# Patient Record
Sex: Female | Born: 1955
Health system: Southern US, Community
[De-identification: ages and names within clinical notes are randomized; demographics above are authoritative.]

## PROBLEM LIST (undated history)

## (undated) DIAGNOSIS — E7800 Pure hypercholesterolemia, unspecified: Secondary | ICD-10-CM

## (undated) DIAGNOSIS — F419 Anxiety disorder, unspecified: Secondary | ICD-10-CM

## (undated) DIAGNOSIS — M25561 Pain in right knee: Secondary | ICD-10-CM

## (undated) DIAGNOSIS — F329 Major depressive disorder, single episode, unspecified: Secondary | ICD-10-CM

## (undated) DIAGNOSIS — C50919 Malignant neoplasm of unspecified site of unspecified female breast: Secondary | ICD-10-CM

## (undated) DIAGNOSIS — F32A Depression, unspecified: Secondary | ICD-10-CM

## (undated) DIAGNOSIS — M1990 Unspecified osteoarthritis, unspecified site: Secondary | ICD-10-CM

## (undated) HISTORY — PX: MASTECTOMY: SHX3

## (undated) HISTORY — PX: BREAST LUMPECTOMY WITH AXILLARY LYMPH NODE BIOPSY: SHX5593

## (undated) HISTORY — DX: Malignant neoplasm of unspecified site of unspecified female breast: C50.919

## (undated) HISTORY — DX: Pain in right knee: M25.561

---

## 2011-12-07 DIAGNOSIS — C50919 Malignant neoplasm of unspecified site of unspecified female breast: Secondary | ICD-10-CM

## 2011-12-07 HISTORY — DX: Malignant neoplasm of unspecified site of unspecified female breast: C50.919

## 2011-12-29 DIAGNOSIS — C50911 Malignant neoplasm of unspecified site of right female breast: Secondary | ICD-10-CM | POA: Insufficient documentation

## 2012-04-10 ENCOUNTER — Telehealth: Payer: Self-pay | Admitting: *Deleted

## 2012-04-10 NOTE — Telephone Encounter (Signed)
Patient has moved her to the [place] and needs to obtain a Med Onc.  Dwaine Gale referred her to [place].  Patient has no insurance and I referred her to call Jerilynn Som for assistance.  Confirmed 05/12/12 appt w/ pt.  Mailed before appt letter & packet to pt.  Took paperwork to Med Rec for chart.

## 2012-04-18 ENCOUNTER — Other Ambulatory Visit: Payer: Self-pay | Admitting: *Deleted

## 2012-04-18 DIAGNOSIS — C50919 Malignant neoplasm of unspecified site of unspecified female breast: Secondary | ICD-10-CM

## 2012-04-29 ENCOUNTER — Encounter: Payer: Self-pay | Admitting: Oncology

## 2012-04-29 NOTE — Progress Notes (Signed)
Patient came in prior to 1st visit. I advised her of medicaid and gave her an application and directions to DSS to turn in the form. 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  We have spelled her name incorrectly in system(Gina Johns).  I advised her would correct to Gina Johns. I also gave her our application also. They have no insurance and is worried about the visit.

## 2012-05-12 ENCOUNTER — Ambulatory Visit (HOSPITAL_BASED_OUTPATIENT_CLINIC_OR_DEPARTMENT_OTHER): Payer: Self-pay | Admitting: Oncology

## 2012-05-12 ENCOUNTER — Other Ambulatory Visit (HOSPITAL_BASED_OUTPATIENT_CLINIC_OR_DEPARTMENT_OTHER): Payer: Self-pay | Admitting: Lab

## 2012-05-12 ENCOUNTER — Ambulatory Visit: Payer: Self-pay

## 2012-05-12 ENCOUNTER — Encounter: Payer: Self-pay | Admitting: Oncology

## 2012-05-12 VITALS — BP 131/85 | HR 76 | Temp 97.8°F | Resp 20 | Ht 64.0 in | Wt 159.8 lb

## 2012-05-12 DIAGNOSIS — Z170 Estrogen receptor positive status [ER+]: Secondary | ICD-10-CM

## 2012-05-12 DIAGNOSIS — IMO0001 Reserved for inherently not codable concepts without codable children: Secondary | ICD-10-CM

## 2012-05-12 DIAGNOSIS — I890 Lymphedema, not elsewhere classified: Secondary | ICD-10-CM

## 2012-05-12 DIAGNOSIS — C50911 Malignant neoplasm of unspecified site of right female breast: Secondary | ICD-10-CM

## 2012-05-12 DIAGNOSIS — C50919 Malignant neoplasm of unspecified site of unspecified female breast: Secondary | ICD-10-CM

## 2012-05-12 LAB — CBC WITH DIFFERENTIAL/PLATELET
Basophils Absolute: 0 10*3/uL (ref 0.0–0.1)
Eosinophils Absolute: 0.1 10*3/uL (ref 0.0–0.5)
HCT: 38.5 % (ref 34.8–46.6)
HGB: 12.9 g/dL (ref 11.6–15.9)
LYMPH%: 23 % (ref 14.0–49.7)
MCV: 77.9 fL — ABNORMAL LOW (ref 79.5–101.0)
MONO#: 0.4 10*3/uL (ref 0.1–0.9)
MONO%: 6 % (ref 0.0–14.0)
NEUT#: 4.8 10*3/uL (ref 1.5–6.5)
NEUT%: 69.6 % (ref 38.4–76.8)
Platelets: 253 10*3/uL (ref 145–400)
RBC: 4.95 10*6/uL (ref 3.70–5.45)
WBC: 6.9 10*3/uL (ref 3.9–10.3)

## 2012-05-12 LAB — COMPREHENSIVE METABOLIC PANEL (CC13)
Alkaline Phosphatase: 70 U/L (ref 40–150)
BUN: 16 mg/dL (ref 7.0–26.0)
CO2: 26 mEq/L (ref 22–29)
Glucose: 114 mg/dl — ABNORMAL HIGH (ref 70–99)
Total Bilirubin: 0.57 mg/dL (ref 0.20–1.20)
Total Protein: 7.2 g/dL (ref 6.4–8.3)

## 2012-05-12 NOTE — Progress Notes (Signed)
Gina Johns [id number] May 30, 1956 56 y.o. 05/12/2012 3:49 PM  CC No primary provider on file. No primary provider on file.  REASON FOR CONSULTATION:  56 year old female with invasive ductal carcinoma of the right breast diagnosed in June 2013 in [place]. Patient is now establishing her care at [facility].   STAGE:  Clinical stage II (T2 N0)  REFERRING PHYSICIAN: Patient is self-referred  HISTORY OF PRESENT ILLNESS:  Gina Johns is a 56 y.o. female.  Without significant past medical history. Patient had her first mammogram one year ago which was normal. Patient travel to [place] over the summer and noted a mass in the right upper quadrant of her right breast. 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 On mammogram the mass measured about 2 x 2 by 1.5 cm. Patient went on to have a lump the mean with axillary lymph node dissection on 11/30/2011. The final pathology revealed a 2.0 cm invasive ductal carcinoma that was grade 2:15 axillary lymph nodes were negative for metastatic disease. Tumor was ER positive PR positive HER-2/neu negative Ki-67 10%. Postoperatively patient did receive adjuvant radiation therapy. She was then begun on letrozole 2.5 mg daily she has been on this now for about 3 months. She did notice right upper extremity lymphedema. Because of this she self referred herself to the lymphedema clinic on [facility]. She currently has a lymphedema sleeve that she wears periodically. Since being on the letrozole she does complain of significant aches and pains especially in her knees in the right. She is having great deal of difficulty kneeling. She is also having mood swings hot flashes. Patient is now seen in oncology for ongoing care   Past Medical History: No past medical history on file.  Past  Surgical History: C-section x 2   Family History: No history of breas,t ovarian, colon  Social History History  Substance Use Topics  . Smoking status: Not on file  . Smokeless tobacco: Not on file  . Alcohol Use: Not on file    Allergies: No Known Allergies  Current Medications: Current Outpatient Prescriptions  Medication Sig Dispense Refill  . letrozole (FEMARA) 2.5 MG tablet Take 2.5 mg by mouth daily.        OB/GYN History:menarache at 15, menopause at 49, G4 P2 first live born at 56, HRT,  Fertility Discussion:N/A Prior History of Cancer: none  Health Maintenance:  Colonoscopy none Bone Density yes  Last PAP smear 3 years Mammogram: first mammogram at 37  ECOG PERFORMANCE STATUS: 0 - Asymptomatic  Genetic Counseling/testing: n/A  REVIEW OF SYSTEMS:  A comprehensive review of systems was negative except for: Integument/breast: positive for Patient's right breast reveals a well-healed incisional scar in the upper outer quadrant the right breast skin does show changes with darkening of the skin there is no nipple discharge no palpable masses the right breast is more swollen in comparison to the left breast there is palpable seroma Musculoskeletal: positive for arthralgias, bone pain, myalgias, stiff joints and Patient has joint stiffness in her right knee. She also is noted to have right upper upper extremity swelling as well. Behavioral/Psych: positive for anxiety and mood swings  PHYSICAL EXAMINATION: Blood pressure 131/85, pulse 76, temperature 97.8 F (36.6 C), temperature source Oral, resp. rate 20, height 5\' 4"  (1.626 m), weight 159 lb 12.8 oz (72.485 kg).  WGN:FAOZH, healthy, no distress, well nourished and well developed SKIN: skin color, texture, turgor are normal, no rashes or significant lesions HEAD: Normocephalic EYES: PERRLA, EOMI EARS: External ears normal OROPHARYNX:no exudate, no erythema and lips, buccal mucosa, and tongue normal  NECK:  supple, no adenopathy, no JVD LYMPH:  no palpable lymphadenopathy, no hepatosplenomegaly BREAST: Right breast reveals well-healed excisional scar seroma is palpable no nipple discharge left breast no masses or nipple discharge. LUNGS: clear to auscultation and percussion HEART: regular rate & rhythm, no murmurs and no gallops ABDOMEN:abdomen soft, non-tender, normal bowel sounds and no masses or organomegaly BACK: Back symmetric, no curvature., No CVA tenderness, Range of motion is normal EXTREMITIES:less then 2 second capillary refill, no edema, no clubbing, no cyanosis  NEURO: alert & oriented x 3 with fluent speech, no focal motor/sensory deficits, gait normal, reflexes normal and symmetric     STUDIES/RESULTS: No results found.   LABS:    Chemistry   No results found for this basename: NA, K, CL, CO2, BUN, CREATININE, GLU   No results found for this basename: CALCIUM, ALKPHOS, AST, ALT, BILITOT      Lab Results  Component Value Date   WBC 6.9 05/12/2012   HGB 12.9 05/12/2012   HCT 38.5 05/12/2012   MCV 77.9* 05/12/2012   PLT 253 05/12/2012         ASSESSMENT    56 year old female with  #1 T T2 N0 invasive ductal carcinoma (stage II) of the right breast status post right lumpectomy with right axillary lymph node dissection. Patient was found to have a 2.0 cm ER positive PR positive HER-2/neu negative breast cancer 15 lymph nodes were negative for metastatic disease. She is now status post adjuvant radiation therapy and then placed on adjuvant antiestrogen therapy with letrozole.  #2 myalgias and arthralgias and mood swings secondary to her antiestrogen therapy.  #3 patient and I had an extensive discussion regarding her diagnosis of breast cancer pathology and treatment that has been given to her. We discussed extensively antiestrogen therapy specifically letrozole and side effects associated with that. I think the myalgias and arthralgias are due to the letrozole as  well as the mood swings and hot flashes. We did discuss the possibility of coming off of it for about 2 weeks. In starting a different agent if her symptoms resolve. However patient is going to [place] and at this time she wants to continue the letrozole.  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 We discussed referring her to the lymphedema clinic but again she is going to [place] and will pursue this referral after she returns.    PLAN:    #1 patient will continue letrozole for now for her aches and pains she will take ibuprofen or Tylenol. If her pains continue she will consulting physician in [place] regarding what to do next.  #2 she is encouraged to continue using her lymphedema sleeve.  #3 I will see her back in January when she returns from her trip.        Thank you so much for allowing me to participate in the care of Dorianne Krupp. I will continue to follow up the patient with you and assist in her care.  All questions were answered. The patient knows to call the clinic with any problems, questions or concerns. We can certainly see the patient much sooner if necessary.  I spent 60 minutes counseling the patient face to face. The total time spent in the appointment  was 60 minutes.   Drue Second, MD Medical/Oncology [facility] [phone] (beeper) [phone] (Office)  05/12/2012, 3:49 PM

## 2012-05-12 NOTE — Patient Instructions (Addendum)
We discussed breast cancer staging and treatment today  You are on femara 2.5 mg daily. We discussed the risks and benefits.  Take vitamin D3 5000iu daily  Take calcium (caltrate) daily Take tyelnol or Ibuprofen for the knee pain  We disucssed lymphedema and referral to the lymphedema clinic upon your return from [place]  I will see you back inJanuary when you return from [place]  Letrozole tablets What is this medicine? LETROZOLE (LET roe zole) blocks the production of estrogen. Certain types of breast cancer grow under the influence of estrogen. Letrozole helps block tumor growth. This medicine is used to treat advanced breast cancer in postmenopausal women. This medicine may be used for other purposes; ask your health care provider or pharmacist if you have questions. What should I tell my health care provider before I take this medicine? They need to know if you have any of these conditions: -liver disease -osteoporosis (weak bones) -an unusual or allergic reaction to letrozole, other medicines, foods, dyes, or preservatives -pregnant or trying to get pregnant -breast-feeding How should I use this medicine? Take this medicine by mouth with a glass of water. You may take it with or without food. Follow the directions on the prescription label. Take your medicine at regular intervals. Do not take your medicine more often than directed. Do not stop taking except on your doctor's advice. Talk to your pediatrician regarding the use of this medicine in children. Special care may be needed. Overdosage: If you think you have taken too much of this medicine contact a poison control center or emergency room at once. NOTE: This medicine is only for you. Do not share this medicine with others. What if I miss a dose? If you miss a dose, take it as soon as you can. If it is almost time for your next dose, take only that dose. Do not take double or extra doses. What may interact with this  medicine? Do not take this medicine with any of the following medications: -estrogens, like hormone replacement therapy or birth control pills This medicine may also interact with the following medications: -dietary supplements such as androstenedione or DHEA -prasterone -tamoxifen This list may not describe all possible interactions. Give your health care provider a list of all the medicines, herbs, non-prescription drugs, or dietary supplements you use. Also tell them if you smoke, drink alcohol, or use illegal drugs. Some items may interact with your medicine. What should I watch for while using this medicine? Visit your doctor or health care professional for regular check-ups to monitor your condition. Do not use this drug if you are pregnant. Serious side effects to an unborn child are possible. Talk to your doctor or pharmacist for more information. You may get drowsy or dizzy. Do not drive, use machinery, or do anything that needs mental alertness until you know how this medicine affects you. Do not stand or sit up quickly, especially if you are an older patient. This reduces the risk of dizzy or fainting spells. What side effects may I notice from receiving this medicine? Side effects that you should report to your doctor or health care professional as soon as possible: -allergic reactions like skin rash, itching, or hives -bone fracture -chest pain -difficulty breathing or shortness of breath -severe pain, swelling, warmth in the leg -unusually weak or tired -vaginal bleeding Side effects that usually do not require medical attention (report to your doctor or health care professional if they continue or are bothersome): -bone, back, joint,  or muscle pain -dizziness -fatigue -fluid retention -headache -hot flashes, night sweats -nausea -weight gain This list may not describe all possible side effects. Call your doctor for medical advice about side effects. You may report side  effects to FDA at 1-800-FDA-1088. Where should I keep my medicine? Keep out of the reach of children. Store between 15 and 30 degrees C (59 and 86 degrees F). Throw away any unused medicine after the expiration date. NOTE: This sheet is a summary. It may not cover all possible information. If you have questions about this medicine, talk to your doctor, pharmacist, or health care provider.  2013, Elsevier/[employer] Standard. (08/22/2007 4:43:44 PM)

## 2012-05-21 ENCOUNTER — Encounter: Payer: Self-pay | Admitting: *Deleted

## 2012-05-21 NOTE — Progress Notes (Signed)
Mailed after appt letter to pt. 

## 2012-05-26 ENCOUNTER — Encounter: Payer: Self-pay | Admitting: Oncology

## 2012-05-26 NOTE — Progress Notes (Signed)
All information as well as application for assistance is no longer valid. She will have to fill out a new application as well as provide all new information. Date of application is 05/12/2012. All banking information is from Oct. All shredded.

## 2012-07-01 ENCOUNTER — Encounter: Payer: Self-pay | Admitting: Oncology

## 2012-07-01 NOTE — Progress Notes (Signed)
Hubby came in and inquiring about getting financial assistance for wife. I gave him application and I made copies of bank statement and her pay stubs. He does not work. He has to get copy of denial from medicaid also. He had form that they withdrew from process. It doesn't say she was denied. I advised him need to call case worker and get that copy to bring back with application.

## 2012-07-02 ENCOUNTER — Telehealth: Payer: Self-pay | Admitting: Oncology

## 2012-07-02 NOTE — Telephone Encounter (Signed)
Per Tami changed 1/22 appt to 9:15am. S/w pt relative re new time.

## 2012-07-14 ENCOUNTER — Telehealth: Payer: Self-pay | Admitting: Oncology

## 2012-07-14 NOTE — Telephone Encounter (Signed)
Pt husband called to cx appt. Husband does not wish to r/s now and states he will call back to r/s.

## 2012-07-16 ENCOUNTER — Ambulatory Visit: Payer: Self-pay | Admitting: Oncology

## 2012-07-21 ENCOUNTER — Encounter: Payer: Self-pay | Admitting: Oncology

## 2012-07-21 NOTE — Progress Notes (Signed)
Processed the patient's financial application. 25family income [id number]. She is 80% indigent 07/21/12-01/18/13. I will send the partial letter and green card to the patient. All has been noted and documents scanned into the system.

## 2012-07-28 ENCOUNTER — Encounter: Payer: Self-pay | Admitting: Oncology

## 2012-07-28 NOTE — Progress Notes (Signed)
Hubby bought back statement from bank, that shows they cashed in the CD and sent to son for college tuition. They want the application for assistance to be reviewed again since bank information has changed. I advised would get reviewed to see if can get more of a discount than 80%.

## 2012-08-14 ENCOUNTER — Encounter: Payer: Self-pay | Admitting: Oncology

## 2012-08-14 NOTE — Progress Notes (Signed)
I re-done her application for assistance. Her and hubby bought back in proof that the CD was cashed for son's tuition. They also sent proof that they are getting assistance in paying their mortgage(1635.00). Previous discount was 80%. With new information I updated to 100% indigent 08/14/12-02/11/13. I will send letter and card to the patient. I emailed the hubby to advise him to make sure he calls and get the bills discounted and to send me the bill for [facility] if they have one, so I can fax to them and get paid.

## 2012-08-20 ENCOUNTER — Telehealth: Payer: Self-pay | Admitting: Oncology

## 2012-08-20 NOTE — Telephone Encounter (Signed)
Pt's husband stopped by to schedule an due to some leg pain pt is having. Pt missed 1/22 appt. No availability - message to KK. Husband aware he will be contacted w/appt. Home number changed per husband and is listed in EPIC.

## 2012-08-26 ENCOUNTER — Other Ambulatory Visit: Payer: Self-pay | Admitting: Medical Oncology

## 2012-08-26 ENCOUNTER — Encounter: Payer: Self-pay | Admitting: Gynecologic Oncology

## 2012-08-26 ENCOUNTER — Emergency Department (INDEPENDENT_AMBULATORY_CARE_PROVIDER_SITE_OTHER)
Admission: EM | Admit: 2012-08-26 | Discharge: 2012-08-26 | Disposition: A | Discharge status: Home / Self Care | Payer: No Typology Code available for payment source | Source: Home / Self Care | Attending: Family Medicine | Admitting: Family Medicine

## 2012-08-26 ENCOUNTER — Ambulatory Visit (HOSPITAL_BASED_OUTPATIENT_CLINIC_OR_DEPARTMENT_OTHER): Payer: No Typology Code available for payment source | Admitting: Gynecologic Oncology

## 2012-08-26 ENCOUNTER — Encounter (HOSPITAL_COMMUNITY): Payer: Self-pay | Admitting: Emergency Medicine

## 2012-08-26 ENCOUNTER — Telehealth: Payer: Self-pay | Admitting: Oncology

## 2012-08-26 VITALS — BP 155/90 | HR 69 | Temp 97.0°F | Wt 159.4 lb

## 2012-08-26 DIAGNOSIS — M25561 Pain in right knee: Secondary | ICD-10-CM

## 2012-08-26 DIAGNOSIS — M25569 Pain in unspecified knee: Secondary | ICD-10-CM

## 2012-08-26 DIAGNOSIS — I890 Lymphedema, not elsewhere classified: Secondary | ICD-10-CM

## 2012-08-26 DIAGNOSIS — C50919 Malignant neoplasm of unspecified site of unspecified female breast: Secondary | ICD-10-CM

## 2012-08-26 DIAGNOSIS — IMO0001 Reserved for inherently not codable concepts without codable children: Secondary | ICD-10-CM

## 2012-08-26 HISTORY — DX: Pain in right knee: M25.561

## 2012-08-26 MED ORDER — DICLOFENAC SODIUM 75 MG PO TBEC: 75.0000 mg | DELAYED_RELEASE_TABLET | Freq: Two times a day (BID) | ORAL | Status: DC

## 2012-08-26 NOTE — ED Notes (Signed)
Pt c/o right knee pain for  Several months that is a constant dull aching pain but over the past several days pain has become more severe.  Pt states that when she was in her home country [place] and xray were done but could not find anything wrong.  Pt has concerns that it is in connection with treatment of breast cancer in 11/2011.

## 2012-08-26 NOTE — Telephone Encounter (Signed)
gv pt appt schedule for June. Per 3/4 pof pt needs appt @ lymphedema clinic. No referral entered. S/w Harriett Sine @ lymphedema clinic and faxed 3/4 pof. Clinic will call pt w/appt. Pt aware and has info for lymphedema clinic.

## 2012-08-26 NOTE — Progress Notes (Signed)
Gina Johns [id number] 1955-07-28 56 y.o. 08/26/2012 7:15 PM  CC No primary Sammie Denner on file.  STAGE:  Clinical stage II (T2 N0)  REFERRING PHYSICIAN: Patient is self-referred  HISTORY OF PRESENT ILLNESS: Gina Johns is a 57 y.o. female without significant past medical history. Patient had her first mammogram two years ago, which was normal.  The patient traveled to [place] over the summer and noted a mass in the right upper quadrant of her right breast. 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  The tumor was ER positive, PR positive, HER-2/neu negative. On mammogram, the mass measured about 2 x 2 by 1.5 cm.  She then went on to have a lumpectomy with axillary lymph node dissection on 11/30/2011. The final pathology revealed a 2.0 cm invasive ductal carcinoma that was grade 2 with axillary lymph nodes negative for metastatic disease. The tumor was ER positive, PR positive, HER-2/neu negative, Ki-67 10%. Postoperatively, the patient did receive adjuvant radiation therapy. She then started letrozole 2.5 mg daily. She did notice right upper extremity lymphedema and she self referred herself to the lymphedema clinic on [facility].   INTERVAL HISTORY: She presents today as a walk in with right knee pain and right upper extremity swelling.  She reports experiencing moderate pain in the right knee that has worsen over the past several months.  She denies injury to the knee or a recent fall.  She reports having an MRI of the right knee in [place] in December 2013 with no significant findings.  She denies swelling or erythema in the right lower extremity.  No issues voiced with the left knee.  She denies trying pharmacologic and non-pharmacologic measures for relief of the right knee pain.  She voices concerns about right upper extremity edema but denies following up with the  recommendation to attend the lymphedema clinic for evaluation.  She currently has a lymphedema sleeve that she wears periodically.  She reports that after her last visit with Dr. Welton Flakes, she stopped taking Letrozole for 6 days to see if the right knee pain would improve and she could not tell a difference so she restarted the medication.     Past Medical History: Past Medical History  Diagnosis Date  . Breast cancer 12/07/2011    right breast 2x3 cm    Past Surgical History: C-section x 2   Family History: No history of breast, ovarian, or colon cancer  Social History History  Substance Use Topics  . Smoking status: Never Smoker   . Smokeless tobacco: Never Used  . Alcohol Use: No    Allergies: No Known Allergies  Current Medications: Current Outpatient Prescriptions  Medication Sig Dispense Refill  . letrozole (FEMARA) 2.5 MG tablet Take 2.5 mg by mouth daily.      . diclofenac (VOLTAREN) 75 MG EC tablet Take 1 tablet (75 mg total) by mouth 2 (two) times daily.  20 tablet  1  . Nutritional Supplements (VITAMIN D MAINTENANCE PO) Take by mouth.       No current facility-administered medications for this visit.    OB/GYN History:menarache at 15, menopause at 89, G4 P2 first live born at 20, HRT,  Fertility Discussion:N/A Prior History of Cancer: none  Health Maintenance: Colonoscopy none Bone Density yes  Last PAP smear 3 years Mammogram: first mammogram at 66  ECOG PERFORMANCE STATUS: Symptomatic but completely ambulatory  Genetic Counseling/testing:  n/A  REVIEW OF SYSTEMS: Constitutional: Feels anxious about moderate right knee pain and right upper extremity edema Musculoskeletal: Positive for right knee pain and right upper extremity edema. Neurologic: No weakness, numbness, or change in gait.  Psychology: No depression or insomnia.  Positive for anxiety.  PHYSICAL EXAMINATION: Blood pressure 155/90, pulse 69, temperature 97 F (36.1 C), temperature source  Oral, weight 159 lb 6 oz (72.292 kg).  GENERAL:  Well developed, well nourished female in no acute distress.  Alert and oriented x3. EXTREMITIES:  RIght knee mildly larger than the left.  Tender on palpation to the left of the patella. No erythema noted.  Mild edema noted posteriorly with the right knee.  Full range of motion with the right knee.  No edema in the lower extremities bilaterally.  Right extremity noted with mild generalized edema.  Slightly larger than the left arm.  Full range of motion noted.  No erythema.  No clubbing or cyanosis.  NEURO: No focal motor/sensory deficits, gait normal, reflexes normal and symmetric  STUDIES/RESULTS: No results found.   LABS:    Chemistry      Component Value Date/Time   NA 139 05/12/2012 1441      Component Value Date/Time   CALCIUM 9.7 05/12/2012 1441      Lab Results  Component Value Date   WBC 6.9 05/12/2012   HGB 12.9 05/12/2012   HCT 38.5 05/12/2012   MCV 77.9* 05/12/2012   PLT 253 05/12/2012    ASSESSMENT    57 year old female with  #1 T T2 N0 invasive ductal carcinoma (stage II) of the right breast status post right lumpectomy with right axillary lymph node dissection. Patient was found to have a 2.0 cm ER positive PR positive HER-2/neu negative breast cancer 15 lymph nodes were negative for metastatic disease. She is now status post adjuvant radiation therapy and then placed on adjuvant antiestrogen therapy with letrozole.  #2 myalgias and arthralgias and mood swings secondary to her antiestrogen therapy.  #3 right knee pain worsening over the past several months.  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     PLAN:   #1  She is encouraged to call [facility] to arrange for a new patient appointment to become established with a primary care Braxtyn Bojarski in order to manage her right knee pain.  The patient will continue letrozole for now and for her aches and pains she will take  ibuprofen or Tylenol.   #2  She is once again advised to follow up at the lymphedema clinic and she is encouraged to continue using her lymphedema sleeve.  #3  She is to follow up with Dr. Welton Flakes in three to four months or sooner if problems arise.   All questions were answered. The patient knows to call the clinic with any problems, questions or concerns. We can certainly see the patient much sooner if necessary.  The patient was reviewed with Dr Welton Flakes, who agrees with the above plan.  Warner Mccreedy, NP Medical/Oncology [facility] [phone] (Office)  08/26/2012, 7:15 PM

## 2012-08-26 NOTE — ED Provider Notes (Signed)
History     CSN: [id number]  Arrival date & time 08/26/12  1532   None     Chief Complaint  Patient presents with  . Knee Pain    right knee pain for several months.     (Consider location/radiation/quality/duration/timing/severity/associated sxs/prior treatment) Patient is a 57 y.o. female presenting with knee pain. The history is provided by the patient. No language interpreter was used.  Knee Pain Location:  Knee Knee location:  R knee Pain details:    Quality:  Aching   Radiates to:  Does not radiate   Severity:  Moderate   Onset quality:  Gradual   Duration:  8 months   Timing:  Constant   Progression:  Worsening Chronicity:  Chronic Worsened by:  Activity Ineffective treatments:  None tried Pt sent here by Dr. Milta Deiters office for referrral to orthopaedist.  Pt has a green card and told she needed to come here to establish for referral  (possibly acute care clinic)  Past Medical History  Diagnosis Date  . Breast cancer 12/07/2011    right breast 2x3 cm    Past Surgical History  Procedure Laterality Date  . Breast lumpectomy with axillary lymph node biopsy      Family History  Problem Relation Age of Onset  . Cancer Sister 24    acute leukemia    History  Substance Use Topics  . Smoking status: Never Smoker   . Smokeless tobacco: Never Used  . Alcohol Use: No    OB History   Grav Para Term Preterm Abortions TAB SAB Ect Mult Living                  Review of Systems  Musculoskeletal: Positive for myalgias and joint swelling.  All other systems reviewed and are negative.    Allergies  Review of patient's allergies indicates no known allergies.  Home Medications   Current Outpatient Rx  Name  Route  Sig  Dispense  Refill  . letrozole (FEMARA) 2.5 MG tablet   Oral   Take 2.5 mg by mouth daily.         . Nutritional Supplements (VITAMIN D MAINTENANCE PO)   Oral   Take by mouth.           BP 131/92  Pulse 69  Temp(Src) 97.4 F  (36.3 C) (Oral)  Resp 16  SpO2 100%  Physical Exam  Vitals reviewed. Constitutional: She appears well-developed and well-nourished.  HENT:  Head: Normocephalic and atraumatic.  Musculoskeletal: She exhibits tenderness.  Small effusion no instability  Neurological: She is alert.  Skin: Skin is warm.    ED Course  Procedures (including critical care time)  Labs Reviewed - No data to display No results found.   1. Right knee pain       MDM  Pt's info given to acute care clinic,  They will make orthopaedist referral        Elson Areas, PA-C 08/26/12 1733  Lonia Skinner [place], [place] 08/26/12 1734

## 2012-08-26 NOTE — Patient Instructions (Signed)
Follow up with your primary care physician about right knee pain and to arrange a possible orthopedic consult.  Plan to see Dr. Welton Flakes in 3 to 4 months and follow up at the lymphedema clinic.  Please call for any questions or concerns.

## 2012-08-27 NOTE — ED Provider Notes (Signed)
Medical screening examination/treatment/procedure(s) were performed by resident physician or non-physician practitioner and as supervising physician I was immediately available for consultation/collaboration.   KINDL,JAMES DOUGLAS MD.   James D Kindl, MD 08/27/12 1827 

## 2012-08-28 ENCOUNTER — Encounter: Payer: Self-pay | Admitting: Sports Medicine

## 2012-08-28 ENCOUNTER — Ambulatory Visit (INDEPENDENT_AMBULATORY_CARE_PROVIDER_SITE_OTHER): Payer: No Typology Code available for payment source | Admitting: Sports Medicine

## 2012-08-28 VITALS — BP 122/78 | HR 72 | Ht 64.0 in | Wt 159.0 lb

## 2012-08-28 DIAGNOSIS — M25569 Pain in unspecified knee: Secondary | ICD-10-CM

## 2012-08-28 DIAGNOSIS — M25561 Pain in right knee: Secondary | ICD-10-CM

## 2012-08-28 DIAGNOSIS — M2240 Chondromalacia patellae, unspecified knee: Secondary | ICD-10-CM

## 2012-08-28 NOTE — Patient Instructions (Addendum)
Please start taking 2000 mg of calcium daily  Ok to use over the counter aspercream instead of diclofenac  Please follow up in 3 weeks  Thank you for seeing [place] today!

## 2012-08-31 NOTE — Progress Notes (Signed)
  Subjective:    Patient ID: Gina Johns, female    DOB: 29-Jun-1955, 57 y.o.   MRN: [id number]  HPI chief complaint: Right knee pain  Very pleasant 57 year old female comes in today complaining of 6 months of right knee pain. No specific injury but rather a gradual onset of pain is along the medial knee. Her symptoms are worse with standing and walking. Intermittent swelling. No mechanical symptoms. She is from [place] and [place] she had an MRI of this right knee. The report is available for review. She's been treated with relative rest and Naprosyn without much improvement. She denies significant problems with this knee in the past. No prior knee surgeries. No groin pain. While in [place] she also had her D3 level checked which was low at 8.02. She has since started supplemental vitamin D. She is here today with her husband.  Medical and surgical history are significant for breast cancer treated in June 2013. She is currently followed by Dr.Khan. Medications include Femara, cholecalciferol powder, vitamin D, ranitidine. She is also using topical Voltaren for her knee pain. No known drug allergies    Review of Systems     Objective:   Physical Exam Well-developed, well-nourished. No acute distress. Awake alert and oriented x3  Right knee: Full range of motion with no effusion. She is tender to palpation along the medial joint line with pain but no popping with McMurray's. No tenderness along the lateral joint line. 1+ patellofemoral crepitus with active extension. Minimal pain with patellar compression testing. Knee is stable to valgus and varus stressing. Negative anterior drawer, negative posterior drawer. I do not appreciate any fullness in the popliteal fossa. She is neurovascularly intact distally. Walks with a minimal limp.  MRI report from [place] dated 06/20/2012 is available for review. Per the report, patient has chondromalacia patella and  degenerative changes in the body and posterior horn of the medial meniscus but no discrete tear. There may also be a small ganglion cyst and a tiny popliteal cyst. Articular cartilage is well preserved.       Assessment & Plan:  1. Right knee pain secondary to degenerated medial meniscus 2. Chondromalacia patella right knee  Although the MRI does not mention any discrete medial meniscal tear, there is always the possibility of an occult degenerative tear even with a "normal "MRI. Nonetheless, it does not change my treatment recommendation at this point in time. I recommended a single intra-articular cortisone injection coupled with [facility]. I also think she would benefit from a body helix compression sleeve. She will followup with me in 3-4 weeks. If symptoms persist and warrant despite conservative treatment we could consider an orthopedic surgical consult down the road for a diagnostic arthroscopy. Patient and her husband are encouraged to call with questions or concerns in the interim. She will continue with Voltaren cream when necessary or alternatively she can use over-the-counter Aspercreme.  Of note, I've also recommended that the patient to 2000 mg of calcium daily in addition to 800 international units of vitamin D.  Consent obtained and verified. Time-out conducted. Noted no overlying erythema, induration, or other signs of local infection. Skin prepped in a sterile fashion. Topical analgesic spray: Ethyl chloride. Joint: right knee Needle: 25g 11/2 inch Completed without difficulty. Meds: 3cc 1% lidocaine, 1cc (40mg ) depomedrol  Advised to call if fevers/chills, erythema, induration, drainage, or persistent bleeding.

## 2012-09-10 ENCOUNTER — Ambulatory Visit: Payer: No Typology Code available for payment source | Attending: Sports Medicine | Admitting: Physical Therapy

## 2012-09-10 DIAGNOSIS — IMO0001 Reserved for inherently not codable concepts without codable children: Secondary | ICD-10-CM | POA: Insufficient documentation

## 2012-09-10 DIAGNOSIS — M25569 Pain in unspecified knee: Secondary | ICD-10-CM | POA: Insufficient documentation

## 2012-09-15 ENCOUNTER — Ambulatory Visit (INDEPENDENT_AMBULATORY_CARE_PROVIDER_SITE_OTHER): Payer: No Typology Code available for payment source | Admitting: Sports Medicine

## 2012-09-15 ENCOUNTER — Encounter: Payer: Self-pay | Admitting: Sports Medicine

## 2012-09-15 VITALS — BP 128/83 | HR 77 | Ht 64.0 in | Wt 159.0 lb

## 2012-09-15 DIAGNOSIS — M25561 Pain in right knee: Secondary | ICD-10-CM

## 2012-09-15 DIAGNOSIS — M25569 Pain in unspecified knee: Secondary | ICD-10-CM

## 2012-09-16 NOTE — Progress Notes (Signed)
  Subjective:    Patient ID: Gina Johns, female    DOB: 1956/05/12, 57 y.o.   MRN: [id number]  HPI Patient comes in today for followup on right knee pain. It should be noted that I incorrectly stated in her last office visit that she received a cortisone injection into this knee. We instead referred her to physical therapy where she has had only a single visit. Still experiencing pain especially with weightbearing. Intermittent swelling.    Review of Systems     Objective:   Physical Exam Full range of motion. Trace effusion. Mild tenderness along medial and lateral joint lines with pain but no popping with McMurray's. Knee is stable to ligamentous exam. Neurovascularly intact distally. Walking with a slight limp.       Assessment & Plan:  1. Right knee pain likely secondary to occult meniscal tear  I once again discussed the merits of an intra-articular cortisone injection but the patient would like to hold on that for now. He would like to try a little more physical therapy. She will return to the office in 3 weeks. She understands that if symptoms persist that we can reconsider a cortisone injection. Call with questions or concerns in the interim.

## 2012-09-17 ENCOUNTER — Ambulatory Visit: Payer: No Typology Code available for payment source | Admitting: Physical Therapy

## 2012-09-18 ENCOUNTER — Encounter: Payer: No Typology Code available for payment source | Admitting: Physical Therapy

## 2012-09-18 ENCOUNTER — Ambulatory Visit: Payer: No Typology Code available for payment source | Admitting: Physical Therapy

## 2012-09-24 ENCOUNTER — Ambulatory Visit: Payer: No Typology Code available for payment source

## 2012-09-24 ENCOUNTER — Ambulatory Visit: Payer: No Typology Code available for payment source | Attending: Sports Medicine | Admitting: Physical Therapy

## 2012-09-24 DIAGNOSIS — M25569 Pain in unspecified knee: Secondary | ICD-10-CM | POA: Insufficient documentation

## 2012-09-24 DIAGNOSIS — IMO0001 Reserved for inherently not codable concepts without codable children: Secondary | ICD-10-CM | POA: Insufficient documentation

## 2012-09-29 ENCOUNTER — Ambulatory Visit: Payer: No Typology Code available for payment source | Admitting: *Deleted

## 2012-09-29 ENCOUNTER — Ambulatory Visit: Payer: No Typology Code available for payment source | Admitting: Physical Therapy

## 2012-10-01 ENCOUNTER — Ambulatory Visit: Payer: No Typology Code available for payment source | Admitting: Physical Therapy

## 2012-10-02 ENCOUNTER — Ambulatory Visit: Payer: No Typology Code available for payment source | Admitting: Sports Medicine

## 2012-10-02 ENCOUNTER — Ambulatory Visit: Payer: No Typology Code available for payment source

## 2012-10-06 ENCOUNTER — Ambulatory Visit: Payer: No Typology Code available for payment source | Admitting: Physical Therapy

## 2012-10-08 ENCOUNTER — Ambulatory Visit: Payer: No Typology Code available for payment source | Admitting: Physical Therapy

## 2012-10-13 ENCOUNTER — Encounter: Payer: Self-pay | Admitting: Physical Therapy

## 2012-10-14 ENCOUNTER — Ambulatory Visit: Payer: No Typology Code available for payment source | Admitting: Physical Therapy

## 2012-10-16 ENCOUNTER — Ambulatory Visit: Payer: No Typology Code available for payment source | Admitting: Physical Therapy

## 2012-10-20 ENCOUNTER — Ambulatory Visit: Payer: No Typology Code available for payment source | Admitting: Physical Therapy

## 2012-10-22 ENCOUNTER — Ambulatory Visit: Payer: No Typology Code available for payment source | Admitting: Physical Therapy

## 2012-10-27 ENCOUNTER — Ambulatory Visit: Payer: No Typology Code available for payment source | Admitting: Sports Medicine

## 2012-10-27 ENCOUNTER — Ambulatory Visit: Payer: No Typology Code available for payment source | Attending: Sports Medicine | Admitting: Physical Therapy

## 2012-10-27 DIAGNOSIS — M25569 Pain in unspecified knee: Secondary | ICD-10-CM | POA: Insufficient documentation

## 2012-10-27 DIAGNOSIS — IMO0001 Reserved for inherently not codable concepts without codable children: Secondary | ICD-10-CM | POA: Insufficient documentation

## 2012-10-29 ENCOUNTER — Ambulatory Visit: Payer: No Typology Code available for payment source | Admitting: Physical Therapy

## 2012-10-30 ENCOUNTER — Ambulatory Visit: Payer: No Typology Code available for payment source | Admitting: Physical Therapy

## 2012-11-04 ENCOUNTER — Ambulatory Visit: Payer: No Typology Code available for payment source | Admitting: Physical Therapy

## 2012-11-06 ENCOUNTER — Ambulatory Visit: Payer: No Typology Code available for payment source | Admitting: Physical Therapy

## 2012-11-11 ENCOUNTER — Ambulatory Visit: Payer: No Typology Code available for payment source | Admitting: Physical Therapy

## 2012-11-12 ENCOUNTER — Ambulatory Visit: Payer: No Typology Code available for payment source | Admitting: Physical Therapy

## 2012-11-13 ENCOUNTER — Encounter: Payer: No Typology Code available for payment source | Admitting: Physical Therapy

## 2012-11-13 ENCOUNTER — Ambulatory Visit: Payer: No Typology Code available for payment source | Admitting: Physical Therapy

## 2012-11-18 ENCOUNTER — Encounter: Payer: No Typology Code available for payment source | Admitting: Physical Therapy

## 2012-11-18 ENCOUNTER — Ambulatory Visit: Payer: No Typology Code available for payment source | Admitting: Physical Therapy

## 2012-11-20 ENCOUNTER — Encounter: Payer: No Typology Code available for payment source | Admitting: Physical Therapy

## 2012-11-21 ENCOUNTER — Ambulatory Visit: Payer: No Typology Code available for payment source

## 2012-11-25 ENCOUNTER — Encounter: Payer: No Typology Code available for payment source | Admitting: Physical Therapy

## 2012-11-27 ENCOUNTER — Encounter: Payer: No Typology Code available for payment source | Admitting: Physical Therapy

## 2012-12-05 ENCOUNTER — Ambulatory Visit: Payer: No Typology Code available for payment source | Attending: Sports Medicine

## 2012-12-05 DIAGNOSIS — IMO0001 Reserved for inherently not codable concepts without codable children: Secondary | ICD-10-CM | POA: Insufficient documentation

## 2012-12-05 DIAGNOSIS — M25569 Pain in unspecified knee: Secondary | ICD-10-CM | POA: Insufficient documentation

## 2012-12-18 ENCOUNTER — Encounter: Payer: Self-pay | Admitting: Oncology

## 2012-12-18 ENCOUNTER — Ambulatory Visit: Payer: No Typology Code available for payment source | Admitting: Lab

## 2012-12-18 ENCOUNTER — Telehealth: Payer: Self-pay | Admitting: Medical Oncology

## 2012-12-18 ENCOUNTER — Other Ambulatory Visit (HOSPITAL_BASED_OUTPATIENT_CLINIC_OR_DEPARTMENT_OTHER): Payer: No Typology Code available for payment source | Admitting: Lab

## 2012-12-18 ENCOUNTER — Ambulatory Visit (HOSPITAL_BASED_OUTPATIENT_CLINIC_OR_DEPARTMENT_OTHER): Payer: No Typology Code available for payment source | Admitting: Oncology

## 2012-12-18 VITALS — BP 119/75 | HR 64 | Temp 97.7°F | Resp 20 | Ht 64.0 in | Wt 156.7 lb

## 2012-12-18 DIAGNOSIS — C50911 Malignant neoplasm of unspecified site of right female breast: Secondary | ICD-10-CM

## 2012-12-18 DIAGNOSIS — C50919 Malignant neoplasm of unspecified site of unspecified female breast: Secondary | ICD-10-CM

## 2012-12-18 LAB — CBC WITH DIFFERENTIAL/PLATELET
Eosinophils Absolute: 0.3 10*3/uL (ref 0.0–0.5)
HCT: 40.5 % (ref 34.8–46.6)
LYMPH%: 36.7 % (ref 14.0–49.7)
MONO#: 0.3 10*3/uL (ref 0.1–0.9)
NEUT#: 3 10*3/uL (ref 1.5–6.5)
NEUT%: 52.1 % (ref 38.4–76.8)
Platelets: 231 10*3/uL (ref 145–400)
RBC: 5.17 10*6/uL (ref 3.70–5.45)
WBC: 5.7 10*3/uL (ref 3.9–10.3)
lymph#: 2.1 10*3/uL (ref 0.9–3.3)

## 2012-12-18 LAB — LIPID PANEL
Cholesterol: 232 mg/dL — ABNORMAL HIGH (ref 0–200)
LDL Cholesterol: 153 mg/dL — ABNORMAL HIGH (ref 0–99)
Total CHOL/HDL Ratio: 5.9 Ratio
VLDL: 40 mg/dL (ref 0–40)

## 2012-12-18 LAB — COMPREHENSIVE METABOLIC PANEL (CC13)
ALT: 15 U/L (ref 0–55)
Albumin: 3.7 g/dL (ref 3.5–5.0)
CO2: 23 mEq/L (ref 22–29)
Calcium: 9.3 mg/dL (ref 8.4–10.4)
Chloride: 110 mEq/L — ABNORMAL HIGH (ref 98–109)
Creatinine: 0.9 mg/dL (ref 0.6–1.1)
Potassium: 4.2 mEq/L (ref 3.5–5.1)

## 2012-12-18 MED ORDER — LETROZOLE 2.5 MG PO TABS: 2.5000 mg | ORAL_TABLET | Freq: Every day | ORAL | Status: DC

## 2012-12-18 NOTE — Patient Instructions (Addendum)
Continue femara daily  Go back to Dr. Margaretha Sheffield for your knee for possible injection  Mammogram ordered   I will see you back in 6 months

## 2012-12-18 NOTE — Progress Notes (Signed)
Patient came in about meds. She signed 600.00 and 400.00 one time grant. I called and left Myra message to call in Femara at [facility] outpatient. I also gave phone for insurance inquires.

## 2012-12-18 NOTE — Telephone Encounter (Signed)
Per Salvatore Marvel, managed care, patient qualifies for a one time grant for Femara and prescription to be called into [facility] outpatient pharmacy. Prescription called in as ordered by Dr Welton Flakes.

## 2012-12-19 ENCOUNTER — Telehealth: Payer: Self-pay | Admitting: Medical Oncology

## 2012-12-19 LAB — VITAMIN D 25 HYDROXY (VIT D DEFICIENCY, FRACTURES): Vit D, 25-Hydroxy: 44 ng/mL (ref 30–89)

## 2012-12-19 NOTE — Progress Notes (Signed)
Quick Note:  Call patient:call patient with results of lipid panel. She needs to see her PCP to prescribe medications ______

## 2012-12-19 NOTE — Telephone Encounter (Signed)
Message copied by Rexene Edison on Fri Dec 19, 2012  4:13 PM ------      Message from: Victorino December      Created: Fri Dec 19, 2012  8:57 AM       Call patient:call patient with results of lipid panel. She needs to see her PCP to prescribe medications ------

## 2012-12-19 NOTE — Telephone Encounter (Signed)
LVMOM. Per MD, informed patient of lipid panel results and for patient to f/u with her PCP to prescribe medications. Patient to call office with any questions or concerns.

## 2012-12-31 ENCOUNTER — Emergency Department (INDEPENDENT_AMBULATORY_CARE_PROVIDER_SITE_OTHER)
Admission: EM | Admit: 2012-12-31 | Discharge: 2012-12-31 | Disposition: A | Discharge status: Home / Self Care | Payer: No Typology Code available for payment source | Source: Home / Self Care | Attending: Family Medicine | Admitting: Family Medicine

## 2012-12-31 ENCOUNTER — Encounter (HOSPITAL_COMMUNITY): Payer: Self-pay | Admitting: Emergency Medicine

## 2012-12-31 ENCOUNTER — Emergency Department (INDEPENDENT_AMBULATORY_CARE_PROVIDER_SITE_OTHER): Payer: No Typology Code available for payment source

## 2012-12-31 DIAGNOSIS — S93409A Sprain of unspecified ligament of unspecified ankle, initial encounter: Secondary | ICD-10-CM

## 2012-12-31 DIAGNOSIS — S93401A Sprain of unspecified ligament of right ankle, initial encounter: Secondary | ICD-10-CM

## 2012-12-31 DIAGNOSIS — J069 Acute upper respiratory infection, unspecified: Secondary | ICD-10-CM

## 2012-12-31 MED ORDER — AZITHROMYCIN 250 MG PO TABS: ORAL_TABLET | ORAL | Status: DC

## 2012-12-31 MED ORDER — GUAIFENESIN-CODEINE 100-10 MG/5ML PO SYRP: 10.0000 mL | ORAL_SOLUTION | Freq: Four times a day (QID) | ORAL | Status: DC | PRN

## 2012-12-31 NOTE — ED Provider Notes (Signed)
History    CSN: [id number] Arrival date & time 12/31/12  1536  First MD Initiated Contact with Patient 12/31/12 1654     Chief Complaint  Patient presents with  . Leg Pain   (Consider location/radiation/quality/duration/timing/severity/associated sxs/prior Treatment) Patient is a 57 y.o. female presenting with leg pain. The history is provided by the patient.  Leg Pain Location:  Ankle Time since incident:  2 days Injury: yes   Mechanism of injury: fall   Fall:    Fall occurred:  Tripped and walking   Impact surface:  Grass Ankle location:  R ankle  Past Medical History  Diagnosis Date  . Breast cancer 12/07/2011    right breast 2x3 cm  . Right knee pain 08/26/2012   Past Surgical History  Procedure Laterality Date  . Breast lumpectomy with axillary lymph node biopsy     Family History  Problem Relation Age of Onset  . Cancer Sister 45    acute leukemia   History  Substance Use Topics  . Smoking status: Never Smoker   . Smokeless tobacco: Never Used  . Alcohol Use: No   OB History   Grav Para Term Preterm Abortions TAB SAB Ect Mult Living                 Review of Systems  Constitutional: Negative.   Musculoskeletal: Negative for myalgias, joint swelling and gait problem.  Skin: Negative.     Allergies  Review of patient's allergies indicates no known allergies.  Home Medications   Current Outpatient Rx  Name  Route  Sig  Dispense  Refill  . Cholecalciferol POWD   Does not apply   60,000 Int'l Units by Does not apply route every 30 (thirty) days.         [name] letrozole (FEMARA) 2.5 MG tablet   Oral   Take 1 tablet (2.5 mg total) by mouth daily.   90 tablet   12   . Nutritional Supplements (VITAMIN D MAINTENANCE PO)   Oral   Take by mouth.         [name] azithromycin (ZITHROMAX Z-PAK) 250 MG tablet      Take as directed on pack   6 each   0   . diclofenac (VOLTAREN) 75 MG EC tablet   Oral   Take 1 tablet (75 mg total) by mouth 2 (two) times  daily.   20 tablet   1   . guaiFENesin-codeine (ROBITUSSIN AC) 100-10 MG/5ML syrup   Oral   Take 10 mLs by mouth 4 (four) times daily as needed for cough.   180 mL   0   . RANITIDINE HCL PO   Oral   Take 50 mg by mouth as needed.          BP 125/79  Pulse 66  Temp(Src) 97.5 F (36.4 C) (Oral)  Resp 16  SpO2 99% Physical Exam  Nursing note and vitals reviewed. Constitutional: She is oriented to person, place, and time. She appears well-developed and well-nourished.  Musculoskeletal: She exhibits tenderness.       Right ankle: She exhibits swelling. She exhibits normal range of motion, no ecchymosis and no deformity. Tenderness. Lateral malleolus tenderness found. No medial malleolus and no proximal fibula tenderness found. Achilles tendon normal.  Neurological: She is alert and oriented to person, place, and time.    ED Course  Procedures (including critical care time) Labs Reviewed - No data to display Dg Ankle Complete Right  12/31/2012   *  RADIOLOGY REPORT*  Clinical Data: Larey Seat 2 days ago.  Lateral ankle pain.  RIGHT ANKLE - COMPLETE 3+ VIEW  Comparison: None.  Findings: Soft tissue swelling lateral malleolar region.  No underlying fracture or dislocation.  Ossific structure near the lateral malleolus is well corticated and felt not to represent result of acute fracture.  Plantar spur.  Bony overgrowth anterior inferior aspect of the tibia probably related to degenerative changes.  Small joint effusion not excluded.  IMPRESSION: Soft tissue swelling lateral malleolar region without underlying fracture or dislocation.  Please see above.   Original Report Authenticated By: Lacy Duverney, M.D.   1. Ankle sprain, right, initial encounter   2. URI (upper respiratory infection)     MDM  X-rays reviewed and report per radiologist.   Linna Hoff, MD 12/31/12 [phone]

## 2012-12-31 NOTE — ED Notes (Signed)
Day before yesterday patient fell on her right leg and now she has some swelling on her ankle.  Patient states a productive cough with yellowish mucus.  Nyquil was taken but only little relief.

## 2013-01-02 ENCOUNTER — Other Ambulatory Visit: Payer: No Typology Code available for payment source

## 2013-01-04 NOTE — Progress Notes (Signed)
OFFICE PROGRESS NOTE  CC  No PCP Per Patient [address] [place] [address]  DIAGNOSIS: 57 year old female with invasive ductal carcinoma of the right breast diagnosed in June 2013 in [place].  STAGE:  Clinical stage II (T2 N0)   PRIOR THERAPY: #1Patient had her first mammogram one year ago which was normal. Patient travel to [place] over the summer and noted a mass in the right upper quadrant of her right breast. 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 On mammogram the mass measured about 2 x 2 by 1.5 cm.   #2 Patient went on to have a lump the mean with axillary lymph node dissection on 11/30/2011. The final pathology revealed a 2.0 cm invasive ductal carcinoma that was grade 2:15 axillary lymph nodes were negative for metastatic disease. Tumor was ER positive PR positive HER-2/neu negative Ki-67 10%. Postoperatively patient did receive adjuvant radiation therapy.   #3She was then begun on letrozole 2.5 mg daily she has been on this now for about 3 months   CURRENT THERAPY:letrozole 2.5 mg daily  INTERVAL HISTORY: [name] 57 y.o. female returns for followup visit today. She was in [place] for the last 6 months or so. She has now returned. Her main complaint is her right upper extremity. she also notes ongoing aches and pains in her knees. Patient is still very concerned about her cholesterol and she would like to have a cholesterol check.she otherwise does have some hot flashes and mood swings. Remainder of the 10 point review of systems is negative.  MEDICAL HISTORY: Past Medical History  Diagnosis Date  . Breast cancer 12/07/2011    right breast 2x3 cm  . Right knee pain 08/26/2012    ALLERGIES:  has No Known Allergies.  MEDICATIONS:  Current Outpatient Prescriptions  Medication Sig Dispense Refill   . Nutritional Supplements (VITAMIN D MAINTENANCE PO) Take by mouth.      [name] azithromycin (ZITHROMAX Z-PAK) 250 MG tablet Take as directed on pack  6 each  0  . Cholecalciferol POWD 60,000 Int'l Units by Does not apply route every 30 (thirty) days.      . diclofenac (VOLTAREN) 75 MG EC tablet Take 1 tablet (75 mg total) by mouth 2 (two) times daily.  20 tablet  1  . guaiFENesin-codeine (ROBITUSSIN AC) 100-10 MG/5ML syrup Take 10 mLs by mouth 4 (four) times daily as needed for cough.  180 mL  0  . letrozole (FEMARA) 2.5 MG tablet Take 1 tablet (2.5 mg total) by mouth daily.  90 tablet  12  . RANITIDINE HCL PO Take 50 mg by mouth as needed.       No current facility-administered medications for this visit.    SURGICAL HISTORY:  Past Surgical History  Procedure Laterality Date  . Breast lumpectomy with axillary lymph node biopsy      REVIEW OF SYSTEMS:  Pertinent items are noted in HPI.   HEALTH MAINTENANCE:   PHYSICAL EXAMINATION: Blood pressure 119/75, pulse 64, temperature 97.7 F (36.5 C), temperature source Oral, resp. rate 20, height 5\' 4"  (1.626 m), weight 156 lb 11.2 oz (71.079 kg). Body mass index is 26.88 kg/(m^2). ECOG PERFORMANCE STATUS: 0 - Asymptomatic   General appearance: alert, cooperative and appears stated age Resp: clear to auscultation bilaterally Cardio: regular rate and rhythm GI: soft, non-tender; bowel  sounds normal; no masses,  no organomegaly Extremities: extremities normal, atraumatic, no cyanosis or edema Neurologic: Grossly normal Left breast no masses or nipple discharge. Right breast reveals healing surgical scar no nodularity no nipple discharge no masses.  LABORATORY DATA: Lab Results  Component Value Date   WBC 5.7 12/18/2012   HGB 13.2 12/18/2012   HCT 40.5 12/18/2012   MCV 78.3* 12/18/2012   PLT 231 12/18/2012      Chemistry      Component Value Date/Time   NA 140 12/18/2012 0958   K 4.2 12/18/2012 0958   CL 107 05/12/2012 1441   CO2 23  12/18/2012 0958   BUN 10.9 12/18/2012 0958   CREATININE 0.9 12/18/2012 0958      Component Value Date/Time   CALCIUM 9.3 12/18/2012 0958   ALKPHOS 68 12/18/2012 0958   AST 14 12/18/2012 0958   ALT 15 12/18/2012 0958   BILITOT 0.52 12/18/2012 0958       RADIOGRAPHIC STUDIES:  Dg Ankle Complete Right  12/31/2012   *RADIOLOGY REPORT*  Clinical Data: Larey Seat 2 days ago.  Lateral ankle pain.  RIGHT ANKLE - COMPLETE 3+ VIEW  Comparison: None.  Findings: Soft tissue swelling lateral malleolar region.  No underlying fracture or dislocation.  Ossific structure near the lateral malleolus is well corticated and felt not to represent result of acute fracture.  Plantar spur.  Bony overgrowth anterior inferior aspect of the tibia probably related to degenerative changes.  Small joint effusion not excluded.  IMPRESSION: Soft tissue swelling lateral malleolar region without underlying fracture or dislocation.  Please see above.   Original Report Authenticated By: Lacy Duverney, M.D.    ASSESSMENT: 57 year old female with  #1 T T2 N0 invasive ductal carcinoma (stage II) of the right breast status post right lumpectomy with right axillary lymph node dissection. Patient was found to have a 2.0 cm ER positive PR positive HER-2/neu negative breast cancer 15 lymph nodes were negative for metastatic disease. She is now status post adjuvant radiation therapy and then placed on adjuvant antiestrogen therapy with letrozole.   #2 myalgias and arthralgias and mood swings secondary to her antiestrogen therapy.   #3 patient and I had an extensive discussion regarding her diagnosis of breast cancer pathology and treatment that has been given to her. We discussed extensively antiestrogen therapy specifically letrozole and side effects associated with that. I think the myalgias and arthralgias are due to the letrozole as well as the mood swings and hot flashes. We did discuss the possibility of coming off of it for about 2 weeks. In  starting a different agent if her symptoms resolve. However patient is going to [place] and at this time she wants to continue the letrozole.   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 We discussed referring her to the lymphedema clinic     PLAN:  #1Continue femara daily  #2 Go back to Dr. Margaretha Sheffield for your knee for possible injection  #3 Mammogram ordered   #4 I will see you back in 6 months  #5 I ordered a cholesterol check for her as well as and she is on Femara and is at risk for hypercholesterolemia   All questions were answered. The patient knows to call the clinic with any problems, questions or concerns. We can certainly see the patient much sooner if necessary.  I spent 25 minutes counseling the patient face to face. The total time spent in the appointment was 30 minutes.  Marcy Panning, MD Medical/Oncology [facility] [phone] (beeper) [phone] (Office)

## 2013-01-05 ENCOUNTER — Other Ambulatory Visit: Payer: Self-pay | Admitting: *Deleted

## 2013-01-05 DIAGNOSIS — Z853 Personal history of malignant neoplasm of breast: Secondary | ICD-10-CM

## 2013-01-06 ENCOUNTER — Ambulatory Visit (HOSPITAL_COMMUNITY): Payer: No Typology Code available for payment source

## 2013-01-09 ENCOUNTER — Ambulatory Visit: Payer: No Typology Code available for payment source | Admitting: Sports Medicine

## 2013-01-19 ENCOUNTER — Ambulatory Visit (INDEPENDENT_AMBULATORY_CARE_PROVIDER_SITE_OTHER): Payer: No Typology Code available for payment source | Admitting: Sports Medicine

## 2013-01-19 VITALS — BP 112/73 | Ht 64.0 in | Wt 159.0 lb

## 2013-01-19 DIAGNOSIS — M2241 Chondromalacia patellae, right knee: Secondary | ICD-10-CM

## 2013-01-19 DIAGNOSIS — M25569 Pain in unspecified knee: Secondary | ICD-10-CM

## 2013-01-19 DIAGNOSIS — M2240 Chondromalacia patellae, unspecified knee: Secondary | ICD-10-CM

## 2013-01-19 DIAGNOSIS — M25561 Pain in right knee: Secondary | ICD-10-CM

## 2013-01-19 MED ORDER — METHYLPREDNISOLONE ACETATE 40 MG/ML IJ SUSP
40.0000 mg | Freq: Once | INTRAMUSCULAR | Status: AC
  Administered 2013-01-19: 40 mg via INTRA_ARTICULAR

## 2013-01-20 NOTE — Progress Notes (Signed)
  Subjective:    Patient ID: Gina Johns, female    DOB: [birth year], 57 y.o.   MRN: [id number]  HPI Patient comes in today for followup on right knee pain. She would like to proceed with an intra-articular cortisone injection. Her knee is feeling better but is not completely pain-free. She admits that she has not been doing her home exercises as she should. No swelling. No giving way. No mechanical symptoms. Main discomfort is with going up and down stairs. She is here today with her husband.    Review of Systems     Objective:   Physical Exam Well-developed, well-nourished. No acute distress.  Right knee: Full range of motion. No effusion. 1+ patellofemoral crepitus. Mild tenderness to palpation along the medial joint line but a negative McMurray's. No tenderness along the lateral joint line. Knee is stable to ligamentous exam. Neurovascular intact distally. Walking without significant limp.       Assessment & Plan:  1. Right knee pain secondary to chondromalacia patella  Patient's right knee was injected with cortisone injection today. An anterior medial approach was utilized. Patient tolerated the procedure without difficulty. I've also recommended that she try 1500 mg of glucosamine for the next 6 weeks. She understands the importance of continuing with her home exercises. This point we will leave things open ended for her to followup when necessary.  Consent obtained and verified. Time-out conducted. Noted no overlying erythema, induration, or other signs of local infection. Skin prepped in a sterile fashion. Topical analgesic spray: Ethyl chloride. Joint: right knee Needle: 25g 1 1/2 inch Completed without difficulty. Meds: 3cc 1% xylocaine, 1cc (40mg ) depomedrol  Advised to call if fevers/chills, erythema, induration, drainage, or persistent bleeding.

## 2013-01-22 ENCOUNTER — Ambulatory Visit: Payer: No Typology Code available for payment source | Attending: Family Medicine | Admitting: Family Medicine

## 2013-01-22 ENCOUNTER — Encounter: Payer: Self-pay | Admitting: Family Medicine

## 2013-01-22 VITALS — BP 122/83 | HR 70 | Temp 99.0°F | Ht 63.5 in | Wt 158.2 lb

## 2013-01-22 DIAGNOSIS — C50919 Malignant neoplasm of unspecified site of unspecified female breast: Secondary | ICD-10-CM | POA: Insufficient documentation

## 2013-01-22 DIAGNOSIS — M25569 Pain in unspecified knee: Secondary | ICD-10-CM

## 2013-01-22 DIAGNOSIS — C50911 Malignant neoplasm of unspecified site of right female breast: Secondary | ICD-10-CM

## 2013-01-22 DIAGNOSIS — M25561 Pain in right knee: Secondary | ICD-10-CM

## 2013-01-22 DIAGNOSIS — E785 Hyperlipidemia, unspecified: Secondary | ICD-10-CM

## 2013-01-22 DIAGNOSIS — H269 Unspecified cataract: Secondary | ICD-10-CM

## 2013-01-22 MED ORDER — PRAVASTATIN SODIUM 20 MG PO TABS: 20.0000 mg | ORAL_TABLET | Freq: Every day | ORAL | Status: DC

## 2013-01-22 MED ORDER — PRAVASTATIN SODIUM 40 MG PO TABS: 40.0000 mg | ORAL_TABLET | Freq: Every day | ORAL | Status: DC

## 2013-01-22 NOTE — Progress Notes (Deleted)
Patient ID: Gina Johns, female   DOB: 1955-07-03, 57 y.o.   MRN: [id number]  CC:  Establish Care  HPI: Patient has Right breast cancer along with lymph node involvement treated surgically  June 2013.  Currently on Femra, and patient seens Dr.  Welton Flakes and will see him again December, currently follows up every 6 months. Lab work with Dr.  Welton Flakes shows hyperlipidemia, and was referred for management of that.   No Known Allergies Past Medical History  Diagnosis Date  . Breast cancer 12/07/2011    right breast 2x3 cm  . Right knee pain 08/26/2012   Current Outpatient Prescriptions on File Prior to Visit  Medication Sig Dispense Refill  . azithromycin (ZITHROMAX Z-PAK) 250 MG tablet Take as directed on pack  6 each  0  . Cholecalciferol POWD 60,000 Int'l Units by Does not apply route every 30 (thirty) days.      [name] guaiFENesin-codeine (ROBITUSSIN AC) 100-10 MG/5ML syrup Take 10 mLs by mouth 4 (four) times daily as needed for cough.  180 mL  0  . letrozole (FEMARA) 2.5 MG tablet Take 1 tablet (2.5 mg total) by mouth daily.  90 tablet  12  . Nutritional Supplements (VITAMIN D MAINTENANCE PO) Take by mouth.      [name] RANITIDINE HCL PO Take 50 mg by mouth as needed.      . diclofenac (VOLTAREN) 75 MG EC tablet Take 1 tablet (75 mg total) by mouth 2 (two) times daily.  20 tablet  1   No current facility-administered medications on file prior to visit.   Family History  Problem Relation Age of Onset  . Cancer Sister 109    acute leukemia   History   Social History  . Marital Status: Married    Spouse Name: N/A    Number of Children: N/A  . Years of Education: N/A   Occupational History  . Not on file.   Social History Main Topics  . Smoking status: Never Smoker   . Smokeless tobacco: Never Used  . Alcohol Use: No  . Drug Use: No  . Sexually Active: Yes   Other Topics Concern  . Not on file   Social History Narrative  . No narrative on file    Review of Systems  ______ Constitutional: Negative for fever, chills, diaphoresis, activity change, appetite change and fatigue. ____ HENT: Negative for ear pain, nosebleeds, congestion, facial swelling, rhinorrhea, neck pain, neck stiffness and ear discharge.  ____ Eyes: Negative for pain, discharge, redness, itching and visual disturbance. ____ Respiratory: Negative for cough, choking, chest tightness, shortness of breath, wheezing and stridor.  ____ Cardiovascular: Negative for chest pain, palpitations and leg swelling. ____ Gastrointestinal: Negative for abdominal distention. ____ Genitourinary: Negative for dysuria, urgency, frequency, hematuria, flank pain, decreased urine volume, difficulty urinating and dyspareunia. ____ Musculoskeletal: Negative for back pain, joint swelling, arthralgias and gait problem. ________ Neurological: Negative for dizziness, tremors, seizures, syncope, facial asymmetry, speech difficulty, weakness, light-headedness, numbness and headaches. ____ Hematological: Negative for adenopathy. Does not bruise/bleed easily. ____ Psychiatric/Behavioral: Negative for hallucinations, behavioral problems, confusion, dysphoric mood, decreased concentration and agitation. ______   Objective:   Filed Vitals:   01/22/13 1128  BP: 122/83  Pulse: 70  Temp: 99 F (37.2 C)    Physical Exam ______ Constitutional: Appears well-developed and well-nourished. No distress. ____ HENT: Normocephalic. External right and left ear normal. Oropharynx is clear and moist. ____ Eyes: Conjunctivae and EOM are normal. PERRLA, no scleral icterus. ____ Neck: Normal  ROM. Neck supple. No JVD. No tracheal deviation. No thyromegaly. ____ CVS: RRR, S1/S2 +, no murmurs, no gallops, no carotid bruit.  Pulmonary: Effort and breath sounds normal, no stridor, rhonchi, wheezes, rales.  Abdominal: Soft. BS +,  no distension, tenderness, rebound or guarding. ________ Musculoskeletal: Normal range of motion. No edema and  no tenderness. ____ Lymphadenopathy: No lymphadenopathy noted, cervical, inguinal. Neuro: Alert. Normal reflexes, muscle tone coordination. No cranial nerve deficit. Skin: Skin is warm and dry. No rash noted. Not diaphoretic. No erythema. No pallor. ____ Psychiatric: Normal mood and affect. Behavior, judgment, thought content normal. __  Lab Results  Component Value Date   WBC 5.7 12/18/2012   HGB 13.2 12/18/2012   HCT 40.5 12/18/2012   MCV 78.3* 12/18/2012   PLT 231 12/18/2012   Lab Results  Component Value Date   CREATININE 0.9 12/18/2012   BUN 10.9 12/18/2012   NA 140 12/18/2012   K 4.2 12/18/2012   CL 107 05/12/2012   CO2 23 12/18/2012    No results found for this basename: HGBA1C   Lipid Panel     Component Value Date/Time   CHOL 232* 12/18/2012 1216   TRIG 201* 12/18/2012 1216   HDL 39* 12/18/2012 1216   CHOLHDL 5.9 12/18/2012 1216   VLDL 40 12/18/2012 1216   LDLCALC 153* 12/18/2012 1216       Assessment and plan:   Patient Active Problem List   Diagnosis Date Noted  . Right knee pain 08/26/2012  . Breast cancer, right breast 12/29/2011

## 2013-01-22 NOTE — Progress Notes (Signed)
Patient ID: Gina Johns, female   DOB: [birth year], 57 y.o.   MRN: [id number]  CC:  Establish Care  HPI: Patient has right breast cancer along with lymph node involvement treated surgically June 2013. Currently on Femara, and patient seens Dr. Welton Flakes and will see him again December. Currently follows up every 6 months with Welton Flakes. Lab work with Dr. Welton Flakes shows hyperlipidemia, and was referred for management of that. 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   No Known Allergies Past Medical History  Diagnosis Date  . Breast cancer 12/07/2011    right breast 2x3 cm  . Right knee pain 08/26/2012   Current Outpatient Prescriptions on File Prior to Visit  Medication Sig Dispense Refill  . azithromycin (ZITHROMAX Z-PAK) 250 MG tablet Take as directed on pack  6 each  0  . Cholecalciferol POWD 60,000 Int'l Units by Does not apply route every 30 (thirty) days.      [name] guaiFENesin-codeine (ROBITUSSIN AC) 100-10 MG/5ML syrup Take 10 mLs by mouth 4 (four) times daily as needed for cough.  180 mL  0  . letrozole (FEMARA) 2.5 MG tablet Take 1 tablet (2.5 mg total) by mouth daily.  90 tablet  12  . Nutritional Supplements (VITAMIN D MAINTENANCE PO) Take by mouth.      [name] RANITIDINE HCL PO Take 50 mg by mouth as needed.      . diclofenac (VOLTAREN) 75 MG EC tablet Take 1 tablet (75 mg total) by mouth 2 (two) times daily.  20 tablet  1   No current facility-administered medications on file prior to visit.   Family History  Problem Relation Age of Onset  . Cancer Sister 43    acute leukemia   History   Social History  . Marital Status: Married    Spouse Name: N/A    Number of Children: N/A  . Years of Education: N/A   Occupational History  . Not on file.   Social History Main Topics  . Smoking status: Never Smoker   . Smokeless tobacco: Never Used  . Alcohol Use: No  . Drug Use: No  . Sexually Active: Yes   Other Topics Concern  . Not on file   Social  History Narrative  . No narrative on file    Review of Systems ______ Constitutional: Negative for fever, chills, diaphoresis, activity change, appetite change and fatigue. ____ HENT: Negative for ear pain, nosebleeds, congestion, facial swelling, rhinorrhea, neck pain, neck stiffness and ear discharge.  ____ Eyes: Negative for pain, discharge, redness, itching and visual disturbance. ____ Respiratory: Negative for cough, choking, chest tightness, shortness of breath, wheezing and stridor.  ____ Cardiovascular: Negative for chest pain, palpitations and leg swelling. ____ Gastrointestinal: Negative for abdominal distention. ____ Genitourinary: Negative for dysuria, urgency, frequency, hematuria, flank pain, decreased urine volume, difficulty urinating and dyspareunia. ____ Musculoskeletal: Negative for back pain, joint swelling, arthralgias and gait problem. ________ Neurological: Negative for dizziness, tremors, seizures, syncope, facial asymmetry, speech difficulty, weakness, light-headedness, numbness and headaches. ____ Hematological: Negative for adenopathy. Does not bruise/bleed easily. ____ Psychiatric/Behavioral: Negative for hallucinations, behavioral problems, confusion, dysphoric mood, decreased concentration and agitation. ______   Objective:   Filed Vitals:   01/22/13 1128  BP: 122/83  Pulse: 70  Temp: 99 F (37.2 C)    Physical Exam ______ Constitutional: Appears well-developed and well-nourished. No distress. ____ HENT: Normocephalic. External right and left ear normal. Oropharynx is clear and moist. ____ Eyes: Conjunctivae and EOM  are normal. PERRLA, no scleral icterus. ____ Neck: Normal ROM. Neck supple. No JVD. No tracheal deviation. No thyromegaly. ____ CVS: RRR, S1/S2 +, no murmurs, no gallops, no carotid bruit.  Pulmonary: Effort and breath sounds normal, no stridor, rhonchi, wheezes, rales.  Abdominal: Soft. BS +,  no distension, tenderness, rebound or  guarding. ________ Musculoskeletal: Normal range of motion. No edema and no tenderness. ____ Lymphadenopathy: No lymphadenopathy noted, cervical, inguinal. Neuro: Alert. Normal reflexes, muscle tone coordination. No cranial nerve deficit. Skin: Skin is warm and dry. No rash noted. Not diaphoretic. No erythema. No pallor. ____ Psychiatric: Normal mood and affect. Behavior, judgment, thought content normal. __  Lab Results  Component Value Date   WBC 5.7 12/18/2012   HGB 13.2 12/18/2012   HCT 40.5 12/18/2012   MCV 78.3* 12/18/2012   PLT 231 12/18/2012   Lab Results  Component Value Date   CREATININE 0.9 12/18/2012   BUN 10.9 12/18/2012   NA 140 12/18/2012   K 4.2 12/18/2012   CL 107 05/12/2012   CO2 23 12/18/2012    No results found for this basename: HGBA1C   Lipid Panel     Component Value Date/Time   CHOL 232* 12/18/2012 1216   TRIG 201* 12/18/2012 1216   HDL 39* 12/18/2012 1216   CHOLHDL 5.9 12/18/2012 1216   VLDL 40 12/18/2012 1216   LDLCALC 153* 12/18/2012 1216       Assessment and plan:   Patient Active Problem List   Diagnosis Date Noted  . Right knee pain 08/26/2012  . Breast cancer, right breast 12/29/2011   Pt was referred for eye exam   Pt was started on low dose pravastatin 20 mg po daily  RTC in 2 months to check lipids  Reviewed labs with patient  The patient was given clear instructions to go to ER or return to medical center if symptoms don't improve, worsen or new problems develop.  The patient verbalized understanding.  The patient was told to call to get any lab results if not heard anything in the next week.    Rodney Langton, MD, CDE, FAAFP [facility] [place], [place]

## 2013-01-22 NOTE — Patient Instructions (Addendum)

## 2013-01-22 NOTE — Progress Notes (Deleted)
Patient ID: Gina Johns, female   DOB: 03/29/1956, 57 y.o.   MRN: [id number]  CC:   HPI:  No Known Allergies Past Medical History  Diagnosis Date  . Breast cancer 12/07/2011    right breast 2x3 cm  . Right knee pain 08/26/2012   Current Outpatient Prescriptions on File Prior to Visit  Medication Sig Dispense Refill  . azithromycin (ZITHROMAX Z-PAK) 250 MG tablet Take as directed on pack  6 each  0  . Cholecalciferol POWD 60,000 Int'l Units by Does not apply route every 30 (thirty) days.      [name] guaiFENesin-codeine (ROBITUSSIN AC) 100-10 MG/5ML syrup Take 10 mLs by mouth 4 (four) times daily as needed for cough.  180 mL  0  . letrozole (FEMARA) 2.5 MG tablet Take 1 tablet (2.5 mg total) by mouth daily.  90 tablet  12  . Nutritional Supplements (VITAMIN D MAINTENANCE PO) Take by mouth.      [name] RANITIDINE HCL PO Take 50 mg by mouth as needed.      . diclofenac (VOLTAREN) 75 MG EC tablet Take 1 tablet (75 mg total) by mouth 2 (two) times daily.  20 tablet  1   No current facility-administered medications on file prior to visit.   Family History  Problem Relation Age of Onset  . Cancer Sister 68    acute leukemia   History   Social History  . Marital Status: Married    Spouse Name: N/A    Number of Children: N/A  . Years of Education: N/A   Occupational History  . Not on file.   Social History Main Topics  . Smoking status: Never Smoker   . Smokeless tobacco: Never Used  . Alcohol Use: No  . Drug Use: No  . Sexually Active: Yes   Other Topics Concern  . Not on file   Social History Narrative  . No narrative on file    Review of Systems  Constitutional: Negative for fever, chills, diaphoresis, activity change, appetite change and fatigue.  HENT: Negative for ear pain, nosebleeds, congestion, facial swelling, rhinorrhea, neck pain, neck stiffness and ear discharge.   Eyes: Negative for pain, discharge, redness, itching and visual disturbance.  Respiratory: Negative  for cough, choking, chest tightness, shortness of breath, wheezing and stridor.   Cardiovascular: Negative for chest pain, palpitations and leg swelling.  Gastrointestinal: Negative for abdominal distention.  Genitourinary: Negative for dysuria, urgency, frequency, hematuria, flank pain, decreased urine volume, difficulty urinating and dyspareunia.  Musculoskeletal: Negative for back pain, joint swelling, arthralgias and gait problem.  Neurological: Negative for dizziness, tremors, seizures, syncope, facial asymmetry, speech difficulty, weakness, light-headedness, numbness and headaches.  Hematological: Negative for adenopathy. Does not bruise/bleed easily.  Psychiatric/Behavioral: Negative for hallucinations, behavioral problems, confusion, dysphoric mood, decreased concentration and agitation.    Objective:   Filed Vitals:   01/22/13 1128  BP: 122/83  Pulse: 70  Temp: 99 F (37.2 C)    Physical Exam  Constitutional: Appears well-developed and well-nourished. No distress.  HENT: Normocephalic. External right and left ear normal. Oropharynx is clear and moist.  Eyes: Conjunctivae and EOM are normal. PERRLA, no scleral icterus.  Neck: Normal ROM. Neck supple. No JVD. No tracheal deviation. No thyromegaly.  CVS: RRR, S1/S2 +, no murmurs, no gallops, no carotid bruit.  Pulmonary: Effort and breath sounds normal, no stridor, rhonchi, wheezes, rales.  Abdominal: Soft. BS +,  no distension, tenderness, rebound or guarding.  Musculoskeletal: Normal range of motion. No edema and  no tenderness.  Lymphadenopathy: No lymphadenopathy noted, cervical, inguinal. Neuro: Alert. Normal reflexes, muscle tone coordination. No cranial nerve deficit. Skin: Skin is warm and dry. No rash noted. Not diaphoretic. No erythema. No pallor.  Psychiatric: Normal mood and affect. Behavior, judgment, thought content normal.   Lab Results  Component Value Date   WBC 5.7 12/18/2012   HGB 13.2 12/18/2012   HCT 40.5  12/18/2012   MCV 78.3* 12/18/2012   PLT 231 12/18/2012   Lab Results  Component Value Date   CREATININE 0.9 12/18/2012   BUN 10.9 12/18/2012   NA 140 12/18/2012   K 4.2 12/18/2012   CL 107 05/12/2012   CO2 23 12/18/2012    No results found for this basename: HGBA1C   Lipid Panel     Component Value Date/Time   CHOL 232* 12/18/2012 1216   TRIG 201* 12/18/2012 1216   HDL 39* 12/18/2012 1216   CHOLHDL 5.9 12/18/2012 1216   VLDL 40 12/18/2012 1216   LDLCALC 153* 12/18/2012 1216       Assessment and plan:   Patient Active Problem List   Diagnosis Date Noted  . Dyslipidemia 01/22/2013  . Right knee pain 08/26/2012  . Breast cancer, right breast 12/29/2011

## 2013-01-23 ENCOUNTER — Telehealth: Payer: Self-pay | Admitting: Family Medicine

## 2013-01-23 NOTE — Telephone Encounter (Signed)
[facility] outpatient Pharm received e-scribe for both pravastatin (PRAVACHOL) 20 MG tablet and pravastatin (PRAVACHOL) 40 MG tablet, would like to confirm which is correct.  Please call back on Dr Glennon Hamilton, [phone].

## 2013-01-29 ENCOUNTER — Telehealth: Payer: Self-pay | Admitting: *Deleted

## 2013-01-29 NOTE — Telephone Encounter (Signed)
01/29/13 Spoke with pharmacy for clarification of Pravachol. P.Adaline Trejos,RN BSN [facility]

## 2013-02-03 ENCOUNTER — Encounter (HOSPITAL_COMMUNITY): Payer: Self-pay

## 2013-02-03 ENCOUNTER — Ambulatory Visit (HOSPITAL_COMMUNITY)
Admission: RE | Admit: 2013-02-03 | Discharge: 2013-02-03 | Disposition: A | Discharge status: Home / Self Care | Payer: No Typology Code available for payment source | Source: Ambulatory Visit | Attending: Obstetrics and Gynecology | Admitting: Obstetrics and Gynecology

## 2013-02-03 VITALS — BP 118/74 | Temp 97.4°F | Ht 63.0 in | Wt 156.2 lb

## 2013-02-03 DIAGNOSIS — Z01419 Encounter for gynecological examination (general) (routine) without abnormal findings: Secondary | ICD-10-CM

## 2013-02-03 DIAGNOSIS — Z853 Personal history of malignant neoplasm of breast: Secondary | ICD-10-CM | POA: Insufficient documentation

## 2013-02-03 NOTE — Progress Notes (Signed)
No complaints today.  Pap Smear:  Pap smear completed today. Per patient last Pap smear was 3 years ago and normal. Per patient has no history of an abnormal Pap smear. No Pap smear results in EPIC.  Physical exam: Breasts Breasts symmetrical. No skin abnormalities left breast. Scar right upper breast from lumpectomy June 2013. Mild skin discoloration right breast. Patient has a history of radiation to right breast due to history of breast cancer. No nipple retraction bilateral breasts. No nipple discharge bilateral breasts. No lymphadenopathy. No lumps palpated bilateral breasts. Referred patient to the [facility] for diagnostic mammogram per recommendation. Appointment scheduled for Tuesday, February 17, 2013 at 1400.         Pelvic/Bimanual   Ext Genitalia No lesions, no swelling and no discharge observed on external genitalia.         Vagina Vagina pink and normal texture. No lesions or discharge observed in vagina.          Cervix Cervix is present. Cervix pink and of normal texture. No discharge observed.     Uterus Uterus is present and palpable. Uterus in normal position and normal size.        Adnexae Bilateral ovaries present and palpable. No tenderness on palpation.          Rectovaginal No rectal exam completed today since patient had no rectal complaints. No skin abnormalities observed on exam.

## 2013-02-03 NOTE — Patient Instructions (Signed)
Taught Gina Johns how to perform BSE and gave educational materials to take home. Let her know [facility] will cover Pap smears every 3 years unless has a history of abnormal Pap smears. Referred patient to the [facility] for diagnostic mammogram per recommendation. Appointment scheduled for Tuesday, February 17, 2013 at 1400. Patient aware of appointment and will be there. Let patient know will follow up with her within the next couple weeks with results for Pap smear by phone. Gina Johns verbalized understanding.  Mariem Skolnick, Kathaleen Maser, RN 11:46 AM

## 2013-02-17 ENCOUNTER — Ambulatory Visit
Admission: RE | Admit: 2013-02-17 | Discharge: 2013-02-17 | Disposition: A | Discharge status: Home / Self Care | Payer: No Typology Code available for payment source | Source: Ambulatory Visit | Attending: Obstetrics and Gynecology | Admitting: Obstetrics and Gynecology

## 2013-02-17 ENCOUNTER — Other Ambulatory Visit: Payer: Self-pay | Admitting: Obstetrics and Gynecology

## 2013-02-17 DIAGNOSIS — Z853 Personal history of malignant neoplasm of breast: Secondary | ICD-10-CM

## 2013-03-03 ENCOUNTER — Other Ambulatory Visit: Payer: Self-pay | Admitting: Obstetrics and Gynecology

## 2013-03-03 ENCOUNTER — Ambulatory Visit
Admission: RE | Admit: 2013-03-03 | Discharge: 2013-03-03 | Disposition: A | Discharge status: Home / Self Care | Payer: No Typology Code available for payment source | Source: Ambulatory Visit | Attending: Obstetrics and Gynecology | Admitting: Obstetrics and Gynecology

## 2013-03-03 DIAGNOSIS — Z853 Personal history of malignant neoplasm of breast: Secondary | ICD-10-CM

## 2013-03-04 ENCOUNTER — Emergency Department (INDEPENDENT_AMBULATORY_CARE_PROVIDER_SITE_OTHER)
Admission: EM | Admit: 2013-03-04 | Discharge: 2013-03-04 | Disposition: A | Discharge status: Home / Self Care | Payer: Self-pay | Source: Home / Self Care | Attending: Family Medicine | Admitting: Family Medicine

## 2013-03-04 ENCOUNTER — Encounter (HOSPITAL_COMMUNITY): Payer: Self-pay | Admitting: *Deleted

## 2013-03-04 DIAGNOSIS — M542 Cervicalgia: Secondary | ICD-10-CM

## 2013-03-04 DIAGNOSIS — M79609 Pain in unspecified limb: Secondary | ICD-10-CM

## 2013-03-04 DIAGNOSIS — IMO0002 Reserved for concepts with insufficient information to code with codable children: Secondary | ICD-10-CM

## 2013-03-04 DIAGNOSIS — Z23 Encounter for immunization: Secondary | ICD-10-CM

## 2013-03-04 MED ORDER — TETANUS-DIPHTH-ACELL PERTUSSIS 5-2.5-18.5 LF-MCG/0.5 IM SUSP
0.5000 mL | Freq: Once | INTRAMUSCULAR | Status: AC
  Administered 2013-03-04: 0.5 mL via INTRAMUSCULAR

## 2013-03-04 MED ORDER — TETANUS-DIPHTHERIA TOXOIDS TD 5-2 LFU IM INJ
INJECTION | INTRAMUSCULAR | Status: AC
  Filled 2013-03-04: qty 0.5

## 2013-03-04 MED ORDER — SILVER SULFADIAZINE 1 % EX CREA: TOPICAL_CREAM | Freq: Two times a day (BID) | CUTANEOUS | Status: DC
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
## 2013-03-05 ENCOUNTER — Other Ambulatory Visit: Payer: Self-pay | Admitting: Oncology

## 2013-03-05 DIAGNOSIS — C50911 Malignant neoplasm of unspecified site of right female breast: Secondary | ICD-10-CM

## 2013-03-06 ENCOUNTER — Telehealth: Payer: Self-pay | Admitting: *Deleted

## 2013-03-06 ENCOUNTER — Ambulatory Visit (HOSPITAL_COMMUNITY)
Admission: RE | Admit: 2013-03-06 | Discharge: 2013-03-06 | Disposition: A | Discharge status: Home / Self Care | Payer: Medicaid Other | Source: Ambulatory Visit | Attending: Oncology | Admitting: Oncology

## 2013-03-06 DIAGNOSIS — Y849 Medical procedure, unspecified as the cause of abnormal reaction of the patient, or of later complication, without mention of misadventure at the time of the procedure: Secondary | ICD-10-CM | POA: Insufficient documentation

## 2013-03-06 DIAGNOSIS — IMO0002 Reserved for concepts with insufficient information to code with codable children: Secondary | ICD-10-CM | POA: Insufficient documentation

## 2013-03-06 DIAGNOSIS — D0590 Unspecified type of carcinoma in situ of unspecified breast: Secondary | ICD-10-CM | POA: Insufficient documentation

## 2013-03-06 DIAGNOSIS — C50411 Malignant neoplasm of upper-outer quadrant of right female breast: Secondary | ICD-10-CM | POA: Insufficient documentation

## 2013-03-06 DIAGNOSIS — C50911 Malignant neoplasm of unspecified site of right female breast: Secondary | ICD-10-CM

## 2013-03-06 MED ORDER — GADOBENATE DIMEGLUMINE 529 MG/ML IV SOLN
14.0000 mL | Freq: Once | INTRAVENOUS | Status: AC | PRN
  Administered 2013-03-06: 14 mL via INTRAVENOUS

## 2013-03-06 NOTE — Telephone Encounter (Signed)
Received return phone call from patient's husband and confirmed patient's appt. For [facility] 03/11/13 at 12N.  Instructions and contact information given.  No questions or concerns at this time.

## 2013-03-11 ENCOUNTER — Ambulatory Visit
Admission: RE | Admit: 2013-03-11 | Discharge: 2013-03-11 | Disposition: A | Discharge status: Home / Self Care | Payer: Self-pay | Source: Ambulatory Visit | Attending: Radiation Oncology | Admitting: Radiation Oncology

## 2013-03-11 ENCOUNTER — Encounter: Payer: Self-pay | Admitting: Oncology

## 2013-03-11 ENCOUNTER — Other Ambulatory Visit (HOSPITAL_BASED_OUTPATIENT_CLINIC_OR_DEPARTMENT_OTHER): Payer: Medicaid Other | Admitting: Lab

## 2013-03-11 ENCOUNTER — Encounter (INDEPENDENT_AMBULATORY_CARE_PROVIDER_SITE_OTHER): Payer: Self-pay | Admitting: General Surgery

## 2013-03-11 ENCOUNTER — Ambulatory Visit (HOSPITAL_BASED_OUTPATIENT_CLINIC_OR_DEPARTMENT_OTHER): Payer: Medicaid Other | Admitting: General Surgery

## 2013-03-11 ENCOUNTER — Ambulatory Visit: Payer: Self-pay | Admitting: Physical Therapy

## 2013-03-11 ENCOUNTER — Ambulatory Visit: Payer: Self-pay

## 2013-03-11 ENCOUNTER — Ambulatory Visit (HOSPITAL_BASED_OUTPATIENT_CLINIC_OR_DEPARTMENT_OTHER): Payer: Medicaid Other | Admitting: Oncology

## 2013-03-11 VITALS — BP 159/89 | HR 83 | Temp 98.3°F | Resp 20 | Ht 63.0 in | Wt 156.3 lb

## 2013-03-11 DIAGNOSIS — C50419 Malignant neoplasm of upper-outer quadrant of unspecified female breast: Secondary | ICD-10-CM

## 2013-03-11 DIAGNOSIS — C50411 Malignant neoplasm of upper-outer quadrant of right female breast: Secondary | ICD-10-CM

## 2013-03-11 DIAGNOSIS — C50911 Malignant neoplasm of unspecified site of right female breast: Secondary | ICD-10-CM

## 2013-03-11 LAB — COMPREHENSIVE METABOLIC PANEL (CC13)
ALT: 28 U/L (ref 0–55)
AST: 26 U/L (ref 5–34)
Alkaline Phosphatase: 90 U/L (ref 40–150)
CO2: 24 mEq/L (ref 22–29)
Sodium: 142 mEq/L (ref 136–145)
Total Bilirubin: 0.45 mg/dL (ref 0.20–1.20)
Total Protein: 7.8 g/dL (ref 6.4–8.3)

## 2013-03-11 LAB — CBC WITH DIFFERENTIAL/PLATELET
BASO%: 0.7 % (ref 0.0–2.0)
LYMPH%: 30.1 % (ref 14.0–49.7)
MCHC: 33.1 g/dL (ref 31.5–36.0)
MONO#: 0.5 10*3/uL (ref 0.1–0.9)
MONO%: 7 % (ref 0.0–14.0)
Platelets: 268 10*3/uL (ref 145–400)
RBC: 5.18 10*6/uL (ref 3.70–5.45)
WBC: 6.6 10*3/uL (ref 3.9–10.3)

## 2013-03-11 NOTE — Progress Notes (Signed)
Patient ID: Gina Johns, female   DOB: 08/26/1955, 56 y.o.   MRN: [id number]  No chief complaint on file.   HPI Kaelin Ki is a 57 y.o. female.  We are asked to see the pt in consultation by Dr. Eagle to evaluate her for right breast dcis. The pt is a 57 yo [place] female who has a history of right lumpectomy and axillary lymph node dissection in June of 2013 in [place]. She recently went for a follow up mammogram and was found to have a large area of calcifications throughout the upper right breast. This was biopsied and came back as dcis. She denies any breast pain or discharge from the nipple.   HPI  Past Medical History  Diagnosis Date  . Breast cancer 12/07/2011    right breast 2x3 cm  . Right knee pain 08/26/2012    Past Surgical History  Procedure Laterality Date  . Breast lumpectomy with axillary lymph node biopsy      Family History  Problem Relation Age of Onset  . Cancer Sister 76    acute leukemia  . Hypertension Sister   . Hypertension Mother     Social History History  Substance Use Topics  . Smoking status: Never Smoker   . Smokeless tobacco: Never Used  . Alcohol Use: No    No Known Allergies  Current Outpatient Prescriptions  Medication Sig Dispense Refill  . azithromycin (ZITHROMAX Z-PAK) 250 MG tablet Take as directed on pack  6 each  0  . Cholecalciferol POWD 60,000 Int'l Units by Does not apply route every 30 (thirty) days.      . diclofenac (VOLTAREN) 75 MG EC tablet Take 1 tablet (75 mg total) by mouth 2 (two) times daily.  20 tablet  1  . guaiFENesin-codeine (ROBITUSSIN AC) 100-10 MG/5ML syrup Take 10 mLs by mouth 4 (four) times daily as needed for cough.  180 mL  0  . letrozole (FEMARA) 2.5 MG tablet Take 1 tablet (2.5 mg total) by mouth daily.  90 tablet  12  . Nutritional Supplements (VITAMIN D MAINTENANCE PO) Take by mouth.      . pravastatin (PRAVACHOL) 20 MG tablet Take 1 tablet (20 mg total) by mouth at bedtime.  30 tablet  2  .  RANITIDINE HCL PO Take 50 mg by mouth as needed.      . silver sulfADIAZINE (SILVADENE) 1 % cream Apply topically 2 (two) times daily. After washing thoroughly  50 g  0   No current facility-administered medications for this visit.    Review of Systems Review of Systems  Constitutional: Negative.   HENT: Negative.   Eyes: Negative.   Respiratory: Negative.   Cardiovascular: Negative.   Gastrointestinal: Negative.   Endocrine: Negative.   Genitourinary: Negative.   Musculoskeletal: Negative.   Skin: Negative.   Allergic/Immunologic: Negative.   Neurological: Negative.   Hematological: Negative.   Psychiatric/Behavioral: Negative.     Last menstrual period 03/04/2013.  Physical Exam Physical Exam  Constitutional: She is oriented to person, place, and time. She appears well-developed and well-nourished.  HENT:  Head: Normocephalic and atraumatic.  Eyes: Conjunctivae and EOM are normal. Pupils are equal, round, and reactive to light.  Neck: Normal range of motion. Neck supple.  Cardiovascular: Normal rate, regular rhythm and normal heart sounds.   Pulmonary/Chest: Effort normal and breath sounds normal.  She has a well healed scar in the upper right breast. There is no palpable mass in either breast. There is no   palpable axillary, supraclavicular, or cervical lymphadenopathy.  Abdominal: Soft. Bowel sounds are normal. She exhibits no mass. There is no tenderness.  Musculoskeletal: Normal range of motion.  Lymphadenopathy:    She has no cervical adenopathy.  Neurological: She is alert and oriented to person, place, and time.  Skin: Skin is warm and dry.  Psychiatric: She has a normal mood and affect. Her behavior is normal.    Data Reviewed As above  Assessment    The pt appears to have a large area of dcis in the right breast. Since she has had radiation in the past she is no longer a candidate for breast conservation. I have discussed with her in detail the different  options for surgery and she will need a right mastectomy. I have discussed with her the risks and benefits of surgery as well as some of the technical aspects and she understands and wishes to proceed     Plan    Plan for right mastectomy        TOTH III,Daoud Lobue S 03/11/2013, 4:14 PM    

## 2013-03-11 NOTE — Progress Notes (Signed)
OFFICE PROGRESS NOTE  CC  No PCP Per Patient [address] [place] [address]  DIAGNOSIS: 57 year old female with invasive ductal carcinoma of the right breast diagnosed in June 2013 in [place].  STAGE:  Clinical stage II (T2 N0)   PRIOR THERAPY: #1Patient had her first mammogram one year ago which was normal. Patient travel to [place] over the summer and noted a mass in the right upper quadrant of her right breast. 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 On mammogram the mass measured about 2 x 2 by 1.5 cm.   #2 Patient went on to have a lump the mean with axillary lymph node dissection on 11/30/2011. The final pathology revealed a 2.0 cm invasive ductal carcinoma that was grade 2:15 axillary lymph nodes were negative for metastatic disease. Tumor was ER positive PR positive HER-2/neu negative Ki-67 10%. Postoperatively patient did receive adjuvant radiation therapy.   #3She was then begun on letrozole 2.5 mg daily she has been on this now for about 3 months    CURRENT THERAPY:letrozole 2.5 mg daily  INTERVAL HISTORY: Gina Johns 57 y.o. female returns for followup visit today.  patient had a mammogram performed August 2014 that showed calcifications in the right breast which is the 2 lateral breast and her prior cancer site. 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 She is seen in the multidisciplinary breast clinic for further treatment options. She met with Dr. Chevis Pretty who has recommended a mastectomy. She has had a full axillary lymph node dissection we will not do the lymph nodes. Patient and I discussed the rationale for this. She is agreeable to proceeding with a mastectomy as soon as possible.  MEDICAL HISTORY: Past Medical History  Diagnosis Date  . Breast cancer 12/07/2011     right breast 2x3 cm  . Right knee pain 08/26/2012    ALLERGIES:  has No Known Allergies.  MEDICATIONS:  Current Outpatient Prescriptions  Medication Sig Dispense Refill  . azithromycin (ZITHROMAX Z-PAK) 250 MG tablet Take as directed on pack  6 each  0  . Cholecalciferol POWD 60,000 Int'l Units by Does not apply route every 30 (thirty) days.      . diclofenac (VOLTAREN) 75 MG EC tablet Take 1 tablet (75 mg total) by mouth 2 (two) times daily.  20 tablet  1  . guaiFENesin-codeine (ROBITUSSIN AC) 100-10 MG/5ML syrup Take 10 mLs by mouth 4 (four) times daily as needed for cough.  180 mL  0  . letrozole (FEMARA) 2.5 MG tablet Take 1 tablet (2.5 mg total) by mouth daily.  90 tablet  12  . Nutritional Supplements (VITAMIN D MAINTENANCE PO) Take by mouth.      . pravastatin (PRAVACHOL) 20 MG tablet Take 1 tablet (20 mg total) by mouth at bedtime.  30 tablet  2  . RANITIDINE HCL PO Take 50 mg by mouth as needed.      . silver sulfADIAZINE (SILVADENE) 1 % cream Apply topically 2 (two) times daily. After washing thoroughly  50 g  0   No current facility-administered medications for this visit.    SURGICAL HISTORY:  Past Surgical History  Procedure Laterality Date  . Breast lumpectomy with axillary lymph node biopsy      REVIEW OF SYSTEMS:  Pertinent items are noted in HPI.  HEALTH MAINTENANCE:   PHYSICAL EXAMINATION: Blood pressure 159/89, pulse 83, temperature 98.3 F (36.8 C), temperature source Oral, resp. rate 20, height 5\' 3"  (1.6 m), weight 156 lb 4.8 oz (70.897 kg), last menstrual period 03/04/2013. Body mass index is 27.69 kg/(m^2). ECOG PERFORMANCE STATUS: 0 - Asymptomatic   General appearance: alert, cooperative and appears stated age Resp: clear to auscultation bilaterally Cardio: regular rate and rhythm GI: soft, non-tender; bowel sounds normal; no masses,  no organomegaly Extremities: extremities normal, atraumatic, no cyanosis or edema Neurologic: Grossly  normal Left breast no masses or nipple discharge. Right breast reveals healing surgical scar no nodularity no nipple discharge no masses.  LABORATORY DATA: Lab Results  Component Value Date   WBC 6.6 03/11/2013   HGB 13.5 03/11/2013   HCT 40.8 03/11/2013   MCV 78.7* 03/11/2013   PLT 268 03/11/2013      Chemistry      Component Value Date/Time   NA 142 03/11/2013 1245   K 4.1 03/11/2013 1245   CL 107 05/12/2012 1441   CO2 24 03/11/2013 1245   BUN 15.7 03/11/2013 1245   CREATININE 0.9 03/11/2013 1245      Component Value Date/Time   CALCIUM 9.8 03/11/2013 1245   ALKPHOS 90 03/11/2013 1245   AST 26 03/11/2013 1245   ALT 28 03/11/2013 1245   BILITOT 0.45 03/11/2013 1245       RADIOGRAPHIC STUDIES:  Dg Ankle Complete Right  12/31/2012   *RADIOLOGY REPORT*  Clinical Data: Larey Seat 2 days ago.  Lateral ankle pain.  RIGHT ANKLE - COMPLETE 3+ VIEW  Comparison: None.  Findings: Soft tissue swelling lateral malleolar region.  No underlying fracture or dislocation.  Ossific structure near the lateral malleolus is well corticated and felt not to represent result of acute fracture.  Plantar spur.  Bony overgrowth anterior inferior aspect of the tibia probably related to degenerative changes.  Small joint effusion not excluded.  IMPRESSION: Soft tissue swelling lateral malleolar region without underlying fracture or dislocation.  Please see above.   Original Report Authenticated By: Lacy Duverney, M.D  RADIOLOGY REPORT*  Clinical Data: Malignant lumpectomy of the right breast in July,  2013 in [place]. Pathology report from [place] describes  invasive ductal carcinoma (grade 2) with grade 1/2 DCIS. 15  negative right axillary lymph nodes at the time of surgery.  Subsequent radiation therapy to the right breast. Annual  evaluation.  DIGITAL DIAGNOSTIC BILATERAL MAMMOGRAM WITH CAD  Comparison: The prior mammograms from [place] are not available for  direct comparison. I have paper images of a  preoperative  ultrasound dated 12/04/2011 at the time of diagnosis.  Findings:  ACR Breast Density Category b: There are scattered areas of  fibroglandular density.  CC and MLO views of both breast, a spot tangential view of the  upper right breast at the lumpectomy site, and spot magnification  views of calcifications in the upper right breast were obtained.  The spot magnification views confirm amorphous calcifications which  are segmental and in the upper outer quadrant of the breast. As  one approaches the nipple, the calcifications are in a linear  orientation and are pleomorphic. Spot tangential view shows the  expected post lumpectomy changes in the upper right breast.  There are no findings suspicious for malignancy in the left  breast.  Mammographic images were processed with CAD.  IMPRESSION:  1. Suspicious calcifications in a segmental distribution in the  upper outer right breast.  2. Expected post lumpectomy changes  in the upper right breast.  3. No mammographic evidence of malignancy, left breast.  RECOMMENDATION:  Stereotactic core needle biopsy of the suspicious calcifications is  recommended.  I have discussed the findings and recommendations with the patient  and I showed her the mammographic images during this discussion.  All of her questions were answered regarding the stereotactic  biopsy. This has been scheduled for Tuesday, September 9, at 8  o'clock a.m.  Results were also provided in writing at the conclusion of the  visit. If applicable, a reminder letter will be sent to the  patient regarding her next appointment.  BI-RADS CATEGORY 4: Suspicious abnormality - biopsy should be  considered.  Original Report Authenticated By: Hulan Saas, M.D.     *RADIOLOGY REPORT*  Clinical Data:History of prior right malignant lumpectomy July 2013  for grade II invasive ductal carcinoma and grade 1 to II DCIS.  Subsequent radiation. Recent malignant stereotactic  core biopsy of  microcalcifications at the 12 o'clock position of the right breast  demonstrating DCIS 03/03/13.  No labs.  BILATERAL BREAST MRI WITH AND WITHOUT CONTRAST  Technique: Multiplanar, multisequence MR images of both breasts  were obtained prior to and following the intravenous administration  of 14ml of multihance.  THREE-DIMENSIONAL MR IMAGE RENDERING ON INDEPENDENT WORKSTATION:  Three-dimensional MR images were rendered by post-processing of  the original MR data on an independent DynaCad workstation. The  three-dimensional MR images were interpreted, and findings are  reported in the following complete MRI report for this study.  Comparison: Recent mammograms.  FINDINGS:  Breast composition: b: There are scattered areas of  fibroglandular density.  Background parenchymal enhancement: Mild  Right breast: Post surgical clip artifact is seen over the  posterior third of the upper central right breast. Minimal  adjacent scarring along the superficial aspect of the right  pectoralis muscle. There is a 1 x 1.2 cm hematoma over the  patient's recent biopsy site at the 12 o'clock position of the  right breast in the anterior third approximately 2 cm from the  nipple. Clip artifact is seen along the posterior-superior aspect  of the hematoma. There is no evidence of focal mass or abnormal  enhancement.  Left breast: No mass or abnormal enhancement.  Lymph nodes: No abnormal appearing lymph nodes.  Ancillary findings: None.  IMPRESSION:  Post biopsy clip artifact with 1.2 cm hematoma over the recent  biopsy proven DCIS at the 12 o'clock position of the right breast 2  cm from the nipple. Post surgical changes over the deep third of  the upper central right breast. No suspicious masses or  enhancement in either breast. No adenopathy.  RECOMMENDATION:  Recommend continued followup per clinical treatment plan.     ASSESSMENT: 57 year old female with  #1 T T2 N0 invasive  ductal carcinoma (stage II) of the right breast status post right lumpectomy with right axillary lymph node dissection. Patient was found to have a 2.0 cm ER positive PR positive HER-2/neu negative breast cancer 15 lymph nodes were negative for metastatic disease. She is now status post adjuvant radiation therapy and then placed on adjuvant antiestrogen therapy with letrozole.   #2 myalgias and arthralgias and mood swings secondary to her antiestrogen therapy.   #3 patient and I had an extensive discussion regarding her diagnosis of breast cancer pathology and treatment that has been given to her. We discussed extensively antiestrogen therapy specifically letrozole and side effects associated with that. I think the myalgias and arthralgias are due  to the letrozole as well as the mood swings and hot flashes. We did discuss the possibility of coming off of it for about 2 weeks. In starting a different agent if her symptoms resolve. However patient is going to [place] and at this time she wants to continue the letrozole.   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 We discussed referring her to the lymphedema clinic     PLAN:  #1Continue femara daily  #2 proceed with mastectomy of the right breast due to findings of DCIS with calcifications.  #3 she'll return after her surgery for followup.    All questions were answered. The patient knows to call the clinic with any problems, questions or concerns. We can certainly see the patient much sooner if necessary.  I spent 40 minutes counseling the patient face to face. The total time spent in the appointment was 60 minutes.    Drue Second, MD Medical/Oncology [facility] [phone] (beeper) [phone] (Office)

## 2013-03-11 NOTE — Progress Notes (Signed)
Patient had prior radiation to the right breast in [place] in 2013.  She describes a blackened breast from her clavicle to underneath the breast. The description from the clinic is confusing as it descrbies radiation to the tumor bed to 36 Gy in 18 fractions.  Because of the extent of her disease and her prior radiation, she is not a candidate for further attempts at breast conservation. She has discussed mastectomy with Dr. Carolynne Edouard.

## 2013-03-12 ENCOUNTER — Telehealth: Payer: Self-pay | Admitting: Oncology

## 2013-03-12 NOTE — Telephone Encounter (Signed)
, °

## 2013-03-13 ENCOUNTER — Telehealth: Payer: Self-pay | Admitting: Oncology

## 2013-03-16 ENCOUNTER — Telehealth: Payer: Self-pay

## 2013-03-17 ENCOUNTER — Ambulatory Visit: Payer: Self-pay | Admitting: Oncology

## 2013-03-17 ENCOUNTER — Telehealth: Payer: Self-pay | Admitting: Medical Oncology

## 2013-03-17 ENCOUNTER — Telehealth: Payer: Self-pay | Admitting: *Deleted

## 2013-03-17 ENCOUNTER — Other Ambulatory Visit: Payer: Self-pay | Admitting: Medical Oncology

## 2013-03-17 ENCOUNTER — Ambulatory Visit (HOSPITAL_BASED_OUTPATIENT_CLINIC_OR_DEPARTMENT_OTHER): Payer: Medicaid Other | Admitting: Oncology

## 2013-03-17 VITALS — BP 160/93 | HR 80 | Temp 97.9°F | Resp 20 | Ht 63.0 in | Wt 154.2 lb

## 2013-03-17 DIAGNOSIS — C50411 Malignant neoplasm of upper-outer quadrant of right female breast: Secondary | ICD-10-CM

## 2013-03-17 DIAGNOSIS — C50419 Malignant neoplasm of upper-outer quadrant of unspecified female breast: Secondary | ICD-10-CM

## 2013-03-17 NOTE — Telephone Encounter (Signed)
Per MD, called pt to inquire whether she can come to earlier appt. Spoke with patient and patient to come for appt today @ 1130. Denies questions.

## 2013-03-17 NOTE — Telephone Encounter (Signed)
Per Hale Drone pt is already aware of her appt for 03/17/13@ 11:30am...td

## 2013-03-19 ENCOUNTER — Encounter (HOSPITAL_COMMUNITY): Payer: Self-pay | Admitting: *Deleted

## 2013-03-19 ENCOUNTER — Telehealth (HOSPITAL_COMMUNITY): Payer: Self-pay | Admitting: *Deleted

## 2013-03-19 MED ORDER — CEFAZOLIN SODIUM-DEXTROSE 2-3 GM-% IV SOLR
2.0000 g | INTRAVENOUS | Status: AC
  Administered 2013-03-20: 2 g via INTRAVENOUS
  Filled 2013-03-19: qty 50

## 2013-03-19 MED ORDER — CHLORHEXIDINE GLUCONATE 4 % EX LIQD: 1.0000 "application " | Freq: Once | CUTANEOUS | Status: DC

## 2013-03-19 NOTE — Progress Notes (Signed)
Pt denies SOB, chest pain, and being under the care of a cardiologist. Pt denies having a chest x ray and EKG within the last year. Pt denies having a stress test, echo, and cardiac cath. Pt states that she takes no morning medications currently. Pt was advised to stop taking Aspirin and herbal medications. Do not take any NSAIDs ie: Ibuprofen, Advil, Naproxen or any medication containing Aspirin

## 2013-03-19 NOTE — Telephone Encounter (Signed)
Telephoned patient at home # and left message to return call to [facility] 

## 2013-03-20 ENCOUNTER — Encounter (HOSPITAL_COMMUNITY)
Admission: RE | Disposition: A | Discharge status: Home / Self Care | Payer: Self-pay | Source: Ambulatory Visit | Attending: General Surgery

## 2013-03-20 ENCOUNTER — Ambulatory Visit (HOSPITAL_COMMUNITY): Payer: Medicaid Other | Admitting: Critical Care Medicine

## 2013-03-20 ENCOUNTER — Ambulatory Visit (HOSPITAL_COMMUNITY)
Admission: RE | Admit: 2013-03-20 | Discharge: 2013-03-21 | Disposition: A | Discharge status: Home / Self Care | Payer: Medicaid Other | Source: Ambulatory Visit | Attending: General Surgery | Admitting: General Surgery

## 2013-03-20 ENCOUNTER — Encounter (HOSPITAL_COMMUNITY): Payer: Self-pay | Admitting: Critical Care Medicine

## 2013-03-20 DIAGNOSIS — Z79899 Other long term (current) drug therapy: Secondary | ICD-10-CM | POA: Insufficient documentation

## 2013-03-20 DIAGNOSIS — D0590 Unspecified type of carcinoma in situ of unspecified breast: Secondary | ICD-10-CM

## 2013-03-20 DIAGNOSIS — C50411 Malignant neoplasm of upper-outer quadrant of right female breast: Secondary | ICD-10-CM

## 2013-03-20 HISTORY — DX: Unspecified osteoarthritis, unspecified site: M19.90

## 2013-03-20 HISTORY — DX: Anxiety disorder, unspecified: F41.9

## 2013-03-20 HISTORY — PX: TOTAL MASTECTOMY: SHX6129

## 2013-03-20 SURGERY — MASTECTOMY, SIMPLE
Anesthesia: General | Site: Breast | Laterality: Right | Wound class: Clean

## 2013-03-20 MED ORDER — OXYCODONE HCL 5 MG PO TABS
5.0000 mg | ORAL_TABLET | Freq: Once | ORAL | Status: AC | PRN
  Administered 2013-03-20: 5 mg via ORAL

## 2013-03-20 MED ORDER — LIDOCAINE HCL (CARDIAC) 20 MG/ML IV SOLN
INTRAVENOUS | Status: DC | PRN
  Administered 2013-03-20: 40 mg via INTRAVENOUS

## 2013-03-20 MED ORDER — ONDANSETRON HCL 4 MG PO TABS: 4.0000 mg | ORAL_TABLET | Freq: Four times a day (QID) | ORAL | Status: DC | PRN

## 2013-03-20 MED ORDER — HYDROMORPHONE HCL PF 1 MG/ML IJ SOLN
0.2500 mg | INTRAMUSCULAR | Status: DC | PRN
  Administered 2013-03-20 (×2): 0.5 mg via INTRAVENOUS

## 2013-03-20 MED ORDER — PHENYLEPHRINE HCL 10 MG/ML IJ SOLN
INTRAMUSCULAR | Status: DC | PRN
  Administered 2013-03-20 (×4): 80 ug via INTRAVENOUS

## 2013-03-20 MED ORDER — OXYCODONE HCL 5 MG PO TABS
ORAL_TABLET | ORAL | Status: AC
  Filled 2013-03-20: qty 1

## 2013-03-20 MED ORDER — 0.9 % SODIUM CHLORIDE (POUR BTL) OPTIME
TOPICAL | Status: DC | PRN
  Administered 2013-03-20: 1000 mL

## 2013-03-20 MED ORDER — LACTATED RINGERS IV SOLN
INTRAVENOUS | Status: DC
  Administered 2013-03-20: 50 mL/h via INTRAVENOUS
  Administered 2013-03-20: 13:00:00 via INTRAVENOUS

## 2013-03-20 MED ORDER — HYDROMORPHONE HCL PF 1 MG/ML IJ SOLN
INTRAMUSCULAR | Status: AC
  Filled 2013-03-20: qty 1

## 2013-03-20 MED ORDER — ONDANSETRON HCL 4 MG/2ML IJ SOLN: 4.0000 mg | Freq: Four times a day (QID) | INTRAMUSCULAR | Status: DC | PRN

## 2013-03-20 MED ORDER — MORPHINE SULFATE 4 MG/ML IJ SOLN: 4.0000 mg | INTRAMUSCULAR | Status: DC | PRN

## 2013-03-20 MED ORDER — EPHEDRINE SULFATE 50 MG/ML IJ SOLN
INTRAMUSCULAR | Status: DC | PRN
  Administered 2013-03-20: 5 mg via INTRAVENOUS
  Administered 2013-03-20: 10 mg via INTRAVENOUS
  Administered 2013-03-20: 5 mg via INTRAVENOUS

## 2013-03-20 MED ORDER — PROMETHAZINE HCL 25 MG/ML IJ SOLN: 6.2500 mg | INTRAMUSCULAR | Status: DC | PRN

## 2013-03-20 MED ORDER — PROPOFOL 10 MG/ML IV BOLUS
INTRAVENOUS | Status: DC | PRN
  Administered 2013-03-20: 160 mg via INTRAVENOUS

## 2013-03-20 MED ORDER — FENTANYL CITRATE 0.05 MG/ML IJ SOLN
INTRAMUSCULAR | Status: DC | PRN
  Administered 2013-03-20 (×3): 25 ug via INTRAVENOUS
  Administered 2013-03-20 (×2): 50 ug via INTRAVENOUS
  Administered 2013-03-20: 25 ug via INTRAVENOUS

## 2013-03-20 MED ORDER — ONDANSETRON HCL 4 MG/2ML IJ SOLN
INTRAMUSCULAR | Status: DC | PRN
  Administered 2013-03-20: 4 mg via INTRAVENOUS

## 2013-03-20 MED ORDER — LETROZOLE 2.5 MG PO TABS
2.5000 mg | ORAL_TABLET | Freq: Every day | ORAL | Status: DC
  Administered 2013-03-20 – 2013-03-21 (×2): 2.5 mg via ORAL
  Filled 2013-03-20 (×2): qty 1

## 2013-03-20 MED ORDER — MIDAZOLAM HCL 5 MG/5ML IJ SOLN
INTRAMUSCULAR | Status: DC | PRN
  Administered 2013-03-20: 2 mg via INTRAVENOUS

## 2013-03-20 MED ORDER — OXYCODONE HCL 5 MG/5ML PO SOLN: 5.0000 mg | Freq: Once | ORAL | Status: AC | PRN

## 2013-03-20 MED ORDER — DEXAMETHASONE SODIUM PHOSPHATE 4 MG/ML IJ SOLN
INTRAMUSCULAR | Status: DC | PRN
  Administered 2013-03-20: 4 mg via INTRAVENOUS

## 2013-03-20 MED ORDER — KCL IN DEXTROSE-NACL 20-5-0.9 MEQ/L-%-% IV SOLN
INTRAVENOUS | Status: DC
  Administered 2013-03-20: 17:00:00 via INTRAVENOUS
  Filled 2013-03-20 (×2): qty 1000

## 2013-03-20 MED ORDER — HYDROCODONE-ACETAMINOPHEN 5-325 MG PO TABS
1.0000 | ORAL_TABLET | ORAL | Status: DC | PRN
  Administered 2013-03-21: 1 via ORAL
  Filled 2013-03-20: qty 1

## 2013-03-20 MED ORDER — SIMVASTATIN 10 MG PO TABS
10.0000 mg | ORAL_TABLET | Freq: Every day | ORAL | Status: DC
  Administered 2013-03-20: 10 mg via ORAL
  Filled 2013-03-20 (×2): qty 1

## 2013-03-20 SURGICAL SUPPLY — 42 items
APPLIER CLIP 9.375 MED OPEN (MISCELLANEOUS) ×2
BINDER BREAST LRG (GAUZE/BANDAGES/DRESSINGS) IMPLANT
BINDER BREAST XLRG (GAUZE/BANDAGES/DRESSINGS) ×2 IMPLANT
CANISTER SUCTION 2500CC (MISCELLANEOUS) ×2 IMPLANT
CHLORAPREP W/TINT 26ML (MISCELLANEOUS) ×2 IMPLANT
CLIP APPLIE 9.375 MED OPEN (MISCELLANEOUS) ×1 IMPLANT
CLOTH BEACON ORANGE TIMEOUT ST (SAFETY) ×2 IMPLANT
CONT SPEC 4OZ CLIKSEAL STRL BL (MISCELLANEOUS) ×2 IMPLANT
COVER SURGICAL LIGHT HANDLE (MISCELLANEOUS) ×2 IMPLANT
DERMABOND ADVANCED (GAUZE/BANDAGES/DRESSINGS) ×1
DERMABOND ADVANCED .7 DNX12 (GAUZE/BANDAGES/DRESSINGS) ×1 IMPLANT
DRAIN CHANNEL 19F RND (DRAIN) ×2 IMPLANT
DRAPE LAPAROSCOPIC ABDOMINAL (DRAPES) ×2 IMPLANT
DRAPE UTILITY 15X26 W/TAPE STR (DRAPE) ×4 IMPLANT
DRSG PAD ABDOMINAL 8X10 ST (GAUZE/BANDAGES/DRESSINGS) ×2 IMPLANT
ELECT COATED BLADE 2.86 ST (ELECTRODE) ×2 IMPLANT
ELECT REM PT RETURN 9FT ADLT (ELECTROSURGICAL) ×2
ELECTRODE REM PT RTRN 9FT ADLT (ELECTROSURGICAL) ×1 IMPLANT
EVACUATOR SILICONE 100CC (DRAIN) ×2 IMPLANT
GLOVE BIO SURGEON STRL SZ7 (GLOVE) ×2 IMPLANT
GLOVE BIO SURGEON STRL SZ7.5 (GLOVE) ×2 IMPLANT
GLOVE BIOGEL PI IND STRL 6.5 (GLOVE) ×1 IMPLANT
GLOVE BIOGEL PI IND STRL 7.0 (GLOVE) ×1 IMPLANT
GLOVE BIOGEL PI INDICATOR 6.5 (GLOVE) ×1
GLOVE BIOGEL PI INDICATOR 7.0 (GLOVE) ×1
GLOVE SS BIOGEL STRL SZ 6.5 (GLOVE) ×1 IMPLANT
GLOVE SUPERSENSE BIOGEL SZ 6.5 (GLOVE) ×1
GOWN STRL NON-REIN LRG LVL3 (GOWN DISPOSABLE) ×4 IMPLANT
KIT BASIN OR (CUSTOM PROCEDURE TRAY) ×2 IMPLANT
KIT ROOM TURNOVER OR (KITS) ×2 IMPLANT
NS IRRIG 1000ML POUR BTL (IV SOLUTION) ×2 IMPLANT
PACK GENERAL/GYN (CUSTOM PROCEDURE TRAY) ×2 IMPLANT
PAD ARMBOARD 7.5X6 YLW CONV (MISCELLANEOUS) ×2 IMPLANT
SPECIMEN JAR X LARGE (MISCELLANEOUS) ×2 IMPLANT
SPONGE GAUZE 4X4 12PLY (GAUZE/BANDAGES/DRESSINGS) ×2 IMPLANT
SUT ETHILON 3 0 FSL (SUTURE) ×2 IMPLANT
SUT MON AB 4-0 PC3 18 (SUTURE) ×2 IMPLANT
SUT VIC AB 3-0 54X BRD REEL (SUTURE) IMPLANT
SUT VIC AB 3-0 BRD 54 (SUTURE)
SUT VIC AB 3-0 SH 18 (SUTURE) ×2 IMPLANT
TOWEL OR 17X24 6PK STRL BLUE (TOWEL DISPOSABLE) ×2 IMPLANT
TOWEL OR 17X26 10 PK STRL BLUE (TOWEL DISPOSABLE) ×2 IMPLANT

## 2013-03-20 NOTE — Anesthesia Preprocedure Evaluation (Addendum)
Anesthesia Evaluation  Patient identified by MRN, date of birth, ID band Patient awake    Reviewed: Allergy & Precautions, H&P , NPO status , Patient's Chart, lab work & pertinent test results  Airway Mallampati: I  Neck ROM: Full    Dental  (+) Dental Advisory Given and Teeth Intact   Pulmonary neg pulmonary ROS,  breath sounds clear to auscultation        Cardiovascular negative cardio ROS  Rhythm:Regular Rate:Normal     Neuro/Psych PSYCHIATRIC DISORDERS Anxiety    GI/Hepatic negative GI ROS, Neg liver ROS,   Endo/Other  negative endocrine ROS  Renal/GU negative Renal ROS     Musculoskeletal negative musculoskeletal ROS (+) Arthritis -,   Abdominal   Peds  Hematology negative hematology ROS (+)   Anesthesia Other Findings   Reproductive/Obstetrics                          Anesthesia Physical Anesthesia Plan  ASA: I  Anesthesia Plan: General   Post-op Pain Management:    Induction: Intravenous  Airway Management Planned: LMA  Additional Equipment:   Intra-op Plan:   Post-operative Plan: Extubation in OR  Informed Consent: I have reviewed the patients History and Physical, chart, labs and discussed the procedure including the risks, benefits and alternatives for the proposed anesthesia with the patient or authorized representative who has indicated his/her understanding and acceptance.   Dental advisory given  Plan Discussed with: CRNA and Surgeon  Anesthesia Plan Comments:         Anesthesia Quick Evaluation

## 2013-03-20 NOTE — Preoperative (Signed)
Beta Blockers   Reason not to administer Beta Blockers:Not Applicable 

## 2013-03-20 NOTE — Op Note (Signed)
03/20/2013  1:16 PM  PATIENT:  Gina Johns  57 y.o. female  PRE-OPERATIVE DIAGNOSIS:  right breast dcis  POST-OPERATIVE DIAGNOSIS:  Right Breast Ductal Carcinoma Insitu  PROCEDURE:  Procedure(s): TOTAL MASTECTOMY (Right)  SURGEON:  Surgeon(s) and Role:    * Robyne Askew, MD - Primary  PHYSICIAN ASSISTANT:   ASSISTANTS: none   ANESTHESIA:   general  EBL:  Total I/O In: 1000 [I.V.:1000] Out: 25 [Blood:25]  BLOOD ADMINISTERED:none  DRAINS: (1) Jackson-Pratt drain(s) with closed bulb suction in the prepectoral space   LOCAL MEDICATIONS USED:  NONE  SPECIMEN:  Source of Specimen:  right breast  DISPOSITION OF SPECIMEN:  PATHOLOGY  COUNTS:  YES  TOURNIQUET:  * No tourniquets in log *  DICTATION: .Dragon Dictation After informed consent was obtained the patient was brought to the operating room and placed in the supine position on the operating room table. After adequate induction of general anesthesia the patient's right chest and breast were prepped with ChloraPrep, allowed to dry, and draped in usual sterile manner. An elliptical incision was mapped out around the nipple in areola in a manner to minimize the excess skin. An incision was then made along these lines with a 10 blade knife. The incision was carried through the skin and subcutaneous tissue sharply with the electrocautery. Breast hooks were then used to elevate the skin flaps anteriorly towards the ceiling. The dissection was then carried between the subcutaneous fat and the breast fat circumferentially until the dissection reached the chest wall. As this was accomplished all the way around the incision then the breast was removed from the chest wall with the pectoralis fascia. This dissection was also done with the electrocautery. Once the breast was removed it was oriented with a stitch on the lateral aspect and sent to pathology for further evaluation. There was some dense scarring near the axilla from her  previous dissection and a small portion of this was sent to pathology for frozen section which was negative. Once the breast was removed the operative bed was examined and found to be hemostatic. The operative bed was irrigated with copious amounts of saline. A small stab incision was made in the anterior axillary line beneath the operative area with a 15 blade knife. A hemostat was placed through this opening into the operative bed and used to bring a 19 [place] round Blake drain into the operative bed. The drain was curled along the chest wall. The drain was anchored to the skin with a 3-0 nylon stitch. The superior and inferior skin flaps were then grossly reapproximated with interrupted 3-0 Vicryl stitches. The skin was then closed with a running 4-0 Monocryl subcuticular stitch. Dermabond dressings were applied. The drain was placed to bulb suction and there was a good seal. The skin flaps all appeared to be healthy. The patient tolerated the procedure well. At the end of the case all needle sponge and instrument counts were correct. The patient was then awakened and taken to recovery in stable condition.  PLAN OF CARE: Admit for overnight observation  PATIENT DISPOSITION:  PACU - hemodynamically stable.   Delay start of Pharmacological VTE agent (>24hrs) due to surgical blood loss or risk of bleeding: not applicable

## 2013-03-20 NOTE — Transfer of Care (Signed)
Immediate Anesthesia Transfer of Care Note  Patient: Gina Johns  Procedure(s) Performed: Procedure(s): TOTAL MASTECTOMY (Right)  Patient Location: PACU  Anesthesia Type:General  Level of Consciousness: awake, alert  and oriented  Airway & Oxygen Therapy: Patient Spontanous Breathing and Patient connected to nasal cannula oxygen  Post-op Assessment: Report given to PACU RN, Post -op Vital signs reviewed and stable and Patient moving all extremities X 4  Post vital signs: Reviewed and stable  Complications: No apparent anesthesia complications

## 2013-03-20 NOTE — Interval H&P Note (Signed)
History and Physical Interval Note:  03/20/2013 11:24 AM  Gina Johns  has presented today for surgery, with the diagnosis of right breast dcis  The various methods of treatment have been discussed with the patient and family. After consideration of risks, benefits and other options for treatment, the patient has consented to  Procedure(s): TOTAL MASTECTOMY (Right) as a surgical intervention .  The patient's history has been reviewed, patient examined, no change in status, stable for surgery.  I have reviewed the patient's chart and labs.  Questions were answered to the patient's satisfaction.     TOTH III,Rahsaan Weakland S

## 2013-03-20 NOTE — Anesthesia Postprocedure Evaluation (Signed)
  Anesthesia Post-op Note  Patient: Gina Johns  Procedure(s) Performed: Procedure(s): TOTAL MASTECTOMY (Right)  Patient Location: PACU  Anesthesia Type:General  Level of Consciousness: awake  Airway and Oxygen Therapy: Patient Spontanous Breathing  Post-op Pain: mild  Post-op Assessment: Post-op Vital signs reviewed  Post-op Vital Signs: stable  Complications: No apparent anesthesia complications

## 2013-03-20 NOTE — Anesthesia Procedure Notes (Signed)
Procedure Name: LMA Insertion Date/Time: 03/20/2013 11:53 AM Performed by: Elon Alas Pre-anesthesia Checklist: Patient identified, Timeout performed, Emergency Drugs available, Suction available and Patient being monitored Patient Re-evaluated:Patient Re-evaluated prior to inductionOxygen Delivery Method: Circle system utilized Preoxygenation: Pre-oxygenation with 100% oxygen Intubation Type: IV induction Ventilation: Mask ventilation without difficulty LMA: LMA inserted LMA Size: 4.0 Number of attempts: 1 Placement Confirmation: positive ETCO2 and breath sounds checked- equal and bilateral Tube secured with: Tape Dental Injury: Teeth and Oropharynx as per pre-operative assessment

## 2013-03-20 NOTE — H&P (View-Only) (Signed)
Patient ID: Gina Johns, female   DOB: May 07, 1956, 57 y.o.   MRN: [id number]  No chief complaint on file.   HPI Gina Johns is a 57 y.o. female.  We are asked to see the pt in consultation by Dr. Deboraha Sprang to evaluate her for right breast dcis. The pt is a 57 yo [place] female who has a history of right lumpectomy and axillary lymph node dissection in June of 2013 in [place]. She recently went for a follow up mammogram and was found to have a large area of calcifications throughout the upper right breast. This was biopsied and came back as dcis. She denies any breast pain or discharge from the nipple.   HPI  Past Medical History  Diagnosis Date  . Breast cancer 12/07/2011    right breast 2x3 cm  . Right knee pain 08/26/2012    Past Surgical History  Procedure Laterality Date  . Breast lumpectomy with axillary lymph node biopsy      Family History  Problem Relation Age of Onset  . Cancer Sister 59    acute leukemia  . Hypertension Sister   . Hypertension Mother     Social History History  Substance Use Topics  . Smoking status: Never Smoker   . Smokeless tobacco: Never Used  . Alcohol Use: No    No Known Allergies  Current Outpatient Prescriptions  Medication Sig Dispense Refill  . azithromycin (ZITHROMAX Z-PAK) 250 MG tablet Take as directed on pack  6 each  0  . Cholecalciferol POWD 60,000 Int'l Units by Does not apply route every 30 (thirty) days.      . diclofenac (VOLTAREN) 75 MG EC tablet Take 1 tablet (75 mg total) by mouth 2 (two) times daily.  20 tablet  1  . guaiFENesin-codeine (ROBITUSSIN AC) 100-10 MG/5ML syrup Take 10 mLs by mouth 4 (four) times daily as needed for cough.  180 mL  0  . letrozole (FEMARA) 2.5 MG tablet Take 1 tablet (2.5 mg total) by mouth daily.  90 tablet  12  . Nutritional Supplements (VITAMIN D MAINTENANCE PO) Take by mouth.      . pravastatin (PRAVACHOL) 20 MG tablet Take 1 tablet (20 mg total) by mouth at bedtime.  30 tablet  2  .  RANITIDINE HCL PO Take 50 mg by mouth as needed.      . silver sulfADIAZINE (SILVADENE) 1 % cream Apply topically 2 (two) times daily. After washing thoroughly  50 g  0   No current facility-administered medications for this visit.    Review of Systems Review of Systems  Constitutional: Negative.   HENT: Negative.   Eyes: Negative.   Respiratory: Negative.   Cardiovascular: Negative.   Gastrointestinal: Negative.   Endocrine: Negative.   Genitourinary: Negative.   Musculoskeletal: Negative.   Skin: Negative.   Allergic/Immunologic: Negative.   Neurological: Negative.   Hematological: Negative.   Psychiatric/Behavioral: Negative.     Last menstrual period 03/04/2013.  Physical Exam Physical Exam  Constitutional: She is oriented to person, place, and time. She appears well-developed and well-nourished.  HENT:  Head: Normocephalic and atraumatic.  Eyes: Conjunctivae and EOM are normal. Pupils are equal, round, and reactive to light.  Neck: Normal range of motion. Neck supple.  Cardiovascular: Normal rate, regular rhythm and normal heart sounds.   Pulmonary/Chest: Effort normal and breath sounds normal.  She has a well healed scar in the upper right breast. There is no palpable mass in either breast. There is no  palpable axillary, supraclavicular, or cervical lymphadenopathy.  Abdominal: Soft. Bowel sounds are normal. She exhibits no mass. There is no tenderness.  Musculoskeletal: Normal range of motion.  Lymphadenopathy:    She has no cervical adenopathy.  Neurological: She is alert and oriented to person, place, and time.  Skin: Skin is warm and dry.  Psychiatric: She has a normal mood and affect. Her behavior is normal.    Data Reviewed As above  Assessment    The pt appears to have a large area of dcis in the right breast. Since she has had radiation in the past she is no longer a candidate for breast conservation. I have discussed with her in detail the different  options for surgery and she will need a right mastectomy. I have discussed with her the risks and benefits of surgery as well as some of the technical aspects and she understands and wishes to proceed     Plan    Plan for right mastectomy        TOTH III,Lakeyn Dokken S 03/11/2013, 4:14 PM

## 2013-03-21 NOTE — Discharge Summary (Signed)
Physician Discharge Summary  Patient ID: Gina Johns MRN: [id number] DOB/AGE: [birth year] 57 y.o.  Admit date: 03/20/2013 Discharge date: 03/21/2013  Admission Diagnoses:  Discharge Diagnoses:  Active Problems:   * No active hospital problems. *   Discharged Condition: good  Hospital Course: the pt underwent right mastectomy. She tolerated the surgery well. On pod 1 she was ready for discharge home  Consults: None  Significant Diagnostic Studies: none  Treatments: surgery: as above  Discharge Exam: Blood pressure 91/42, pulse 72, temperature 98 F (36.7 C), temperature source Oral, resp. rate 16, height 5\' 4"  (1.626 m), weight 161 lb 2.5 oz (73.1 kg), last menstrual period 03/04/2013, SpO2 100.00%. Chest wall: skin flaps look good. drain output serosang  Disposition: 01-Home or Self Care  Discharge Orders   Future Appointments Provider Department Dept Phone   03/31/2013 11:15 AM Victorino December, MD [facility] MEDICAL ONCOLOGY [phone]   06/15/2013 2:00 PM Krista Blue [facility] MEDICAL ONCOLOGY [phone]   06/15/2013 2:30 PM Victorino December, MD  [facility] MEDICAL ON[facility] [phone]   Future Orders Complete By Expires   Call MD for:  difficulty breathing, headache or visual disturbances  As directed    Call MD for:  extreme fatigue  As directed    Call MD for:  hives  As directed    Call MD for:  persistant dizziness or light-headedness  As directed    Call MD for:  persistant nausea and vomiting  As directed    Call MD for:  redness, tenderness, or signs of infection (pain, swelling, redness, odor or green/yellow discharge around incision site)  As directed    Call MD for:  severe uncontrolled pain  As directed    Call MD for:  temperature >100.4  As directed    Diet - low sodium heart healthy  As directed    Discharge instructions  As directed    Comments:     Sponge bathe until drain is removed. No overhead  activity. Empty drain and record output then recharge bulb twice a day   Increase activity slowly  As directed    No wound care  As directed        Medication List         letrozole 2.5 MG tablet  Commonly known as:  FEMARA  Take 1 tablet (2.5 mg total) by mouth daily.     pravastatin 20 MG tablet  Commonly known as:  PRAVACHOL  Take 20 mg by mouth every other day.           Follow-up Information   Follow up with Robyne Askew, MD In 1 week.   Specialty:  General Surgery   Contact information:   [address] [place] [address] [phone]       Signed: Robyne Askew 03/21/2013, 7:43 AM

## 2013-03-21 NOTE — Progress Notes (Signed)
Discherge patient. Home discharge instruction given, no question verbalized.

## 2013-03-21 NOTE — Progress Notes (Signed)
1 Day Post-Op  Subjective: No complaints  Objective: Vital signs in last 24 hours: Temp:  [97.2 F (36.2 C)-98 F (36.7 C)] 98 F (36.7 C) (09/27 0547) Pulse Rate:  [58-87] 72 (09/27 0547) Resp:  [11-30] 16 (09/27 0547) BP: (91-164)/(42-89) 91/42 mmHg (09/27 0547) SpO2:  [99 %-100 %] 100 % (09/27 0547) Weight:  [161 lb 2.5 oz (73.1 kg)] 161 lb 2.5 oz (73.1 kg) (09/26 1435) Last BM Date: 03/19/13  Intake/Output from previous day: 09/26 0701 - 09/27 0700 In: 2660.8 [P.O.:990; I.V.:1670.8] Out: 105 [Drains:80; Blood:25] Intake/Output this shift:    Chest wall: skin flaps looks healthy. drain output serosang  Lab Results:  No results found for this basename: WBC, HGB, HCT, PLT,  in the last 72 hours BMET No results found for this basename: NA, K, CL, CO2, GLUCOSE, BUN, CREATININE, CALCIUM,  in the last 72 hours PT/INR No results found for this basename: LABPROT, INR,  in the last 72 hours ABG No results found for this basename: PHART, PCO2, PO2, HCO3,  in the last 72 hours  Studies/Results: No results found.  Anti-infectives: Anti-infectives   Start     Dose/Rate Route Frequency Ordered Stop   03/20/13 0600  ceFAZolin (ANCEF) IVPB 2 g/50 mL premix     2 g 100 mL/hr over 30 Minutes Intravenous On call to O.R. 03/19/13 1441 03/20/13 1157      Assessment/Plan: s/p Procedure(s): TOTAL MASTECTOMY (Right) Advance diet Discharge  LOS: 1 day    TOTH III,PAUL S 03/21/2013

## 2013-03-24 ENCOUNTER — Telehealth (INDEPENDENT_AMBULATORY_CARE_PROVIDER_SITE_OTHER): Payer: Self-pay | Admitting: *Deleted

## 2013-03-24 ENCOUNTER — Encounter (HOSPITAL_COMMUNITY): Payer: Self-pay | Admitting: General Surgery

## 2013-03-24 ENCOUNTER — Telehealth: Payer: Self-pay | Admitting: Oncology

## 2013-03-24 ENCOUNTER — Telehealth (INDEPENDENT_AMBULATORY_CARE_PROVIDER_SITE_OTHER): Payer: Self-pay

## 2013-03-24 NOTE — Telephone Encounter (Signed)
Error

## 2013-03-24 NOTE — Telephone Encounter (Signed)
Patient states DR. Carolynne Edouard ask for her to call to scedule a appointment this week. She does have one drain sx 03-20-13. 1st available is 04-06-13 please advise

## 2013-03-24 NOTE — Telephone Encounter (Signed)
Patient called back and was given below message.  Patient states understanding at this time. 

## 2013-03-24 NOTE — Telephone Encounter (Signed)
Pt called and given appt.

## 2013-03-24 NOTE — Telephone Encounter (Signed)
LMOM> hold off on exercises until follow up visit next week.

## 2013-03-24 NOTE — Telephone Encounter (Signed)
Pt wants Gina Johns to call her back. I asked what the message was and the pt wants to know when she can do the arm exercises after mastectomy. Pls advise.

## 2013-03-25 ENCOUNTER — Encounter: Payer: Self-pay | Admitting: *Deleted

## 2013-03-25 ENCOUNTER — Telehealth (HOSPITAL_COMMUNITY): Payer: Self-pay | Admitting: *Deleted

## 2013-03-25 NOTE — Telephone Encounter (Signed)
Patient stopped by [facility] clinic yesterday with two bills from her follow up. Both bills are not covered by [facility]. Gina Johns completed patients Medicaid paperwork a couple of weeks ago. Therefore patients Medicaid is pending approval. Medicaid will cover the bills brought if approved. Called patient left a message. Patient called me back and left a voicemail. Called patient back and discussed that she would need to call Gina Johns for all of her physician bills and Gina Johns for hospital bills. Have sent both of them emails. Gave patient both of their contact numbers. Let her know that we have faxed everything to DSS. Explained the billing and Medicaid process to patient. Let patient know that if has any other questions that she can call me. Patient verbalized understanding.

## 2013-03-25 NOTE — Progress Notes (Signed)
[facility] Psychosocial Distress Screening Clinical Social Work  Clinical Social Work was referred by distress screening protocol.  The patient scored a 8 on the Psychosocial Distress Thermometer which indicates severe distress. Clinical Social Worker called Pt to assess for distress and other psychosocial needs. Pt reports feeling much less distress currently since she has had her surgery. She denies current feelings of anxiety and depression which were an issue for her prior to having a tx plan and her surgery. Pt would like to meet with a CSW to discuss resources further. Very hard to talk over the phone.   Clinical Social Worker follow up needed: yes  If yes, follow up plan: Check in on Pt at next appt on 10/7.  Doreen Salvage, LCSW Clinical Social Worker [facility] Wednesday, Thursday and Friday Phone: [phone] Fax: [phone]

## 2013-03-27 ENCOUNTER — Telehealth (INDEPENDENT_AMBULATORY_CARE_PROVIDER_SITE_OTHER): Payer: Self-pay

## 2013-03-27 NOTE — Telephone Encounter (Signed)
LMOM> path shows clear margins

## 2013-03-27 NOTE — Telephone Encounter (Signed)
Pt called and was given pathology results.  Reminded of post op appt.

## 2013-03-27 NOTE — Telephone Encounter (Signed)
Message copied by Brennan Bailey on Fri Mar 27, 2013 12:47 PM ------      Message from: Caleen Essex III      Created: Thu Mar 26, 2013  3:11 PM       All dcis. Margins are clean ------

## 2013-03-31 ENCOUNTER — Ambulatory Visit (HOSPITAL_BASED_OUTPATIENT_CLINIC_OR_DEPARTMENT_OTHER): Payer: Medicaid Other | Admitting: Oncology

## 2013-03-31 ENCOUNTER — Telehealth: Payer: Self-pay | Admitting: *Deleted

## 2013-03-31 ENCOUNTER — Encounter: Payer: Self-pay | Admitting: Oncology

## 2013-03-31 VITALS — BP 122/78 | HR 66 | Temp 97.6°F | Resp 19 | Ht 64.0 in | Wt 151.5 lb

## 2013-03-31 DIAGNOSIS — C50919 Malignant neoplasm of unspecified site of unspecified female breast: Secondary | ICD-10-CM

## 2013-03-31 DIAGNOSIS — C50911 Malignant neoplasm of unspecified site of right female breast: Secondary | ICD-10-CM

## 2013-03-31 DIAGNOSIS — C50411 Malignant neoplasm of upper-outer quadrant of right female breast: Secondary | ICD-10-CM

## 2013-03-31 DIAGNOSIS — I890 Lymphedema, not elsewhere classified: Secondary | ICD-10-CM

## 2013-03-31 DIAGNOSIS — F411 Generalized anxiety disorder: Secondary | ICD-10-CM

## 2013-03-31 DIAGNOSIS — Z170 Estrogen receptor positive status [ER+]: Secondary | ICD-10-CM

## 2013-03-31 NOTE — Telephone Encounter (Signed)
appts made and printed...td 

## 2013-03-31 NOTE — Progress Notes (Signed)
OFFICE PROGRESS NOTE  CC  No PCP Per Patient [address] [place] [address]  DIAGNOSIS: 57 year old female with invasive ductal carcinoma of the right breast diagnosed in June 2013 in [place].  STAGE:  Clinical stage II (T2 N0)   PRIOR THERAPY: #1Patient had her first mammogram one year ago which was normal. Patient travel to [place] over the summer and noted a mass in the right upper quadrant of her right breast. 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 On mammogram the mass measured about 2 x 2 by 1.5 cm.   #2 Patient went on to have a lump the mean with axillary lymph node dissection on 11/30/2011. The final pathology revealed a 2.0 cm invasive ductal carcinoma that was grade 2:15 axillary lymph nodes were negative for metastatic disease. Tumor was ER positive PR positive HER-2/neu negative Ki-67 10%. Postoperatively patient did receive adjuvant radiation therapy.   #3She was then begun on letrozole 2.5 mg daily she has been on this now for about 3 months  CURRENT THERAPY:letrozole 2.5 mg daily  INTERVAL HISTORY: Gina Johns 57 y.o. female returns for followup visit today. She'll see [place] in the multidisciplinary breast clinic due to new diagnosis of ductal carcinoma in situ. The lesion is seen in followup is she wanted to discuss more information regarding her diagnosis. She is very concerned about not being able to go to [place] since her mother-in-law is very sick. She is also concerned about her finances. She understands that we're doing everything that we possibly can to get her to surgery. We discussed different way to find have seen her surgical procedure. She is being seen by our social worker to help with the finances. 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 I do think that this is just residual disease from her prior surgery via. However one cannot these possibly certain. She understands this. We discussed the rationale mastectomies and she has had radiation therapy. We also discussed the rationale for ongoing therapy with the letrozole since she did have invasive disease E. Diagnosed little over a year ago. We discussed lymphedema as well especially since she will be flying to [place]. Certainly she will need to see a physical therapist and have her arm wrap prior to flying to [place]. I discussed with her that she should delay her surgery and further to proceed with you on September 26 patient demonstrated understanding and she will proceed with her surgery. MEDICAL HISTORY: Past Medical History  Diagnosis Date  . Breast cancer 12/07/2011    right breast 2x3 cm  . Right knee pain 08/26/2012  . Anxiety   . Arthritis     ALLERGIES:  has No Known Allergies.  MEDICATIONS:  Current Outpatient Prescriptions  Medication Sig Dispense Refill  . pravastatin (PRAVACHOL) 20 MG tablet Take 20 mg by mouth every other day.       No current facility-administered medications for this visit.    SURGICAL HISTORY:  Past Surgical History  Procedure Laterality Date  . Breast lumpectomy with axillary lymph node biopsy    . Cesarean section      Hx: of times 2  . Total mastectomy Right 03/20/2013    Procedure: TOTAL MASTECTOMY;  Surgeon: Robyne Askew, MD;  Location[facility];  Service: General;  Laterality: Right;    REVIEW OF SYSTEMS:  Pertinent items are noted in HPI.   HEALTH MAINTENANCE:   PHYSICAL EXAMINATION: Blood pressure 160/93, pulse 80, temperature 97.9 F (36.6 C), temperature source Oral, resp. rate 20, height 5\' 3"  (1.6 m), weight 154 lb 3.2 oz (69.945 kg), last menstrual period 03/04/2013. Body mass index is 27.32 kg/(m^2). ECOG PERFORMANCE STATUS: 0 - Asymptomatic Exam was not performed today  LABORATORY DATA: Lab Results   Component Value Date   WBC 6.6 03/11/2013   HGB 13.5 03/11/2013   HCT 40.8 03/11/2013   MCV 78.7* 03/11/2013   PLT 268 03/11/2013      Chemistry      Component Value Date/Time   NA 142 03/11/2013 1245   K 4.1 03/11/2013 1245   CL 107 05/12/2012 1441   CO2 24 03/11/2013 1245   BUN 15.7 03/11/2013 1245   CREATININE 0.9 03/11/2013 1245      Component Value Date/Time   CALCIUM 9.8 03/11/2013 1245   ALKPHOS 90 03/11/2013 1245   AST 26 03/11/2013 1245   ALT 28 03/11/2013 1245   BILITOT 0.45 03/11/2013 1245       RADIOGRAPHIC STUDIES:  Dg Ankle Complete Right  12/31/2012   *RADIOLOGY REPORT*  Clinical Data: Larey Seat 2 days ago.  Lateral ankle pain.  RIGHT ANKLE - COMPLETE 3+ VIEW  Comparison: None.  Findings: Soft tissue swelling lateral malleolar region.  No underlying fracture or dislocation.  Ossific structure near the lateral malleolus is well corticated and felt not to represent result of acute fracture.  Plantar spur.  Bony overgrowth anterior inferior aspect of the tibia probably related to degenerative changes.  Small joint effusion not excluded.  IMPRESSION: Soft tissue swelling lateral malleolar region without underlying fracture or dislocation.  Please see above.   Original Report Authenticated By: Lacy Duverney, M.D  RADIOLOGY REPORT*  Clinical Data: Malignant lumpectomy of the right breast in July,  2013 in [place]. Pathology report from [place] describes  invasive ductal carcinoma (grade 2) with grade 1/2 DCIS. 15  negative right axillary lymph nodes at the time of surgery.  Subsequent radiation therapy to the right breast. Annual  evaluation.  DIGITAL DIAGNOSTIC BILATERAL MAMMOGRAM WITH CAD  Comparison: The prior mammograms from [place] are not available for  direct comparison. I have paper images of a preoperative  ultrasound dated 12/04/2011 at the time of diagnosis.  Findings:  ACR Breast Density Category b: There are scattered areas of  fibroglandular density.  CC and MLO  views of both breast, a spot tangential view of the  upper right breast at the lumpectomy site, and spot magnification  views of calcifications in the upper right breast were obtained.  The spot magnification views confirm amorphous calcifications which  are segmental and in the upper outer quadrant of the breast. As  one approaches the nipple, the calcifications are in a linear  orientation and are pleomorphic. Spot tangential view shows the  expected post lumpectomy changes in the upper right breast.  There are no findings suspicious for malignancy in the left  breast.  Mammographic images were processed with CAD.  IMPRESSION:  1. Suspicious calcifications in a segmental distribution in the  upper outer right breast.  2. Expected post lumpectomy changes in the upper right breast.  3. No mammographic evidence of malignancy, left breast.  RECOMMENDATION:  Stereotactic core needle biopsy of the suspicious calcifications is  recommended.  I have discussed the findings and recommendations with the patient  and I showed her the mammographic images during this discussion.  All of her questions were answered regarding the stereotactic  biopsy. This has been scheduled for Tuesday, September 9, at 8  o'clock a.m.  Results were also provided in writing at the conclusion of the  visit. If applicable, a reminder letter will be sent to the  patient regarding her next appointment.  BI-RADS CATEGORY 4: Suspicious abnormality - biopsy should be  considered.  Original Report Authenticated By: Hulan Saas, M.D.     *RADIOLOGY REPORT*  Clinical Data:History of prior right malignant lumpectomy July 2013  for grade II invasive ductal carcinoma and grade 1 to II DCIS.  Subsequent radiation. Recent malignant stereotactic core biopsy of  microcalcifications at the 12 o'clock position of the right breast  demonstrating DCIS 03/03/13.  No labs.  BILATERAL BREAST MRI WITH AND WITHOUT CONTRAST   Technique: Multiplanar, multisequence MR images of both breasts  were obtained prior to and following the intravenous administration  of 14ml of multihance.  THREE-DIMENSIONAL MR IMAGE RENDERING ON INDEPENDENT WORKSTATION:  Three-dimensional MR images were rendered by post-processing of  the original MR data on an independent DynaCad workstation. The  three-dimensional MR images were interpreted, and findings are  reported in the following complete MRI report for this study.  Comparison: Recent mammograms.  FINDINGS:  Breast composition: b: There are scattered areas of  fibroglandular density.  Background parenchymal enhancement: Mild  Right breast: Post surgical clip artifact is seen over the  posterior third of the upper central right breast. Minimal  adjacent scarring along the superficial aspect of the right  pectoralis muscle. There is a 1 x 1.2 cm hematoma over the  patient's recent biopsy site at the 12 o'clock position of the  right breast in the anterior third approximately 2 cm from the  nipple. Clip artifact is seen along the posterior-superior aspect  of the hematoma. There is no evidence of focal mass or abnormal  enhancement.  Left breast: No mass or abnormal enhancement.  Lymph nodes: No abnormal appearing lymph nodes.  Ancillary findings: None.  IMPRESSION:  Post biopsy clip artifact with 1.2 cm hematoma over the recent  biopsy proven DCIS at the 12 o'clock position of the right breast 2  cm from the nipple. Post surgical changes over the deep third of  the upper central right breast. No suspicious masses or  enhancement in either breast. No adenopathy.  RECOMMENDATION:  Recommend continued followup per clinical treatment plan.     ASSESSMENT: 57 year old female with  #1 T T2 N0 invasive ductal carcinoma (stage II) of the right breast status post right lumpectomy with right axillary lymph node dissection. Patient was found to have a 2.0 cm ER positive PR  positive HER-2/neu negative breast cancer 15 lymph nodes were negative for metastatic disease. She is now status post adjuvant radiation therapy and then placed on adjuvant antiestrogen therapy with letrozole.   #2 myalgias and arthralgias and mood swings secondary to her antiestrogen therapy.   #3 patient and I had an extensive discussion regarding her diagnosis of breast cancer pathology and treatment that has been given to her. We discussed extensively antiestrogen therapy specifically letrozole and side effects associated with that. I think the myalgias and arthralgias are due to the letrozole as well as the mood swings and hot flashes. We did discuss the possibility of coming off of it for about 2 weeks. In starting a different agent if her symptoms resolve. However patient is going to  [place] and at this time she wants to continue the letrozole.   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 We discussed referring her to the lymphedema clinic   PLAN:  #1 extensive discussions patient will proceed with a mastectomy of the right breast on 03/20/2013.  #2 I will see her back approximately a week later.   All questions were answered. The patient knows to call the clinic with any problems, questions or concerns. We can certainly see the patient much sooner if necessary.  I spent 40 minutes counseling  the patient face to face and coordination of careThe total time spent in the appointment was 40 minutes.    Drue Second, MD Medical/Oncology [facility] [phone] (beeper) [phone] (Office)

## 2013-04-01 ENCOUNTER — Encounter (INDEPENDENT_AMBULATORY_CARE_PROVIDER_SITE_OTHER): Payer: Self-pay | Admitting: General Surgery

## 2013-04-01 ENCOUNTER — Ambulatory Visit (INDEPENDENT_AMBULATORY_CARE_PROVIDER_SITE_OTHER): Payer: Medicaid Other | Admitting: General Surgery

## 2013-04-01 VITALS — BP 118/62 | HR 64 | Temp 98.0°F | Resp 18 | Ht 64.0 in | Wt 151.0 lb

## 2013-04-01 DIAGNOSIS — C50919 Malignant neoplasm of unspecified site of unspecified female breast: Secondary | ICD-10-CM

## 2013-04-01 DIAGNOSIS — C50911 Malignant neoplasm of unspecified site of right female breast: Secondary | ICD-10-CM

## 2013-04-01 NOTE — Patient Instructions (Signed)
Continue to record output Sponge bathe until drain is removed

## 2013-04-06 ENCOUNTER — Encounter (INDEPENDENT_AMBULATORY_CARE_PROVIDER_SITE_OTHER): Payer: Medicaid Other | Admitting: General Surgery

## 2013-04-14 MED ORDER — VENLAFAXINE HCL ER 37.5 MG PO CP24: 37.5000 mg | ORAL_CAPSULE | Freq: Every day | ORAL | Status: DC

## 2013-04-14 NOTE — Progress Notes (Signed)
OFFICE PROGRESS NOTE  CC  No PCP Per Patient [address] [place] [address]  DIAGNOSIS: 57 year old female with invasive ductal carcinoma of the right breast diagnosed in June 2013 in [place].  STAGE:  Clinical stage II (T2 N0)   PRIOR THERAPY: #1Patient had her first mammogram one year ago which was normal. Patient travel to [place] over the summer and noted a mass in the right upper quadrant of her right breast. 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 On mammogram the mass measured about 2 x 2 by 1.5 cm.   #2 Patient went on to have a lump the mean with axillary lymph node dissection on 11/30/2011. The final pathology revealed a 2.0 cm invasive ductal carcinoma that was grade 2:15 axillary lymph nodes were negative for metastatic disease. Tumor was ER positive PR positive HER-2/neu negative Ki-67 10%. Postoperatively patient did receive adjuvant radiation therapy.   #3She was then begun on letrozole 2.5 mg daily she has been on this now for about 3 months  #4 patient is now status post right mastectomy. Postoperatively she is doing well. 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  CURRENT THERAPY:letrozole 2.5 mg daily  INTERVAL HISTORY: Gina Johns 57 y.o. female returns for followup visit today. She is now postop after having her right mastectomy. Overall she's tolerated it well. She is healing very nicely. She is quite concerned about getting prosthesis as well as her right upper extremity edema. She otherwise has no complaints  MEDICAL HISTORY: Past Medical History  Diagnosis Date  . Breast cancer 12/07/2011    right breast 2x3 cm  . Right knee pain 08/26/2012  . Anxiety   . Arthritis     ALLERGIES:  has No Known Allergies.  MEDICATIONS:  Current  Outpatient Prescriptions  Medication Sig Dispense Refill  . pravastatin (PRAVACHOL) 20 MG tablet Take 20 mg by mouth every other day.       No current facility-administered medications for this visit.    SURGICAL HISTORY:  Past Surgical History  Procedure Laterality Date  . Breast lumpectomy with axillary lymph node biopsy    . Cesarean section      Hx: of times 2  . Total mastectomy Right 03/20/2013    Procedure: TOTAL MASTECTOMY;  Surgeon: Robyne Askew, MD;  Location: [facility] OR;  Service: General;  Laterality: Right;    REVIEW OF SYSTEMS:  Pertinent items are noted in HPI.   HEALTH MAINTENANCE:   PHYSICAL EXAMINATION: Blood pressure 122/78, pulse 66, temperature 97.6 F (36.4 C), temperature source Oral, resp. rate 19, height 5\' 4"  (1.626 m), weight 151 lb 8 oz (68.72 kg), last menstrual period 03/04/2013. Body mass index is 25.99 kg/(m^2). ECOG PERFORMANCE STATUS: 0 - Asymptomatic Exam was not performed today  LABORATORY DATA: Lab Results  Component Value Date   WBC 6.6 03/11/2013   HGB 13.5 03/11/2013   HCT 40.8 03/11/2013   MCV 78.7* 03/11/2013   PLT 268 03/11/2013      Chemistry      Component Value Date/Time   NA 142 03/11/2013 1245   K 4.1 03/11/2013 1245   CL 107 05/12/2012 1441   CO2 24 03/11/2013 1245   BUN 15.7 03/11/2013 1245   CREATININE 0.9 03/11/2013 1245  Component Value Date/Time   CALCIUM 9.8 03/11/2013 1245   ALKPHOS 90 03/11/2013 1245   AST 26 03/11/2013 1245   ALT 28 03/11/2013 1245   BILITOT 0.45 03/11/2013 1245       RADIOGRAPHIC STUDIES:  Dg Ankle Complete Right  12/31/2012   *RADIOLOGY REPORT*  Clinical Data: Larey Seat 2 days ago.  Lateral ankle pain.  RIGHT ANKLE - COMPLETE 3+ VIEW  Comparison: None.  Findings: Soft tissue swelling lateral malleolar region.  No underlying fracture or dislocation.  Ossific structure near the lateral malleolus is well corticated and felt not to represent result of acute fracture.  Plantar spur.  Bony overgrowth  anterior inferior aspect of the tibia probably related to degenerative changes.  Small joint effusion not excluded.  IMPRESSION: Soft tissue swelling lateral malleolar region without underlying fracture or dislocation.  Please see above.   Original Report Authenticated By: Lacy Duverney, M.D  RADIOLOGY REPORT*  Clinical Data: Malignant lumpectomy of the right breast in July,  2013 in [place]. Pathology report from [place] describes  invasive ductal carcinoma (grade 2) with grade 1/2 DCIS. 15  negative right axillary lymph nodes at the time of surgery.  Subsequent radiation therapy to the right breast. Annual  evaluation.  DIGITAL DIAGNOSTIC BILATERAL MAMMOGRAM WITH CAD  Comparison: The prior mammograms from [place] are not available for  direct comparison. I have paper images of a preoperative  ultrasound dated 12/04/2011 at the time of diagnosis.  Findings:  ACR Breast Density Category b: There are scattered areas of  fibroglandular density.  CC and MLO views of both breast, a spot tangential view of the  upper right breast at the lumpectomy site, and spot magnification  views of calcifications in the upper right breast were obtained.  The spot magnification views confirm amorphous calcifications which  are segmental and in the upper outer quadrant of the breast. As  one approaches the nipple, the calcifications are in a linear  orientation and are pleomorphic. Spot tangential view shows the  expected post lumpectomy changes in the upper right breast.  There are no findings suspicious for malignancy in the left  breast.  Mammographic images were processed with CAD.  IMPRESSION:  1. Suspicious calcifications in a segmental distribution in the  upper outer right breast.  2. Expected post lumpectomy changes in the upper right breast.  3. No mammographic evidence of malignancy, left breast.  RECOMMENDATION:  Stereotactic core needle biopsy of the suspicious calcifications is   recommended.  I have discussed the findings and recommendations with the patient  and I showed her the mammographic images during this discussion.  All of her questions were answered regarding the stereotactic  biopsy. This has been scheduled for Tuesday, September 9, at 8  o'clock a.m.  Results were also provided in writing at the conclusion of the  visit. If applicable, a reminder letter will be sent to the  patient regarding her next appointment.  BI-RADS CATEGORY 4: Suspicious abnormality - biopsy should be  considered.  Original Report Authenticated By: Hulan Saas, M.D.     *RADIOLOGY REPORT*  Clinical Data:History of prior right malignant lumpectomy July 2013  for grade II invasive ductal carcinoma and grade 1 to II DCIS.  Subsequent radiation. Recent malignant stereotactic core biopsy of  microcalcifications at the 12 o'clock position of the right breast  demonstrating DCIS 03/03/13.  No labs.  BILATERAL BREAST MRI WITH AND WITHOUT CONTRAST  Technique: Multiplanar, multisequence MR images of both breasts  were obtained  prior to and following the intravenous administration  of 14ml of multihance.  THREE-DIMENSIONAL MR IMAGE RENDERING ON INDEPENDENT WORKSTATION:  Three-dimensional MR images were rendered by post-processing of  the original MR data on an independent DynaCad workstation. The  three-dimensional MR images were interpreted, and findings are  reported in the following complete MRI report for this study.  Comparison: Recent mammograms.  FINDINGS:  Breast composition: b: There are scattered areas of  fibroglandular density.  Background parenchymal enhancement: Mild  Right breast: Post surgical clip artifact is seen over the  posterior third of the upper central right breast. Minimal  adjacent scarring along the superficial aspect of the right  pectoralis muscle. There is a 1 x 1.2 cm hematoma over the  patient's recent biopsy site at the 12 o'clock position  of the  right breast in the anterior third approximately 2 cm from the  nipple. Clip artifact is seen along the posterior-superior aspect  of the hematoma. There is no evidence of focal mass or abnormal  enhancement.  Left breast: No mass or abnormal enhancement.  Lymph nodes: No abnormal appearing lymph nodes.  Ancillary findings: None.  IMPRESSION:  Post biopsy clip artifact with 1.2 cm hematoma over the recent  biopsy proven DCIS at the 12 o'clock position of the right breast 2  cm from the nipple. Post surgical changes over the deep third of  the upper central right breast. No suspicious masses or  enhancement in either breast. No adenopathy.  RECOMMENDATION:  Recommend continued followup per clinical treatment plan.     ASSESSMENT: 57 year old female with  #1 T T2 N0 invasive ductal carcinoma (stage II) of the right breast status post right lumpectomy with right axillary lymph node dissection. Patient was found to have a 2.0 cm ER positive PR positive HER-2/neu negative breast cancer 15 lymph nodes were negative for metastatic disease. She is now status post adjuvant radiation therapy and then placed on adjuvant antiestrogen therapy with letrozole.   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 She is now status post mastectomy. Final pathology did reveal only DCIS that was ER positive. 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 We discussed referring her to the lymphedema clinic   #4 anxiety  PLAN:  #1 patient will continue letrozole 2.5 mg daily  #2 we will: Effexor 37.5 mg for her anxiety. Patient has a lot of financial obligations and anxiety. I do also think that she would benefit from counseling with social work and I will have her see one of our social workers as well.  #3 patient is given her a prescription for it is prosthesis.  #4 she'll be  seen back in a few weeks time  All questions were answered. The patient knows to call the clinic with any problems, questions or concerns. We can certainly see the patient much sooner if necessary.  I spent 20 minutes counseling  the patient face to face and coordination of careThe total time spent in the appointment was 30 minutes.    Drue Second, MD Medical/Oncology [facility] [phone] (beeper) [phone] (Office)

## 2013-04-15 ENCOUNTER — Encounter (INDEPENDENT_AMBULATORY_CARE_PROVIDER_SITE_OTHER): Payer: Self-pay | Admitting: General Surgery

## 2013-04-15 ENCOUNTER — Ambulatory Visit (INDEPENDENT_AMBULATORY_CARE_PROVIDER_SITE_OTHER): Payer: Medicaid Other | Admitting: General Surgery

## 2013-04-15 VITALS — BP 120/80 | HR 80 | Temp 97.2°F | Resp 15 | Ht 64.0 in | Wt 151.6 lb

## 2013-04-15 DIAGNOSIS — C50411 Malignant neoplasm of upper-outer quadrant of right female breast: Secondary | ICD-10-CM

## 2013-04-15 DIAGNOSIS — C50419 Malignant neoplasm of upper-outer quadrant of unspecified female breast: Secondary | ICD-10-CM

## 2013-04-15 MED ORDER — ALPRAZOLAM 0.5 MG PO TABS: 0.5000 mg | ORAL_TABLET | Freq: Every evening | ORAL | Status: DC | PRN

## 2013-04-15 MED ORDER — VENLAFAXINE HCL ER 37.5 MG PO CP24: 37.5000 mg | ORAL_CAPSULE | Freq: Every day | ORAL | Status: DC

## 2013-04-15 NOTE — Patient Instructions (Signed)
No overhead activity until next week

## 2013-04-16 ENCOUNTER — Encounter (INDEPENDENT_AMBULATORY_CARE_PROVIDER_SITE_OTHER): Payer: Self-pay | Admitting: General Surgery

## 2013-04-16 NOTE — Progress Notes (Signed)
Subjective:     Patient ID: Gina Johns, female   DOB: December 10, 1955, 57 y.o.   MRN: [id number]  HPI The patient is a 57 year old female who is a little Over one week out from a right mastectomy for DCIS. Her drains are still putting out about 50-60 cc of serous fluid a day. She denies any significant chest wall pain. Her appetite is good and her bowels are working normally.  Review of Systems     Objective:   Physical Exam On exam her right mastectomy incision is healing nicely with no sign of infection or significant seroma. Her skin flaps are healthy    Assessment:     The patient is just over one week out from a right mastectomy for DCIS     Plan:     At this point she will continue to avoid any heavy lifting. She will continue to record her output from the drains. I will plan to see her back in one week to check her progress

## 2013-04-16 NOTE — Progress Notes (Signed)
Subjective:     Patient ID: Gina Johns, female   DOB: [birth year], 57 y.o.   MRN: [id number]  HPI The patient is a 57 year old female who is over 3 weeks out from a right mastectomy for DCIS. She is ER positive. She still has her remaining drain and which has put out between 25 and 35 cc a day. She denies any swelling of her arm. She has some mild chest wall discomfort.  Review of Systems     Objective:   Physical Exam On exam her right mastectomy incision is healing nicely with no sign of infection or seroma. Her drain was removed without difficulty today.    Assessment:     The patient is just over 3 weeks status post right mastectomy for DCIS     Plan:     At this point I will plan to see her back next week to check for any residual fluid. At that point I will refer her to physical therapy. She will continue to followup with medical oncology. She will continue her antiestrogen medicine. She is getting ready to leave for in the on November 21 and should probably be fitted for compression sleeve before she goes.

## 2013-04-17 ENCOUNTER — Other Ambulatory Visit: Payer: Self-pay | Admitting: Oncology

## 2013-04-20 ENCOUNTER — Telehealth: Payer: Self-pay | Admitting: Oncology

## 2013-04-20 NOTE — Telephone Encounter (Signed)
, °

## 2013-04-21 ENCOUNTER — Ambulatory Visit (INDEPENDENT_AMBULATORY_CARE_PROVIDER_SITE_OTHER): Payer: Medicaid Other | Admitting: General Surgery

## 2013-04-21 ENCOUNTER — Encounter (INDEPENDENT_AMBULATORY_CARE_PROVIDER_SITE_OTHER): Payer: Self-pay | Admitting: General Surgery

## 2013-04-21 ENCOUNTER — Other Ambulatory Visit (INDEPENDENT_AMBULATORY_CARE_PROVIDER_SITE_OTHER): Payer: Self-pay | Admitting: *Deleted

## 2013-04-21 ENCOUNTER — Telehealth: Payer: Self-pay | Admitting: *Deleted

## 2013-04-21 VITALS — BP 112/78 | HR 76 | Temp 97.7°F | Resp 15 | Ht 64.0 in | Wt 153.0 lb

## 2013-04-21 DIAGNOSIS — C50919 Malignant neoplasm of unspecified site of unspecified female breast: Secondary | ICD-10-CM

## 2013-04-21 DIAGNOSIS — C50911 Malignant neoplasm of unspecified site of right female breast: Secondary | ICD-10-CM

## 2013-04-21 MED ORDER — UNABLE TO FIND: Status: DC

## 2013-04-21 NOTE — Telephone Encounter (Signed)
Pt appt has been changed. Per pt request

## 2013-04-21 NOTE — Telephone Encounter (Signed)
Message copied by Cooper Render on Tue Apr 21, 2013  9:03 AM ------      Message from: Victorino December      Created: Fri Apr 17, 2013  5:21 PM      Regarding: RE: r/s appt for earlier date       Patient can be re-scheduled on 11/4 at 3:00      ----- Message -----         From: Jolinda Croak, RN         Sent: 04/17/2013   4:38 PM           To: Victorino December, MD      Subject: r/s appt for earlier date                                Pt called states " I will be on travel on Nov 20th and I need to see Dr. Welton Flakes before I leave."      Pt taking letrozole 2.5mg  last seen 10/7       pt currently has an appt 11/24      Please advise thanks, Corrie Dandy       ------

## 2013-04-23 ENCOUNTER — Other Ambulatory Visit (INDEPENDENT_AMBULATORY_CARE_PROVIDER_SITE_OTHER): Payer: Self-pay | Admitting: General Surgery

## 2013-04-23 ENCOUNTER — Other Ambulatory Visit (INDEPENDENT_AMBULATORY_CARE_PROVIDER_SITE_OTHER): Payer: Self-pay

## 2013-04-23 ENCOUNTER — Telehealth (INDEPENDENT_AMBULATORY_CARE_PROVIDER_SITE_OTHER): Payer: Self-pay

## 2013-04-23 ENCOUNTER — Encounter (INDEPENDENT_AMBULATORY_CARE_PROVIDER_SITE_OTHER): Payer: Medicaid Other | Admitting: General Surgery

## 2013-04-23 DIAGNOSIS — C50911 Malignant neoplasm of unspecified site of right female breast: Secondary | ICD-10-CM

## 2013-04-23 NOTE — Telephone Encounter (Signed)
Referral faxed over

## 2013-04-23 NOTE — Telephone Encounter (Signed)
Gina Johns at [facility] rehab called needing new referral for PT put in by Dr Carolynne Edouard. Pt called them and has appt Monday to start rehab. I advised her I will send request to Dr Carolynne Edouard and his assistant for order.

## 2013-04-27 ENCOUNTER — Encounter (INDEPENDENT_AMBULATORY_CARE_PROVIDER_SITE_OTHER): Payer: Self-pay | Admitting: General Surgery

## 2013-04-27 ENCOUNTER — Ambulatory Visit: Payer: Medicaid Other | Attending: General Surgery | Admitting: Physical Therapy

## 2013-04-27 ENCOUNTER — Encounter: Payer: Self-pay | Admitting: Physical Therapy

## 2013-04-27 ENCOUNTER — Ambulatory Visit (INDEPENDENT_AMBULATORY_CARE_PROVIDER_SITE_OTHER): Payer: Medicaid Other | Admitting: General Surgery

## 2013-04-27 VITALS — BP 121/62 | HR 60 | Temp 98.6°F | Resp 14 | Ht 64.0 in | Wt 151.6 lb

## 2013-04-27 DIAGNOSIS — IMO0001 Reserved for inherently not codable concepts without codable children: Secondary | ICD-10-CM | POA: Insufficient documentation

## 2013-04-27 DIAGNOSIS — M24519 Contracture, unspecified shoulder: Secondary | ICD-10-CM | POA: Insufficient documentation

## 2013-04-27 DIAGNOSIS — C50919 Malignant neoplasm of unspecified site of unspecified female breast: Secondary | ICD-10-CM

## 2013-04-27 DIAGNOSIS — C50911 Malignant neoplasm of unspecified site of right female breast: Secondary | ICD-10-CM

## 2013-04-27 DIAGNOSIS — I890 Lymphedema, not elsewhere classified: Secondary | ICD-10-CM | POA: Insufficient documentation

## 2013-04-27 NOTE — Patient Instructions (Signed)
May participate with PT

## 2013-04-28 ENCOUNTER — Encounter: Payer: Self-pay | Admitting: Oncology

## 2013-04-28 ENCOUNTER — Other Ambulatory Visit (HOSPITAL_BASED_OUTPATIENT_CLINIC_OR_DEPARTMENT_OTHER): Payer: Medicaid Other | Admitting: Lab

## 2013-04-28 ENCOUNTER — Telehealth: Payer: Self-pay | Admitting: Oncology

## 2013-04-28 ENCOUNTER — Ambulatory Visit (HOSPITAL_BASED_OUTPATIENT_CLINIC_OR_DEPARTMENT_OTHER): Payer: Medicaid Other | Admitting: Oncology

## 2013-04-28 VITALS — BP 145/65 | HR 81 | Temp 98.4°F | Resp 20 | Ht 64.0 in | Wt 150.8 lb

## 2013-04-28 DIAGNOSIS — C50911 Malignant neoplasm of unspecified site of right female breast: Secondary | ICD-10-CM

## 2013-04-28 DIAGNOSIS — C50411 Malignant neoplasm of upper-outer quadrant of right female breast: Secondary | ICD-10-CM

## 2013-04-28 DIAGNOSIS — F411 Generalized anxiety disorder: Secondary | ICD-10-CM

## 2013-04-28 DIAGNOSIS — Z170 Estrogen receptor positive status [ER+]: Secondary | ICD-10-CM

## 2013-04-28 DIAGNOSIS — C50419 Malignant neoplasm of upper-outer quadrant of unspecified female breast: Secondary | ICD-10-CM

## 2013-04-28 DIAGNOSIS — R599 Enlarged lymph nodes, unspecified: Secondary | ICD-10-CM

## 2013-04-28 LAB — COMPREHENSIVE METABOLIC PANEL (CC13)
ALT: 15 U/L (ref 0–55)
AST: 14 U/L (ref 5–34)
Albumin: 3.7 g/dL (ref 3.5–5.0)
Alkaline Phosphatase: 87 U/L (ref 40–150)
Anion Gap: 16 mEq/L — ABNORMAL HIGH (ref 3–11)
Chloride: 104 mEq/L (ref 98–109)
Glucose: 85 mg/dl (ref 70–140)
Potassium: 4 mEq/L (ref 3.5–5.1)
Sodium: 141 mEq/L (ref 136–145)
Total Bilirubin: 0.46 mg/dL (ref 0.20–1.20)
Total Protein: 7.4 g/dL (ref 6.4–8.3)

## 2013-04-28 LAB — CBC WITH DIFFERENTIAL/PLATELET
BASO%: 0.8 % (ref 0.0–2.0)
EOS%: 4.6 % (ref 0.0–7.0)
Eosinophils Absolute: 0.3 10*3/uL (ref 0.0–0.5)
LYMPH%: 28.2 % (ref 14.0–49.7)
MCHC: 33.1 g/dL (ref 31.5–36.0)
MCV: 77.8 fL — ABNORMAL LOW (ref 79.5–101.0)
MONO#: 0.4 10*3/uL (ref 0.1–0.9)
MONO%: 5.2 % (ref 0.0–14.0)
Platelets: 333 10*3/uL (ref 145–400)
RBC: 4.96 10*6/uL (ref 3.70–5.45)
RDW: 12.9 % (ref 11.2–14.5)
WBC: 7.2 10*3/uL (ref 3.9–10.3)
lymph#: 2 10*3/uL (ref 0.9–3.3)

## 2013-04-28 MED ORDER — LETROZOLE 2.5 MG PO TABS: 2.5000 mg | ORAL_TABLET | Freq: Every day | ORAL | Status: DC

## 2013-04-28 NOTE — Patient Instructions (Signed)
Doing well We will see you back in 6 months (May 2015)

## 2013-04-28 NOTE — Progress Notes (Signed)
Subjective:     Patient ID: Gina Johns, female   DOB: September 20, 1955, 57 y.o.   MRN: [id number]  HPI The patient is a 57 year old female who is one month status post right mastectomy for recurrent DCIS. Since having her drain removed she has had a small amount of fluid buildup beneath her skin flap on the right. She denies any chest wall pain.  Review of Systems     Objective:   Physical Exam On exam her right mastectomy incision is healed nicely with no sign of infection. She does have some fluid that has built up laterally beneath the skin flap. The area was prepped with ChloraPrep and infiltrated with 1% lidocaine. I was able to withdraw out approximately 90 cc of serous fluid. She tolerated this well.    Assessment:     The patient is one month status post right mastectomy for DCIS     Plan:     At this point I will plan to see her back in one week to check for any further build up of fluid. She would like to also start physical therapy and I think this would be okay.

## 2013-04-28 NOTE — Progress Notes (Signed)
Subjective:     Patient ID: Gina Johns, female   DOB: 1955/07/13, 57 y.o.   MRN: [id number]  HPI The patient is a 57 -year-old female who is about 7 weeks status post right mastectomy for DCIS. She has had some seromatous after her last drain was removed. She denies any chest wall pain. She has started working with physical therapy and is tolerating that well  Review of Systems     Objective:   Physical Exam On exam her right mastectomy incision is healing nicely with no sign of infection. I prepped her skin with ChloraPrep, infiltrated with 1% lidocaine, and aspirated 60 cc of serous fluid.    Assessment:     The patient is 7 weeks status post right mastectomy for DCIS     Plan:     At this point she will continue to work with physical therapy. I will plan to see her back in one week to check for any further fluid buildup. She is still planning on leaving for [place] on November 21

## 2013-04-28 NOTE — Progress Notes (Signed)
OFFICE PROGRESS NOTE  CC  No PCP Per Patient [address] [place] [address]  DIAGNOSIS: 57 year old female with invasive ductal carcinoma of the right breast diagnosed in June 2013 in [place].  STAGE:  Clinical stage II (T2 N0)   PRIOR THERAPY: #1Patient had her first mammogram one year ago which was normal. Patient travel to [place] over the summer and noted a mass in the right upper quadrant of her right breast. 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 On mammogram the mass measured about 2 x 2 by 1.5 cm.   #2 Patient went on to have a lump the mean with axillary lymph node dissection on 11/30/2011. The final pathology revealed a 2.0 cm invasive ductal carcinoma that was grade 2:15 axillary lymph nodes were negative for metastatic disease. Tumor was ER positive PR positive HER-2/neu negative Ki-67 10%. Postoperatively patient did receive adjuvant radiation therapy.   #3She was then begun on letrozole 2.5 mg daily she has been on this now for about 3 months  #4 patient is now status post right mastectomy. Postoperatively she is doing well. 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  CURRENT THERAPY:letrozole 2.5 mg daily  INTERVAL HISTORY:  Gina Johns 57 y.o. female returns for followup visit today. Overall patient is doing well. She did develop a seroma. She was seen by Dr. Carolynne Edouard she did have some of the fluid drained. She denies any headaches double vision blurring of vision. Patient does have significant amount of anxiety. She has no peripheral paresthesias she does have hot flashes and some aches and pains from the letrozole. Remainder of the 10 point review of systems is negative.   MEDICAL HISTORY: Past Medical History  Diagnosis Date  . Breast cancer  12/07/2011    right breast 2x3 cm  . Right knee pain 08/26/2012  . Anxiety   . Arthritis     ALLERGIES:  has No Known Allergies.  MEDICATIONS:  Current Outpatient Prescriptions  Medication Sig Dispense Refill  . pravastatin (PRAVACHOL) 20 MG tablet Take 20 mg by mouth every other day.      . ALPRAZolam (XANAX) 0.5 MG tablet Take 1 tablet (0.5 mg total) by mouth at bedtime as needed for sleep.  30 tablet  0  . UNABLE TO FIND Rx: L8000-Post Surgical Bras (Quantity: 6) L8020- Non-Silicone Breast Prosthesis (Quantity: 1) Dx: 174.9; Right Mastectomy  1 each  0  . venlafaxine XR (EFFEXOR XR) 37.5 MG 24 hr capsule Take 1 capsule (37.5 mg total) by mouth daily.  30 capsule  0   No current facility-administered medications for this visit.    SURGICAL HISTORY:  Past Surgical History  Procedure Laterality Date  . Breast lumpectomy with axillary lymph node biopsy    . Cesarean section      Hx: of times 2  . Total mastectomy Right 03/20/2013    Procedure: TOTAL MASTECTOMY;  Surgeon: Robyne Askew, MD;  Location: [facility] OR;  Service: General;  Laterality: Right;    REVIEW OF SYSTEMS:  Pertinent items are noted in HPI.   HEALTH MAINTENANCE:   PHYSICAL EXAMINATION: Blood pressure 145/65, pulse 81, temperature 98.4 F (36.9 C), temperature source Oral, resp. rate 20, height 5\' 4"  (1.626 m), weight 150 lb 12.8 oz (68.402 kg), last menstrual  period 03/04/2013. Body mass index is 25.87 kg/(m^2). ECOG PERFORMANCE STATUS: 0 - Asymptomatic Exam was not performed today  LABORATORY DATA: Lab Results  Component Value Date   WBC 7.2 04/28/2013   HGB 12.8 04/28/2013   HCT 38.6 04/28/2013   MCV 77.8* 04/28/2013   PLT 333 04/28/2013      Chemistry      Component Value Date/Time   NA 142 03/11/2013 1245   K 4.1 03/11/2013 1245   CL 107 05/12/2012 1441   CO2 24 03/11/2013 1245   BUN 15.7 03/11/2013 1245   CREATININE 0.9 03/11/2013 1245      Component Value Date/Time   CALCIUM 9.8 03/11/2013 1245    ALKPHOS 90 03/11/2013 1245   AST 26 03/11/2013 1245   ALT 28 03/11/2013 1245   BILITOT 0.45 03/11/2013 1245       RADIOGRAPHIC STUDIES:  Dg Ankle Complete Right  12/31/2012   *RADIOLOGY REPORT*  Clinical Data: Larey Seat 2 days ago.  Lateral ankle pain.  RIGHT ANKLE - COMPLETE 3+ VIEW  Comparison: None.  Findings: Soft tissue swelling lateral malleolar region.  No underlying fracture or dislocation.  Ossific structure near the lateral malleolus is well corticated and felt not to represent result of acute fracture.  Plantar spur.  Bony overgrowth anterior inferior aspect of the tibia probably related to degenerative changes.  Small joint effusion not excluded.  IMPRESSION: Soft tissue swelling lateral malleolar region without underlying fracture or dislocation.  Please see above.   Original Report Authenticated By: Lacy Duverney, M.D  RADIOLOGY REPORT*  Clinical Data: Malignant lumpectomy of the right breast in July,  2013 in [place]. Pathology report from [place] describes  invasive ductal carcinoma (grade 2) with grade 1/2 DCIS. 15  negative right axillary lymph nodes at the time of surgery.  Subsequent radiation therapy to the right breast. Annual  evaluation.  DIGITAL DIAGNOSTIC BILATERAL MAMMOGRAM WITH CAD  Comparison: The prior mammograms from [place] are not available for  direct comparison. I have paper images of a preoperative  ultrasound dated 12/04/2011 at the time of diagnosis.  Findings:  ACR Breast Density Category b: There are scattered areas of  fibroglandular density.  CC and MLO views of both breast, a spot tangential view of the  upper right breast at the lumpectomy site, and spot magnification  views of calcifications in the upper right breast were obtained.  The spot magnification views confirm amorphous calcifications which  are segmental and in the upper outer quadrant of the breast. As  one approaches the nipple, the calcifications are in a linear  orientation and are  pleomorphic. Spot tangential view shows the  expected post lumpectomy changes in the upper right breast.  There are no findings suspicious for malignancy in the left  breast.  Mammographic images were processed with CAD.  IMPRESSION:  1. Suspicious calcifications in a segmental distribution in the  upper outer right breast.  2. Expected post lumpectomy changes in the upper right breast.  3. No mammographic evidence of malignancy, left breast.  RECOMMENDATION:  Stereotactic core needle biopsy of the suspicious calcifications is  recommended.  I have discussed the findings and recommendations with the patient  and I showed her the mammographic images during this discussion.  All of her questions were answered regarding the stereotactic  biopsy. This has been scheduled for Tuesday, September 9, at 8  o'clock a.m.  Results were also provided in writing at the conclusion of the  visit. If applicable, a reminder  letter will be sent to the  patient regarding her next appointment.  BI-RADS CATEGORY 4: Suspicious abnormality - biopsy should be  considered.  Original Report Authenticated By: Hulan Saas, M.D.     *RADIOLOGY REPORT*  Clinical Data:History of prior right malignant lumpectomy July 2013  for grade II invasive ductal carcinoma and grade 1 to II DCIS.  Subsequent radiation. Recent malignant stereotactic core biopsy of  microcalcifications at the 12 o'clock position of the right breast  demonstrating DCIS 03/03/13.  No labs.  BILATERAL BREAST MRI WITH AND WITHOUT CONTRAST  Technique: Multiplanar, multisequence MR images of both breasts  were obtained prior to and following the intravenous administration  of 14ml of multihance.  THREE-DIMENSIONAL MR IMAGE RENDERING ON INDEPENDENT WORKSTATION:  Three-dimensional MR images were rendered by post-processing of  the original MR data on an independent DynaCad workstation. The  three-dimensional MR images were interpreted, and  findings are  reported in the following complete MRI report for this study.  Comparison: Recent mammograms.  FINDINGS:  Breast composition: b: There are scattered areas of  fibroglandular density.  Background parenchymal enhancement: Mild  Right breast: Post surgical clip artifact is seen over the  posterior third of the upper central right breast. Minimal  adjacent scarring along the superficial aspect of the right  pectoralis muscle. There is a 1 x 1.2 cm hematoma over the  patient's recent biopsy site at the 12 o'clock position of the  right breast in the anterior third approximately 2 cm from the  nipple. Clip artifact is seen along the posterior-superior aspect  of the hematoma. There is no evidence of focal mass or abnormal  enhancement.  Left breast: No mass or abnormal enhancement.  Lymph nodes: No abnormal appearing lymph nodes.  Ancillary findings: None.  IMPRESSION:  Post biopsy clip artifact with 1.2 cm hematoma over the recent  biopsy proven DCIS at the 12 o'clock position of the right breast 2  cm from the nipple. Post surgical changes over the deep third of  the upper central right breast. No suspicious masses or  enhancement in either breast. No adenopathy.  RECOMMENDATION:  Recommend continued followup per clinical treatment plan.     ASSESSMENT: 57 year old female with  #1 T T2 N0 invasive ductal carcinoma (stage II) of the right breast status post right lumpectomy with right axillary lymph node dissection. Patient was found to have a 2.0 cm ER positive PR positive HER-2/neu negative breast cancer 15 lymph nodes were negative for metastatic disease. She is now status post adjuvant radiation therapy and then placed on adjuvant antiestrogen therapy with letrozole.   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 She is now status post mastectomy. Final pathology did reveal only DCIS that was ER positive.  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 We discussed referring her to the lymphedema clinic   #4 anxiety: Patient was to begin Effexor but she has not started it yet. I have explained the rationale for it. I have also explained the need to continue taking Effexor on a daily basis. I also explained to her the need to not stop it and all of a suddenly because of side effects that she may experience.  PLAN:  #1 patient will continue letrozole 2.5 mg daily  #2 patient will not take Effexor since she is not committing to taking it every day.  #  3 patient is lying to [place]. We discussed risks of lymphedema and the need to continue wearing her sleeve and learning how to wrap her right arm while she is following.  #4 patient will be seen back in 6 months time in followup. All questions were answered. The patient knows to call the clinic with any problems, questions or concerns. We can certainly see the patient much sooner if necessary.  I spent 20 minutes counseling  the patient face to face and coordination of careThe total time spent in the appointment was 30 minutes.    Drue Second, MD Medical/Oncology [facility] [phone] (beeper) [phone] (Office)

## 2013-04-30 ENCOUNTER — Other Ambulatory Visit: Payer: Self-pay

## 2013-05-01 ENCOUNTER — Other Ambulatory Visit (INDEPENDENT_AMBULATORY_CARE_PROVIDER_SITE_OTHER): Payer: Self-pay | Admitting: *Deleted

## 2013-05-01 MED ORDER — UNABLE TO FIND: Status: DC

## 2013-05-05 ENCOUNTER — Encounter: Payer: Self-pay | Admitting: *Deleted

## 2013-05-05 ENCOUNTER — Ambulatory Visit: Payer: Medicaid Other | Admitting: Physical Therapy

## 2013-05-05 NOTE — Progress Notes (Signed)
Mailed after appt letter to pt. 

## 2013-05-06 ENCOUNTER — Encounter (INDEPENDENT_AMBULATORY_CARE_PROVIDER_SITE_OTHER): Payer: Self-pay | Admitting: General Surgery

## 2013-05-06 ENCOUNTER — Ambulatory Visit (INDEPENDENT_AMBULATORY_CARE_PROVIDER_SITE_OTHER): Payer: Medicaid Other | Admitting: General Surgery

## 2013-05-06 ENCOUNTER — Encounter (INDEPENDENT_AMBULATORY_CARE_PROVIDER_SITE_OTHER): Payer: Medicaid Other | Admitting: General Surgery

## 2013-05-06 VITALS — BP 130/82 | HR 84 | Temp 97.8°F | Resp 14 | Ht 64.0 in | Wt 150.0 lb

## 2013-05-06 DIAGNOSIS — C50419 Malignant neoplasm of upper-outer quadrant of unspecified female breast: Secondary | ICD-10-CM

## 2013-05-06 DIAGNOSIS — C50411 Malignant neoplasm of upper-outer quadrant of right female breast: Secondary | ICD-10-CM

## 2013-05-07 NOTE — Progress Notes (Signed)
Subjective:     Patient ID: Gina Johns, female   DOB: 26-Jul-1955, 57 y.o.   MRN: [id number]  HPI The patient is a 57 year old [place] female who is 2 months status post right mastectomy for recurrent DCIS. Her postoperative course has been complicated by a small seroma that occurred after her last drain was removed. She denies any chest wall pain. She only notes some slight swelling in her right arm which was present prior to surgery.  Review of Systems     Objective:   Physical Exam On exam her right mastectomy incision has healed nicely with no sign of infection. There is no more palpable seroma laterally at the mastectomy site.    Assessment:     The patient is 2 months status post right mastectomy for DCIS     Plan:     At this point I would like to see her back in one week to check her one more time for any residual fluid prior to her going to [place] since she will be gone for several months potentially. She may also participate with physical therapy.

## 2013-05-08 ENCOUNTER — Encounter (INDEPENDENT_AMBULATORY_CARE_PROVIDER_SITE_OTHER): Payer: Medicaid Other | Admitting: General Surgery

## 2013-05-13 ENCOUNTER — Ambulatory Visit: Payer: Medicaid Other | Admitting: Physical Therapy

## 2013-05-13 ENCOUNTER — Encounter (INDEPENDENT_AMBULATORY_CARE_PROVIDER_SITE_OTHER): Payer: Medicaid Other | Admitting: General Surgery

## 2013-05-14 ENCOUNTER — Encounter (INDEPENDENT_AMBULATORY_CARE_PROVIDER_SITE_OTHER): Payer: Self-pay | Admitting: General Surgery

## 2013-05-14 ENCOUNTER — Ambulatory Visit (INDEPENDENT_AMBULATORY_CARE_PROVIDER_SITE_OTHER): Payer: Medicaid Other | Admitting: General Surgery

## 2013-05-14 VITALS — BP 112/76 | HR 80 | Temp 97.3°F | Resp 16 | Ht 64.0 in | Wt 152.4 lb

## 2013-05-14 DIAGNOSIS — C50911 Malignant neoplasm of unspecified site of right female breast: Secondary | ICD-10-CM

## 2013-05-14 DIAGNOSIS — C50919 Malignant neoplasm of unspecified site of unspecified female breast: Secondary | ICD-10-CM

## 2013-05-14 NOTE — Patient Instructions (Signed)
Wear compression sleeve while traveling May condition skin with moisturizer

## 2013-05-15 ENCOUNTER — Telehealth (INDEPENDENT_AMBULATORY_CARE_PROVIDER_SITE_OTHER): Payer: Self-pay

## 2013-05-15 NOTE — Telephone Encounter (Signed)
Letter up front for pick up  Pt aware 

## 2013-05-15 NOTE — Progress Notes (Signed)
Subjective:     Patient ID: Gina Johns, female   DOB: [birth year], 57 y.o.   MRN: [id number]  HPI The patient is a 57 year old female who is just over 2 months status post right mastectomy for recurrent DCIS. Her postoperative course was complicated by a residual seroma after her last drain was removed. This had to be aspirated a couple of times but it does not seem to be reaccumulating. She complains of only some minor soreness on her right chest wall. Otherwise she seems to be doing well.  Review of Systems     Objective:   Physical Exam On exam her right mastectomy incision is healing nicely with no sign of infection. I do not palpate any residual seroma at this point.    Assessment:     The patient is 2 months status post right mastectomy for recurrent DCIS     Plan:     At this point I think she can participate with physical therapy. She is getting ready to fly to [place] for several months so she should wear her compression sleep on her right arm as much as possible. If she develops any recurrent seroma which I do not think she will she does have a Careers adviser in [place] who can aspirate this. I will plan to see her back when she gets back from [place] in about 3 months

## 2013-05-15 NOTE — Telephone Encounter (Signed)
The pt called asking if there is a cream that was prescribed and if her letter is ready.  I spoke to [place].  She states there is no cream that she needs to take she can just use cocoa butter.  The letter will be ready late this afternoon.  The pt asked if the cocoa butter was called in.  I told her she can get it over the counter.  She would like to pick up the letter around 1:30 on her break.  I told her it may be later in the afternoon.  I will let Marcelino Duster know to see if it can be done earlier if possible.  She can call [phone] when ready.

## 2013-05-18 ENCOUNTER — Ambulatory Visit: Payer: Self-pay | Admitting: Oncology

## 2013-05-18 ENCOUNTER — Ambulatory Visit: Payer: Self-pay | Admitting: Family

## 2013-05-20 ENCOUNTER — Telehealth: Payer: Self-pay | Admitting: *Deleted

## 2013-05-20 NOTE — Telephone Encounter (Signed)
Mailing a letter/avs to inform the pt that her appt is now 10/14/13 w/labs@1pm  and ov@ 1:30pm...td

## 2013-05-20 NOTE — Telephone Encounter (Signed)
i will mail a letter/avs informing the pt that on 10/14/13 kk will be in [facility] during the am. gv appt for 10/14/13 w/ labs@ 1pm and ov@ 1:30pm...td

## 2013-06-15 ENCOUNTER — Other Ambulatory Visit: Payer: No Typology Code available for payment source | Admitting: Lab

## 2013-06-15 ENCOUNTER — Ambulatory Visit: Payer: No Typology Code available for payment source | Admitting: Oncology

## 2013-09-25 ENCOUNTER — Telehealth: Payer: Self-pay

## 2013-09-25 NOTE — Telephone Encounter (Signed)
Per her msg request, let pt know her 6 month follow up is 10/14/13 at 1 pm.  Patient wanted to reschedule to next week, or earlier in morning/later in the afternoon on the 22nd.  Advised patient that [place] is booked solid through July and if she cancelled it would be difficult to find availability for her.  Pt voiced understanding and asked to be contacted in event of a cancellation.

## 2013-09-29 ENCOUNTER — Telehealth: Payer: Self-pay

## 2013-09-29 NOTE — Telephone Encounter (Signed)
Pt is requesting reschedule to see KK earlier due to shoulder and neck pain - wants PT.  I let patient know KK does not have any availability until July.  Advised pt if pain is too bad to wait until 4/22 she should go to emergency room.  Went through every day in April with her on phone looking for an open slot - none available.  Let pt know I strongly advised she keep the appt 4/22 as we can not get her back on the schedule until July if she cancels 4/22.  Pt voiced understanding.

## 2013-10-13 ENCOUNTER — Telehealth: Payer: Self-pay | Admitting: Oncology

## 2013-10-13 NOTE — Telephone Encounter (Signed)
pt returned call and confirmed new appt for 5/27 lb/dr moore and is aware 4/22 has been cxd.

## 2013-10-13 NOTE — Telephone Encounter (Signed)
kk out - moved 4/22 appt to 5/27 lb/dr moore @ 1:30pm.  lmonvm informing pt and mailed schedule.

## 2013-10-14 ENCOUNTER — Other Ambulatory Visit: Payer: Self-pay | Admitting: Lab

## 2013-10-14 ENCOUNTER — Ambulatory Visit: Payer: Self-pay | Admitting: Oncology

## 2013-10-14 ENCOUNTER — Other Ambulatory Visit: Payer: Self-pay

## 2013-10-19 ENCOUNTER — Encounter (INDEPENDENT_AMBULATORY_CARE_PROVIDER_SITE_OTHER): Payer: Self-pay | Admitting: General Surgery

## 2013-10-19 ENCOUNTER — Ambulatory Visit (INDEPENDENT_AMBULATORY_CARE_PROVIDER_SITE_OTHER): Payer: Medicaid Other | Admitting: General Surgery

## 2013-10-19 ENCOUNTER — Other Ambulatory Visit (INDEPENDENT_AMBULATORY_CARE_PROVIDER_SITE_OTHER): Payer: Self-pay | Admitting: *Deleted

## 2013-10-19 ENCOUNTER — Telehealth: Payer: Self-pay | Admitting: Hematology and Oncology

## 2013-10-19 VITALS — BP 134/72 | HR 70 | Temp 98.4°F | Ht 64.0 in | Wt 160.0 lb

## 2013-10-19 DIAGNOSIS — C50919 Malignant neoplasm of unspecified site of unspecified female breast: Secondary | ICD-10-CM

## 2013-10-19 DIAGNOSIS — C50911 Malignant neoplasm of unspecified site of right female breast: Secondary | ICD-10-CM

## 2013-10-19 DIAGNOSIS — I890 Lymphedema, not elsewhere classified: Secondary | ICD-10-CM

## 2013-10-19 NOTE — Telephone Encounter (Signed)
, °

## 2013-10-19 NOTE — Progress Notes (Signed)
Subjective:     Patient ID: Gina Johns, female   DOB: 1956-03-01, 58 y.o.   MRN: [id number]  HPI The patient is a 58 year old [place] female who is 6 months status post right mastectomy for recurrent DCIS. Her postoperative course was complicated by a residual seroma which has resolved. She does complain of some mild swelling of her right arm as well.  Review of Systems  Constitutional: Negative.   HENT: Negative.   Eyes: Negative.   Respiratory: Negative.   Cardiovascular: Negative.   Gastrointestinal: Negative.   Endocrine: Negative.   Genitourinary: Negative.   Musculoskeletal: Negative.   Skin: Negative.   Allergic/Immunologic: Negative.   Neurological: Negative.   Hematological: Negative.   Psychiatric/Behavioral: Negative.        Objective:   Physical Exam  Constitutional: She is oriented to person, place, and time. She appears well-developed and well-nourished.  HENT:  Head: Normocephalic and atraumatic.  Eyes: Conjunctivae and EOM are normal. Pupils are equal, round, and reactive to light.  Neck: Normal range of motion. Neck supple.  Cardiovascular: Normal rate, regular rhythm and normal heart sounds.   Pulmonary/Chest: Effort normal and breath sounds normal.  There is no palpable mass of the right chest wall. There is no palpable mass of the left breast. There is no palpable axillary, supraclavicular, or cervical lymphadenopathy.  Abdominal: Soft. Bowel sounds are normal.  Musculoskeletal: Normal range of motion.  There is some mild swelling of her right arm mostly between the elbow and axilla on the underside  Lymphadenopathy:    She has no cervical adenopathy.  Neurological: She is alert and oriented to person, place, and time.  Skin: Skin is warm and dry.  Psychiatric: She has a normal mood and affect. Her behavior is normal.       Assessment:     The patient is 6 months status post right mastectomy for recurrent DCIS     Plan:     At this point she  will continue to take letrozole. She will continue to do regular self exams. I will plan to see her back in about 3 months.

## 2013-10-30 ENCOUNTER — Ambulatory Visit (HOSPITAL_BASED_OUTPATIENT_CLINIC_OR_DEPARTMENT_OTHER): Payer: Medicaid Other | Admitting: Hematology and Oncology

## 2013-10-30 ENCOUNTER — Other Ambulatory Visit (HOSPITAL_BASED_OUTPATIENT_CLINIC_OR_DEPARTMENT_OTHER): Payer: Medicaid Other

## 2013-10-30 ENCOUNTER — Telehealth: Payer: Self-pay

## 2013-10-30 ENCOUNTER — Other Ambulatory Visit: Payer: Self-pay | Admitting: *Deleted

## 2013-10-30 VITALS — BP 126/80 | HR 72 | Temp 98.2°F | Resp 20 | Ht 64.0 in | Wt 160.5 lb

## 2013-10-30 DIAGNOSIS — Z170 Estrogen receptor positive status [ER+]: Secondary | ICD-10-CM

## 2013-10-30 DIAGNOSIS — E785 Hyperlipidemia, unspecified: Secondary | ICD-10-CM

## 2013-10-30 DIAGNOSIS — C50419 Malignant neoplasm of upper-outer quadrant of unspecified female breast: Secondary | ICD-10-CM

## 2013-10-30 DIAGNOSIS — C50411 Malignant neoplasm of upper-outer quadrant of right female breast: Secondary | ICD-10-CM

## 2013-10-30 DIAGNOSIS — F411 Generalized anxiety disorder: Secondary | ICD-10-CM

## 2013-10-30 DIAGNOSIS — C50911 Malignant neoplasm of unspecified site of right female breast: Secondary | ICD-10-CM

## 2013-10-30 DIAGNOSIS — I890 Lymphedema, not elsewhere classified: Secondary | ICD-10-CM

## 2013-10-30 DIAGNOSIS — N951 Menopausal and female climacteric states: Secondary | ICD-10-CM

## 2013-10-30 LAB — LIPID PANEL
CHOLESTEROL: 264 mg/dL — AB (ref 0–200)
HDL: 36 mg/dL — ABNORMAL LOW (ref >39)
Total CHOL/HDL Ratio: 7.3 Ratio
Triglycerides: 436 mg/dL — ABNORMAL HIGH (ref <150)

## 2013-10-30 LAB — CBC WITH DIFFERENTIAL/PLATELET
BASO%: 0.4 % (ref 0.0–2.0)
Basophils Absolute: 0 10*3/uL (ref 0.0–0.1)
EOS%: 5.5 % (ref 0.0–7.0)
Eosinophils Absolute: 0.4 10*3/uL (ref 0.0–0.5)
HCT: 40.4 % (ref 34.8–46.6)
HEMOGLOBIN: 13.2 g/dL (ref 11.6–15.9)
LYMPH#: 2 10*3/uL (ref 0.9–3.3)
LYMPH%: 30 % (ref 14.0–49.7)
MCH: 25.5 pg (ref 25.1–34.0)
MCHC: 32.5 g/dL (ref 31.5–36.0)
MCV: 78.4 fL — AB (ref 79.5–101.0)
MONO#: 0.5 10*3/uL (ref 0.1–0.9)
MONO%: 7 % (ref 0.0–14.0)
NEUT%: 57.1 % (ref 38.4–76.8)
NEUTROS ABS: 3.7 10*3/uL (ref 1.5–6.5)
Platelets: 261 10*3/uL (ref 145–400)
RBC: 5.15 10*6/uL (ref 3.70–5.45)
RDW: 13.8 % (ref 11.2–14.5)
WBC: 6.5 10*3/uL (ref 3.9–10.3)

## 2013-10-30 LAB — COMPREHENSIVE METABOLIC PANEL (CC13)
ALBUMIN: 3.9 g/dL (ref 3.5–5.0)
ALK PHOS: 73 U/L (ref 40–150)
ALT: 14 U/L (ref 0–55)
AST: 15 U/L (ref 5–34)
Anion Gap: 9 mEq/L (ref 3–11)
BUN: 17.4 mg/dL (ref 7.0–26.0)
CALCIUM: 9.6 mg/dL (ref 8.4–10.4)
CO2: 22 mEq/L (ref 22–29)
Chloride: 108 mEq/L (ref 98–109)
Creatinine: 0.8 mg/dL (ref 0.6–1.1)
GLUCOSE: 94 mg/dL (ref 70–140)
POTASSIUM: 4.3 meq/L (ref 3.5–5.1)
SODIUM: 139 meq/L (ref 136–145)
TOTAL PROTEIN: 7.4 g/dL (ref 6.4–8.3)
Total Bilirubin: 0.31 mg/dL (ref 0.20–1.20)

## 2013-10-30 MED ORDER — LETROZOLE 2.5 MG PO TABS: 2.5000 mg | ORAL_TABLET | Freq: Every day | ORAL | Status: DC

## 2013-10-30 NOTE — Telephone Encounter (Signed)
Call in from pt forwarded by AM.  Unable to understand most of message d/t pt accent.   2nd msg from patient - able to identify from phone number and DOB.  LMOVM - request pt to return call.

## 2013-10-30 NOTE — Progress Notes (Signed)
OFFICE PROGRESS NOTE  CC  No PCP Per Patient No address on file  Chief complaint: Followup visit for breast cancer  DIAGNOSIS: 59 year old female with invasive ductal carcinoma of the right breast diagnosed in June 2013 in [place].  STAGE:  Clinical stage II (T2 N0)   PRIOR THERAPY: As per previously documented Dr.Khan's note:  #1Patient had her first mammogram one year ago which was normal. Patient travel to [place] over the summer and noted a mass in the right upper quadrant of her right breast. 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 On mammogram the mass measured about 2 x 2 by 1.5 cm.   #2 Patient went on to have a lump the mean with axillary lymph node dissection on 11/30/2011. The final pathology revealed a 2.0 cm invasive ductal carcinoma that was grade 2:15 axillary lymph nodes were negative for metastatic disease. Tumor was ER positive PR positive HER-2/neu negative Ki-67 10%. Postoperatively patient did receive adjuvant radiation therapy.   #3She was then begun on letrozole 2.5 mg daily she has been on this now for about 3 months  #4 patient is now status post right mastectomy. Postoperatively she is doing well. 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  CURRENT THERAPY:letrozole 2.5 mg daily  INTERVAL HISTORY:  Rivka Spring 58 y.o. female returns for followup visit today. She was started on letrozole in August 2013. She complains of bilateral knee pains, intermittent mood changes and also hot flashes. She takes calcium and vitamin D. supplementation daily. She does complain of lymphedema in the right upper extremity and she  does exercises for that. He says she's not able to do any exercise to lose weight in view of her knee pains. She will be  following up  with her orthopedics for further workup    She denies any headaches ,double vision, blurring of vision. Patient does  complain of intermittent  anxiety.   MEDICAL HISTORY: Past Medical History  Diagnosis Date  . Breast cancer 12/07/2011    right breast 2x3 cm  . Right knee pain 08/26/2012  . Anxiety   . Arthritis     ALLERGIES:  has No Known Allergies.  MEDICATIONS:  Current Outpatient Prescriptions  Medication Sig Dispense Refill  . Calcium Citrate-Vitamin D (CITRACAL + D PO) Take 1 tablet by mouth every other day.      [name] UNABLE TO FIND Rx: L8000-Post Surgical Bras (Quantity: 6) A1937- Non-Silicone Breast Prosthesis (Quantity: 1) Dx: 174.9; Right Mastectomy  1 each  0  . UNABLE TO FIND Rx: T0240- Silicone Breast Prosthesis (Quantity: 1) Dx: 174.9; Right mastectomy  1 each  0  . letrozole (FEMARA) 2.5 MG tablet Take 1 tablet (2.5 mg total) by mouth daily.  180 tablet  6   No current facility-administered medications for this visit.    SURGICAL HISTORY:  Past Surgical History  Procedure Laterality Date  . Breast lumpectomy with axillary lymph node biopsy    . Cesarean section      Hx: of times 2  . Total mastectomy Right 03/20/2013    Procedure: TOTAL MASTECTOMY;  Surgeon: Merrie Roof, MD;  Location: [facility];  Service: General;  Laterality: Right;    REVIEW OF SYSTEMS:  A 10 point review of symptoms were assessed and the pertinent  symptoms are mentioned in interval history    PHYSICAL EXAMINATION: Blood pressure 126/80, pulse 72, temperature 98.2 F (36.8 C), temperature source Oral, resp. rate 20, height _0  (1.626 m), weight 160 lb 8 oz (72.802 kg), last menstrual period 03/04/2013. Body mass index is 27.54 kg/(m^2). ECOG PERFORMANCE STATUS: 0 - Asymptomatic  HEENT PERRLA, sclerae anicteric, conjunctiva no pallor, neck supple no JVD no thyromegaly Lymph node exam: no lymphadenopathy appreciated Respiratory system: clear to auscultation, no rales  CVS :S1-S2 normal  intensity, no murmurs Abdomen: soft normoactive bowel sounds, no hepatosplenomegaly  Extremities:  no cyanosis, no clubbing. Patient does have right upper extremity lymphedema Neuro: Alert oriented x3 no focal deficits Psychological normal mood and affect laboratory data Breast examination: Right breast mastectomy scar noted with no nodules appreciated on the chest wall, left breast no masses felt, no bilateral axillary lymphadenopathy.     LABORATORY DATA: Lab Results  Component Value Date   WBC 6.5 10/30/2013   HGB 13.2 10/30/2013   HCT 40.4 10/30/2013   MCV 78.4* 10/30/2013   PLT 261 10/30/2013      Chemistry      Component Value Date/Time   NA 139 10/30/2013 1525   K 4.3 10/30/2013 1525   CL 107 05/12/2012 1441   CO2 22 10/30/2013 1525   BUN 17.4 10/30/2013 1525   CREATININE 0.8 10/30/2013 1525      Component Value Date/Time   CALCIUM 9.6 10/30/2013 1525   ALKPHOS 73 10/30/2013 1525   AST 15 10/30/2013 1525   ALT 14 10/30/2013 1525   BILITOT 0.31 10/30/2013 1525       RADIOGRAPHIC STUDIES:  Dg Ankle Complete Right  12/31/2012   *RADIOLOGY REPORT*  Clinical Data: [place] 2 days ago.  Lateral ankle pain.  RIGHT ANKLE - COMPLETE 3+ VIEW  Comparison: None.  Findings: Soft tissue swelling lateral malleolar region.  No underlying fracture or dislocation.  Ossific structure near the lateral malleolus is well corticated and felt not to represent result of acute fracture.  Plantar spur.  Bony overgrowth anterior inferior aspect of the tibia probably related to degenerative changes.  Small joint effusion not excluded.  IMPRESSION: Soft tissue swelling lateral malleolar region without underlying fracture or dislocation.  Please see above.   Original Report Authenticated By: Genia Del, M.D  RADIOLOGY REPORT*  Clinical Data: Malignant lumpectomy of the right breast in July,  2013 in [place], [place]. Pathology report from [place] describes  invasive ductal carcinoma (grade 2) with grade 1/2 DCIS. 15  negative  right axillary lymph nodes at the time of surgery.  Subsequent radiation therapy to the right breast. Annual  evaluation.  DIGITAL DIAGNOSTIC BILATERAL MAMMOGRAM WITH CAD  Comparison: The prior mammograms from [place] are not available for  direct comparison. I have paper images of a preoperative  ultrasound dated 12/04/2011 at the time of diagnosis.  Findings:  ACR Breast Density Category b: There are scattered areas of  fibroglandular density.  CC and MLO views of both breast, a spot tangential view of the  upper right breast at the lumpectomy site, and spot magnification  views of calcifications in the upper right breast were obtained.  The spot magnification views confirm amorphous calcifications which  are segmental and in the upper outer quadrant of the breast. As  one approaches the nipple, the calcifications are in a linear  orientation and are pleomorphic. Spot tangential view shows the  expected post lumpectomy changes in the upper right breast.  There are no  findings suspicious for malignancy in the left  breast.  Mammographic images were processed with CAD.  IMPRESSION:  1. Suspicious calcifications in a segmental distribution in the  upper outer right breast.  2. Expected post lumpectomy changes in the upper right breast.  3. No mammographic evidence of malignancy, left breast.  RECOMMENDATION:  Stereotactic core needle biopsy of the suspicious calcifications is  recommended.  I have discussed the findings and recommendations with the patient  and I showed her the mammographic images during this discussion.  All of her questions were answered regarding the stereotactic  biopsy. This has been scheduled for Tuesday, September 9, at 8  o'clock a.m.  Results were also provided in writing at the conclusion of the  visit. If applicable, a reminder letter will be sent to the  patient regarding her next appointment.  BI-RADS CATEGORY 4: Suspicious abnormality - biopsy should  be  considered.  Original Report Authenticated By: Evangeline Dakin, M.D.     *RADIOLOGY REPORT*  Clinical Data:History of prior right malignant lumpectomy July 2013  for grade II invasive ductal carcinoma and grade 1 to II DCIS.  Subsequent radiation. Recent malignant stereotactic core biopsy of  microcalcifications at the 12 o'clock position of the right breast  demonstrating DCIS 03/03/13.  No labs.  BILATERAL BREAST MRI WITH AND WITHOUT CONTRAST  Technique: Multiplanar, multisequence MR images of both breasts  were obtained prior to and following the intravenous administration  of 64m of multihance.  THREE-DIMENSIONAL MR IMAGE RENDERING ON INDEPENDENT WORKSTATION:  Three-dimensional MR images were rendered by post-processing of  the original MR data on an independent DynaCad workstation. The  three-dimensional MR images were interpreted, and findings are  reported in the following complete MRI report for this study.  Comparison: Recent mammograms.  FINDINGS:  Breast composition: b: There are scattered areas of  fibroglandular density.  Background parenchymal enhancement: Mild  Right breast: Post surgical clip artifact is seen over the  posterior third of the upper central right breast. Minimal  adjacent scarring along the superficial aspect of the right  pectoralis muscle. There is a 1 x 1.2 cm hematoma over the  patient's recent biopsy site at the 12 o'clock position of the  right breast in the anterior third approximately 2 cm from the  nipple. Clip artifact is seen along the posterior-superior aspect  of the hematoma. There is no evidence of focal mass or abnormal  enhancement.  Left breast: No mass or abnormal enhancement.  Lymph nodes: No abnormal appearing lymph nodes.  Ancillary findings: None.  IMPRESSION:  Post biopsy clip artifact with 1.2 cm hematoma over the recent  biopsy proven DCIS at the 12 o'clock position of the right breast 2  cm from the nipple. Post  surgical changes over the deep third of  the upper central right breast. No suspicious masses or  enhancement in either breast. No adenopathy.  RECOMMENDATION:  Recommend continued followup per clinical treatment plan.     ASSESSMENT: 58year old female with  #1 T T2 N0 invasive ductal carcinoma (stage II) of the right breast status post right lumpectomy with right axillary lymph node dissection. Patient was found to have a 2.0 cm ER positive PR positive HER-2/neu negative breast cancer 15 lymph nodes were negative for metastatic disease. She is now status post adjuvant radiation therapy and then placed on adjuvant antiestrogen therapy with letrozole.   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  She is now status post mastectomy. Final pathology did reveal only DCIS that was ER positive. Postoperatively she is doing well  #3 letrozole started in August 2013. Continue letrozole for total of 5 years    #4 right upper extremity lymphedema due to lymph node dissection:  she was evaluated by the lymphedema clinic and was given lymphedema sleeve and she continue to do lymphedema exercises as recommended by physical therapy  #5 anxiety and hot flashes: Patient was given  Effexor prescription during the last visit but she has not started taking because of the concern about the side effects.    #6 Bone health: We'll arrange for her bone densitometry. Continue calcium with vitamin D. Supplementation  Next followup visit end of August, after  left breast mammogram to be scheduled on  February 18, 2014  All questions were answered. The patient knows to call the clinic with any problems, questions or concerns. We can certainly see the patient much sooner if necessary.  I spent 20 minutes counseling  the patient face to face and coordination of care. The total time spent in the appointment was 30 minutes.    Wilmon Arms,  MD Medical/Oncology [place] [phone] (Office)

## 2013-11-02 ENCOUNTER — Telehealth: Payer: Self-pay

## 2013-11-02 ENCOUNTER — Telehealth: Payer: Self-pay | Admitting: *Deleted

## 2013-11-02 NOTE — Telephone Encounter (Signed)
Pt called & requested copy of lab results.  Dr Earnest Conroy had req that we send copy of labs to her PCP.  She reports that she does not have a PCP & would like to have a copy of labs mailed to her.  Informed that cholesterol & triglycerides were up & she reports that she doesn't want to go on any meds & would like to try diet for now.  Informed Dr Earnest Conroy & she is OK for me to send her a copy lab results. This will be mailed.

## 2013-11-02 NOTE — Telephone Encounter (Signed)
Pt requesting lab results from 5/8 appt.  Saw Kamineni Routed [place], Royce Macadamia

## 2013-11-04 ENCOUNTER — Telehealth: Payer: Self-pay | Admitting: Oncology

## 2013-11-04 NOTE — Telephone Encounter (Signed)
lvm for pt regarding to Aug and SEpt appt....mailed pt appt sched/avs and letter

## 2013-11-09 ENCOUNTER — Telehealth: Payer: Self-pay | Admitting: Oncology

## 2013-11-09 NOTE — Telephone Encounter (Signed)
returned pt call adn r/s per pt request...pt ok adn aware.Gina KitchenMarland Kitchenpt will call to r/s mammo and bone density

## 2013-11-11 ENCOUNTER — Ambulatory Visit: Payer: Medicaid Other | Admitting: Physical Therapy

## 2013-11-18 ENCOUNTER — Ambulatory Visit: Payer: Self-pay

## 2013-11-18 ENCOUNTER — Other Ambulatory Visit: Payer: Self-pay

## 2013-11-23 ENCOUNTER — Ambulatory Visit: Payer: Medicaid Other | Attending: General Surgery | Admitting: Physical Therapy

## 2013-11-23 DIAGNOSIS — IMO0001 Reserved for inherently not codable concepts without codable children: Secondary | ICD-10-CM | POA: Insufficient documentation

## 2013-11-23 DIAGNOSIS — I890 Lymphedema, not elsewhere classified: Secondary | ICD-10-CM | POA: Diagnosis not present

## 2013-11-23 DIAGNOSIS — C50919 Malignant neoplasm of unspecified site of unspecified female breast: Secondary | ICD-10-CM | POA: Insufficient documentation

## 2013-12-07 ENCOUNTER — Ambulatory Visit
Admission: RE | Admit: 2013-12-07 | Discharge: 2013-12-07 | Disposition: A | Discharge status: Home / Self Care | Payer: Medicaid Other | Source: Ambulatory Visit | Attending: Sports Medicine | Admitting: Sports Medicine

## 2013-12-07 ENCOUNTER — Ambulatory Visit (INDEPENDENT_AMBULATORY_CARE_PROVIDER_SITE_OTHER): Payer: Medicaid Other | Admitting: Sports Medicine

## 2013-12-07 ENCOUNTER — Encounter: Payer: Self-pay | Admitting: Sports Medicine

## 2013-12-07 VITALS — BP 133/84 | Ht 64.0 in | Wt 160.0 lb

## 2013-12-07 DIAGNOSIS — M25561 Pain in right knee: Secondary | ICD-10-CM

## 2013-12-07 DIAGNOSIS — M2241 Chondromalacia patellae, right knee: Secondary | ICD-10-CM | POA: Insufficient documentation

## 2013-12-07 DIAGNOSIS — M25569 Pain in unspecified knee: Secondary | ICD-10-CM

## 2013-12-07 DIAGNOSIS — M2240 Chondromalacia patellae, unspecified knee: Secondary | ICD-10-CM

## 2013-12-07 NOTE — Progress Notes (Signed)
   Subjective:    Patient ID: Gina Johns, female    DOB: May 22, 1956, 58 y.o.   MRN: [id number]  HPI chief complaint: Right knee pain  Very pleasant 58 year old female comes in today complaining of returning right knee pain. She was last seen in the office back in July of last year. An MRI of her right knee done in [place] in December of 2013 showed some degenerative changes in the medial meniscus without any evidence of a discrete tear. 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 Articular cartilage appeared to be well-preserved. I injected her knee with cortisone last July and she had good symptom relief up until about 3 months ago. She denies any trauma. Instead, she describes a gradual onset of aching pain along the medial aspect of her knee. Pain is intermittent but present mainly with activity. She denies any swelling. No locking or catching. No radiating pain. No numbness or tingling.  Interim medical history is reviewed. Medications reviewed. No known drug allergies    Review of Systems    as above Objective:   Physical Exam Well-developed, well-nourished. No acute distress. Awake alert and oriented x3. Vital signs reviewed  Right knee: Full range of motion. No effusion. 2-3+ patellofemoral crepitus. Slight tenderness to palpation along the medial joint line but a negative McMurray's. Negative Thessaly's. Knee is grossly stable to ligamentous exam. No popliteal cyst. Neurovascularly intact distally. Walking without a limp.       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 Patient will followup with me later this week to discuss those findings. I discussed the possibility of a repeat cortisone injection coupled with a home exercise program depending on those results.

## 2013-12-10 ENCOUNTER — Ambulatory Visit (INDEPENDENT_AMBULATORY_CARE_PROVIDER_SITE_OTHER): Payer: Medicaid Other | Admitting: Sports Medicine

## 2013-12-10 ENCOUNTER — Encounter: Payer: Self-pay | Admitting: Sports Medicine

## 2013-12-10 VITALS — BP 129/84 | Ht 64.0 in | Wt 160.0 lb

## 2013-12-10 DIAGNOSIS — M1710 Unilateral primary osteoarthritis, unspecified knee: Secondary | ICD-10-CM

## 2013-12-10 DIAGNOSIS — M1711 Unilateral primary osteoarthritis, right knee: Secondary | ICD-10-CM

## 2013-12-10 NOTE — Patient Instructions (Signed)
  Get some glucosamine/chondrotin sulfate and take 1500mg  a day  Take between 1500-2000 milligrams of calcium daily and 800 international units of vitamin D  Followup with me in 6 weeks

## 2013-12-11 DIAGNOSIS — M1711 Unilateral primary osteoarthritis, right knee: Secondary | ICD-10-CM | POA: Insufficient documentation

## 2013-12-11 NOTE — Progress Notes (Signed)
Patient ID: Gina Johns, female   DOB: 1956/03/06, 58 y.o.   MRN: [id number]  Patient comes in today with her husband to discuss x-ray findings of her right knee. X-rays show some mild to moderate medial compartmental narrowing as well as some patellofemoral spurring. Findings are consistent with mild to moderate osteoarthritis. We discussed treatment options including glucosamine/chondrotin sulfate, physical therapy, and repeat cortisone injections. She has decided to try the glucosamine and we will couple this with a home exercise program consisting of quad and hamstring strengthening. She will take 1500 mg of glucosamine daily. Patient will followup with me in 6 weeks. If no real benefit is noted with using glucosamine, I would consider a repeat cortisone injection followup. We also discussed calcium and vitamin D. She will take between 1500-2000mg  of calcium and 800 international units of vitamin D daily.

## 2013-12-31 ENCOUNTER — Ambulatory Visit: Payer: Medicaid Other | Attending: Internal Medicine

## 2014-01-05 ENCOUNTER — Ambulatory Visit (INDEPENDENT_AMBULATORY_CARE_PROVIDER_SITE_OTHER): Payer: Medicaid Other | Admitting: General Surgery

## 2014-01-18 ENCOUNTER — Ambulatory Visit
Admission: RE | Admit: 2014-01-18 | Discharge: 2014-01-18 | Disposition: A | Discharge status: Home / Self Care | Payer: Medicaid Other | Source: Ambulatory Visit | Attending: Sports Medicine | Admitting: Sports Medicine

## 2014-01-18 ENCOUNTER — Encounter: Payer: Self-pay | Admitting: Sports Medicine

## 2014-01-18 ENCOUNTER — Ambulatory Visit (INDEPENDENT_AMBULATORY_CARE_PROVIDER_SITE_OTHER): Payer: Medicaid Other | Admitting: Sports Medicine

## 2014-01-18 VITALS — BP 145/83 | HR 69 | Ht 64.0 in | Wt 160.0 lb

## 2014-01-18 DIAGNOSIS — M79671 Pain in right foot: Secondary | ICD-10-CM

## 2014-01-18 DIAGNOSIS — M79672 Pain in left foot: Principal | ICD-10-CM

## 2014-01-18 DIAGNOSIS — M79609 Pain in unspecified limb: Secondary | ICD-10-CM

## 2014-01-18 NOTE — Progress Notes (Addendum)
   Subjective:    Patient ID: Gina Johns, female    DOB: 1956-01-16, 58 y.o.   MRN: [id number]  HPI Patient comes in today for followup on right knee pain due to osteoarthritis. She is doing better. Pain is a 3/10. She feels like glucosamine and chondroitin are helping. She admits however that she has not been doing her home exercises. She is complaining of some pain and swelling in the dorsum of each foot, left greater then right. No trauma. No numbness or tingling. She is here today with her husband.    Review of Systems     Objective:   Physical Exam Well-developed, well-nourished. No acute distress  Right knee: Full range of motion. 2+ patellofemoral crepitus. Trace effusion. No joint line tenderness. Good joint stability.  Examination of each foot shows prominence of the midfoot likely secondary to DJD. No significant soft tissue swelling. No pain with metatarsal squeeze. Neurovascularly intact distally.       Assessment & Plan:  Improved right knee pain secondary to DJD Bilateral foot pain, left greater than right, likely secondary to midfoot DJD  X-rays of each foot to include AP and lateral views. Will try some green sports insoles for cushioning. We will also try some arch strap for support. For her knee osteoarthritis I have encouraged her to continue with her glucosamine and I have reiterated the importance of her home exercises. I will call her after I review the x-rays of her feet.  Addendum: X-rays of each foot are reviewed. Very mild degenerative changes in the mid foot. Nothing acute.

## 2014-01-25 ENCOUNTER — Telehealth: Payer: Self-pay | Admitting: Adult Health

## 2014-01-26 ENCOUNTER — Ambulatory Visit: Payer: Self-pay | Admitting: Sports Medicine

## 2014-01-26 ENCOUNTER — Ambulatory Visit (INDEPENDENT_AMBULATORY_CARE_PROVIDER_SITE_OTHER): Payer: Medicaid Other | Admitting: General Surgery

## 2014-02-15 ENCOUNTER — Ambulatory Visit: Payer: Self-pay | Admitting: Adult Health

## 2014-02-15 ENCOUNTER — Other Ambulatory Visit: Payer: Self-pay

## 2014-02-17 ENCOUNTER — Ambulatory Visit: Payer: Self-pay

## 2014-02-17 ENCOUNTER — Other Ambulatory Visit: Payer: Self-pay

## 2014-02-18 ENCOUNTER — Ambulatory Visit
Admission: RE | Admit: 2014-02-18 | Discharge: 2014-02-18 | Disposition: A | Discharge status: Home / Self Care | Payer: Medicaid Other | Source: Ambulatory Visit | Attending: Hematology and Oncology | Admitting: Hematology and Oncology

## 2014-02-18 ENCOUNTER — Other Ambulatory Visit: Payer: Self-pay | Admitting: Hematology and Oncology

## 2014-02-18 ENCOUNTER — Telehealth: Payer: Self-pay | Admitting: *Deleted

## 2014-02-18 DIAGNOSIS — C50911 Malignant neoplasm of unspecified site of right female breast: Secondary | ICD-10-CM

## 2014-02-18 NOTE — Telephone Encounter (Signed)
Left VM requesting to move her 02/23/14 appointment to 02/22/14 "anytime in the morning". Message forwarded to [facility] Scheduling department.

## 2014-02-19 ENCOUNTER — Ambulatory Visit: Payer: Self-pay

## 2014-02-19 ENCOUNTER — Other Ambulatory Visit: Payer: Self-pay

## 2014-02-22 ENCOUNTER — Ambulatory Visit (HOSPITAL_BASED_OUTPATIENT_CLINIC_OR_DEPARTMENT_OTHER): Payer: Medicaid Other | Admitting: Hematology and Oncology

## 2014-02-22 ENCOUNTER — Telehealth: Payer: Self-pay | Admitting: Hematology and Oncology

## 2014-02-22 ENCOUNTER — Encounter (INDEPENDENT_AMBULATORY_CARE_PROVIDER_SITE_OTHER): Payer: Self-pay | Admitting: General Surgery

## 2014-02-22 ENCOUNTER — Encounter (INDEPENDENT_AMBULATORY_CARE_PROVIDER_SITE_OTHER): Payer: Self-pay

## 2014-02-22 ENCOUNTER — Ambulatory Visit (INDEPENDENT_AMBULATORY_CARE_PROVIDER_SITE_OTHER): Payer: Medicaid Other | Admitting: General Surgery

## 2014-02-22 ENCOUNTER — Ambulatory Visit (HOSPITAL_BASED_OUTPATIENT_CLINIC_OR_DEPARTMENT_OTHER): Payer: Medicaid Other

## 2014-02-22 ENCOUNTER — Telehealth: Payer: Self-pay | Admitting: Adult Health

## 2014-02-22 ENCOUNTER — Encounter: Payer: Self-pay | Admitting: Hematology and Oncology

## 2014-02-22 VITALS — BP 143/82 | HR 67 | Temp 98.2°F | Resp 18 | Ht 64.0 in | Wt 159.4 lb

## 2014-02-22 VITALS — BP 112/68 | HR 76 | Resp 14 | Ht 64.0 in | Wt 159.6 lb

## 2014-02-22 DIAGNOSIS — C50419 Malignant neoplasm of upper-outer quadrant of unspecified female breast: Secondary | ICD-10-CM

## 2014-02-22 DIAGNOSIS — C50911 Malignant neoplasm of unspecified site of right female breast: Secondary | ICD-10-CM

## 2014-02-22 DIAGNOSIS — C50411 Malignant neoplasm of upper-outer quadrant of right female breast: Secondary | ICD-10-CM

## 2014-02-22 DIAGNOSIS — M949 Disorder of cartilage, unspecified: Secondary | ICD-10-CM

## 2014-02-22 DIAGNOSIS — Z170 Estrogen receptor positive status [ER+]: Secondary | ICD-10-CM

## 2014-02-22 DIAGNOSIS — M8580 Other specified disorders of bone density and structure, unspecified site: Secondary | ICD-10-CM | POA: Insufficient documentation

## 2014-02-22 DIAGNOSIS — I890 Lymphedema, not elsewhere classified: Secondary | ICD-10-CM

## 2014-02-22 DIAGNOSIS — M899 Disorder of bone, unspecified: Secondary | ICD-10-CM

## 2014-02-22 LAB — CBC WITH DIFFERENTIAL/PLATELET
BASO%: 0.3 % (ref 0.0–2.0)
Basophils Absolute: 0 10*3/uL (ref 0.0–0.1)
EOS%: 6.2 % (ref 0.0–7.0)
Eosinophils Absolute: 0.4 10*3/uL (ref 0.0–0.5)
HEMATOCRIT: 40.7 % (ref 34.8–46.6)
HGB: 13.2 g/dL (ref 11.6–15.9)
LYMPH#: 2.1 10*3/uL (ref 0.9–3.3)
LYMPH%: 34.8 % (ref 14.0–49.7)
MCH: 25.4 pg (ref 25.1–34.0)
MCHC: 32.4 g/dL (ref 31.5–36.0)
MCV: 78.4 fL — ABNORMAL LOW (ref 79.5–101.0)
MONO#: 0.4 10*3/uL (ref 0.1–0.9)
MONO%: 6.7 % (ref 0.0–14.0)
NEUT#: 3.1 10*3/uL (ref 1.5–6.5)
NEUT%: 52 % (ref 38.4–76.8)
Platelets: 243 10*3/uL (ref 145–400)
RBC: 5.19 10*6/uL (ref 3.70–5.45)
RDW: 13.3 % (ref 11.2–14.5)
WBC: 6 10*3/uL (ref 3.9–10.3)

## 2014-02-22 LAB — LIPID PANEL
CHOL/HDL RATIO: 5.6 ratio
Cholesterol: 239 mg/dL — ABNORMAL HIGH (ref 0–200)
HDL: 43 mg/dL (ref >39)
LDL CALC: 150 mg/dL — AB (ref 0–99)
Triglycerides: 228 mg/dL — ABNORMAL HIGH (ref <150)
VLDL: 46 mg/dL — ABNORMAL HIGH (ref 0–40)

## 2014-02-22 LAB — COMPREHENSIVE METABOLIC PANEL (CC13)
ALT: 17 U/L (ref 0–55)
AST: 14 U/L (ref 5–34)
Albumin: 3.9 g/dL (ref 3.5–5.0)
Alkaline Phosphatase: 69 U/L (ref 40–150)
Anion Gap: 8 mEq/L (ref 3–11)
BILIRUBIN TOTAL: 0.6 mg/dL (ref 0.20–1.20)
BUN: 11.9 mg/dL (ref 7.0–26.0)
CHLORIDE: 110 meq/L — AB (ref 98–109)
CO2: 23 mEq/L (ref 22–29)
CREATININE: 0.9 mg/dL (ref 0.6–1.1)
Calcium: 9.4 mg/dL (ref 8.4–10.4)
Glucose: 96 mg/dl (ref 70–140)
Potassium: 4.3 mEq/L (ref 3.5–5.1)
Sodium: 141 mEq/L (ref 136–145)
Total Protein: 7.5 g/dL (ref 6.4–8.3)

## 2014-02-22 MED ORDER — LETROZOLE 2.5 MG PO TABS: 2.5000 mg | ORAL_TABLET | Freq: Every day | ORAL | Status: DC

## 2014-02-22 MED ORDER — IBANDRONATE SODIUM 150 MG PO TABS: 150.0000 mg | ORAL_TABLET | ORAL | Status: DC

## 2014-02-22 NOTE — Telephone Encounter (Signed)
pt came in to day stating Mcid exp 8/31-appt 9/1-w/LC-wanted to know if can be seen today-per VG could put on his sch-put on sch-pt to be seen

## 2014-02-22 NOTE — Progress Notes (Signed)
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 Patient underwent mastectomy.    CHIEF COMPLIANT: Six-month followup of breast cancer  INTERVAL HISTORY: Gina Johns is a 58 year old lady originally from [place], [place]. She was diagnosed with stage II breast cancer but she felt a lump in her right breast. She underwent lumpectomy followed by radiation therapy and underwent full axillary lymph node dissection in [place]. Subsequently she underwent mastectomy. She has been on adjuvant therapy with Femara and has been tolerating it very well without any major problems. She does have right arm lymphedema for which she uses a lymphedema sleeve. She complains of mild puffiness in the right posterior axillary fold. She is very concerned that her Medicaid is going to run out. She recently underwent mammograms and bone density is here today to discuss those results.   REVIEW OF SYSTEMS:   Constitutional: Denies fevers, chills or abnormal weight loss Eyes: Denies blurriness of vision Ears, nose, mouth, throat, and face: Denies mucositis or sore throat Respiratory: Denies cough, dyspnea or wheezes Cardiovascular: Denies palpitation, chest discomfort or lower extremity swelling Gastrointestinal:   Denies nausea, heartburn or change in bowel habits Skin: Denies abnormal skin rashes Lymphatics: Denies new lymphadenopathy or easy bruising Neurological:Denies numbness, tingling or new weaknesses Behavioral/Psych: Mood is stable, no new changes  Breast: denies any pain or lumps or nodules in left breast. The right breast scar appears to be fine. Puffiness in posterior axillary fold on the right All other systems were reviewed with the patient and are negative.  I have reviewed the past medical history, past surgical history, social history and family history with the patient and they are unchanged from previous note.  ALLERGIES:  has No Known Allergies.  MEDICATIONS:  Current Outpatient Prescriptions  Medication Sig Dispense Refill  . cholecalciferol (VITAMIN D) 1000 UNITS tablet Take 1,000 Units by mouth daily.      [name] glucosamine-chondroitin 500-400 MG tablet Take 1 tablet by mouth 3 (three) times daily.      [name] letrozole (FEMARA) 2.5 MG tablet Take 1 tablet (2.5 mg total) by mouth daily.  180 tablet  6  . UNABLE TO FIND Rx: L8000-Post Surgical Bras (Quantity: 6) R7408- Non-Silicone Breast Prosthesis (Quantity: 1) Dx: 174.9; Right Mastectomy  1 each  0  . UNABLE TO FIND Rx: X4481- Silicone Breast Prosthesis (Quantity: 1) Dx: 174.9; Right mastectomy  1 each  0  . ibandronate (BONIVA) 150 MG tablet Take 1 tablet (150 mg total) by mouth every 30 (thirty) days. Take in the morning with a full glass of water, on an empty stomach, and do not take anything else by mouth or lie down for the next 30 min.  3 tablet  3   No current facility-administered medications for this visit.  PHYSICAL EXAMINATION: ECOG PERFORMANCE STATUS: 0 - Asymptomatic  Filed Vitals:   02/22/14 0911  BP: 143/82  Pulse: 67  Temp: 98.2 F (36.8 C)  Resp: 18   Filed Weights   02/22/14 0911  Weight: 159 lb 6.4 oz (72.303 kg)    GENERAL:alert, no distress and comfortable SKIN: skin color, texture, turgor  are normal, no rashes or significant lesions EYES: normal, Conjunctiva are pink and non-injected, sclera clear OROPHARYNX:no exudate, no erythema and lips, buccal mucosa, and tongue normal  NECK: supple, thyroid normal size, non-tender, without nodularity LYMPH:  no palpable lymphadenopathy in the cervical, axillary or inguinal LUNGS: clear to auscultation and percussion with normal breathing effort HEART: regular rate & rhythm and no murmurs and no lower extremity edema ABDOMEN:abdomen soft, non-tender and normal bowel sounds Musculoskeletal:no cyanosis of digits and no clubbing  NEURO: alert & oriented x 3 with fluent speech, no focal motor/sensory deficits BREAST: No palpable masses lungs or nodules in either right or left breasts. No palpable axillary supraclavicular or infraclavicular adenopathy no breast tenderness or nipple discharge. Right posterior axillary fold slightly edematous with right arm lymphedema.   LABORATORY DATA:  I have reviewed the data as listed   Chemistry      Component Value Date/Time   NA 139 10/30/2013 1525   K 4.3 10/30/2013 1525   CL 107 05/12/2012 1441   CO2 22 10/30/2013 1525   BUN 17.4 10/30/2013 1525   CREATININE 0.8 10/30/2013 1525      Component Value Date/Time   CALCIUM 9.6 10/30/2013 1525   ALKPHOS 73 10/30/2013 1525   AST 15 10/30/2013 1525   ALT 14 10/30/2013 1525   BILITOT 0.31 10/30/2013 1525       Lab Results  Component Value Date   WBC 6.5 10/30/2013   HGB 13.2 10/30/2013   HCT 40.4 10/30/2013   MCV 78.4* 10/30/2013   PLT 261 10/30/2013   NEUTROABS 3.7 10/30/2013     RADIOGRAPHIC STUDIES: I have personally reviewed the radiology reports and agreed with their findings. Mammogram and left breast was normal Bone density revealed osteopenia in the back -1.4  ASSESSMENT & PLAN:  Breast cancer, right breast T2, N0, M0 invasive ductal carcinoma the right breast status post lumpectomy and axillary light lymph node dissection done in the. She had a 2 cm ER/PR  positive HER-2 negative breast cancer 15 lymph nodes were negative. She had adjuvant radiation therapy in [place]. She has been on letrozole for the past year and half and has been tolerating it fairly well. She is here for annual followup with a mammogram and a bone density test. The mammogram and physical exam were normal. The bone density test showed osteopenia I recommended starting her on Boniva. I also encouraged her to increase her calcium and vitamin D to twice daily. She will continue Femara for 5 years. I renewed her prescription for Femara. 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 She would like [place] to do a fasting lipid panel which will be done.  Return to clinic in 6 months for a followup. Patient is running out of medication. She would like to renew this. I will request social worker to see her and discuss this matter.  Right upper extremity lymphedema current events lymphedema sleeve and does exercises.  Lymphedema Currently does exercises. Advanced lymphedema sleeve  Osteopenia Bone density test showed T score of -1.4 in the spine. I recommended that she take calcium and vitamin  D twice daily along with Boniva once a month. I discussed with her that she should not take this Boniva and lie down. She needs to drink with lot of water. If she needs to extraction sinuosity of Boniva for 2 months before and 2 months after. I said there are no chronic prescription for this as well.    Orders Placed This Encounter  Procedures  . CBC with Differential    Standing Status: Future     Number of Occurrences:      Standing Expiration Date: 02/22/2015  . Comprehensive metabolic panel (Cmet) - [facility]    Standing Status: Future     Number of Occurrences:      Standing Expiration Date: 02/22/2015  . Lipid panel    Standing Status: Future     Number of Occurrences:      Standing Expiration Date: 02/22/2015   The patient has a good understanding of the  overall plan. she agrees with it. She will call with any problems that may develop before her next visit here.  I spent 25 minutes counseling the patient face to face. The total time spent in the appointment was 30 minutes and more than 50% was on counseling and review of test results    Rulon Eisenmenger, MD 02/22/2014 10:08 AM

## 2014-02-22 NOTE — Patient Instructions (Signed)
Will plan for removal of mass right chest wall

## 2014-02-22 NOTE — Assessment & Plan Note (Signed)
Currently does exercises. Advanced lymphedema sleeve

## 2014-02-22 NOTE — Assessment & Plan Note (Addendum)
T2, N0, M0 invasive ductal carcinoma the right breast status post lumpectomy and axillary light lymph node dissection done in the. She had a 2 cm ER/PR positive HER-2 negative breast cancer 15 lymph nodes were negative. She had adjuvant radiation therapy in [place]. She has been on letrozole for the past year and half and has been tolerating it fairly well. She is here for annual followup with a mammogram and a bone density test. The mammogram and physical exam were normal. The bone density test showed osteopenia I recommended starting her on Boniva. I also encouraged her to increase her calcium and vitamin D to twice daily. She will continue Femara for 5 years. I renewed her prescription for Femara. 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 She would like [place] to do a fasting lipid panel which will be done.  Return to clinic in 6 months for a followup. Patient is running out of medication. She would like to renew this. I will request social worker to see her and discuss this matter.  Right upper extremity lymphedema current events lymphedema sleeve and does exercises.

## 2014-02-22 NOTE — Assessment & Plan Note (Signed)
Bone density test showed T score of -1.4 in the spine. I recommended that she take calcium and vitamin D twice daily along with Boniva once a month. I discussed with her that she should not take this Boniva and lie down. She needs to drink with lot of water. If she needs to extraction sinuosity of Boniva for 2 months before and 2 months after. I said there are no chronic prescription for this as well.

## 2014-02-22 NOTE — Progress Notes (Signed)
Subjective:     Patient ID: Gina Johns, female   DOB: 1955/10/05, 58 y.o.   MRN: [id number]  HPI The patient is a 58 year old [place] female who is 9 months status post right mastectomy for recurrent DCIS. She continues to have some mild swelling of her right upper arm. She continues to wear a compression sleeve for this. Otherwise she has been well since her last visit. She continues to take Femara and is tolerating this well.  Review of Systems  Constitutional: Negative.   HENT: Negative.   Eyes: Negative.   Respiratory: Negative.   Cardiovascular: Negative.   Gastrointestinal: Negative.   Endocrine: Negative.   Genitourinary: Negative.   Musculoskeletal: Negative.   Skin: Negative.   Allergic/Immunologic: Negative.   Neurological: Negative.   Hematological: Negative.   Psychiatric/Behavioral: Negative.        Objective:   Physical Exam  Constitutional: She is oriented to person, place, and time. She appears well-developed and well-nourished.  HENT:  Head: Normocephalic and atraumatic.  Eyes: Conjunctivae and EOM are normal. Pupils are equal, round, and reactive to light.  Neck: Normal range of motion. Neck supple.  Cardiovascular: Normal rate, regular rhythm and normal heart sounds.   Pulmonary/Chest: Effort normal and breath sounds normal.  There is an approximately 2 cm mass in the right chest wall laterally just beneath her mastectomy incision. This mass is mobile and nontender.  Abdominal: Soft. Bowel sounds are normal.  Musculoskeletal: Normal range of motion.  Lymphadenopathy:    She has no cervical adenopathy.  Neurological: She is alert and oriented to person, place, and time.  Skin: Skin is warm and dry.  Psychiatric: She has a normal mood and affect. Her behavior is normal.       Assessment:     The patient is 9 months status post right mastectomy for recurrent DCIS and now has a new mass of her right chest wall     Plan:     At this point I recommend  excision of this mass to rule out recurrent disease. I have discussed with her in detail the risks and benefits of the operation to do this as well as some of the technical aspects and she understands and wishes to proceed

## 2014-02-22 NOTE — Telephone Encounter (Signed)
, °

## 2014-02-23 ENCOUNTER — Ambulatory Visit: Payer: Self-pay | Admitting: Adult Health

## 2014-02-23 ENCOUNTER — Encounter (INDEPENDENT_AMBULATORY_CARE_PROVIDER_SITE_OTHER): Payer: Self-pay | Admitting: General Surgery

## 2014-02-23 ENCOUNTER — Other Ambulatory Visit: Payer: Self-pay

## 2014-02-24 ENCOUNTER — Encounter (HOSPITAL_COMMUNITY): Payer: Self-pay | Admitting: *Deleted

## 2014-02-24 ENCOUNTER — Encounter (HOSPITAL_COMMUNITY): Payer: Self-pay | Admitting: Pharmacy Technician

## 2014-02-24 MED ORDER — CHLORHEXIDINE GLUCONATE 4 % EX LIQD
1.0000 "application " | Freq: Once | CUTANEOUS | Status: DC
  Filled 2014-02-24: qty 15

## 2014-02-24 MED ORDER — CEFAZOLIN SODIUM-DEXTROSE 2-3 GM-% IV SOLR
2.0000 g | INTRAVENOUS | Status: AC
  Administered 2014-02-25: 2 g via INTRAVENOUS
  Filled 2014-02-24: qty 50

## 2014-02-25 ENCOUNTER — Encounter (HOSPITAL_COMMUNITY)
Admission: RE | Disposition: A | Discharge status: Home / Self Care | Payer: Self-pay | Source: Ambulatory Visit | Attending: General Surgery

## 2014-02-25 ENCOUNTER — Encounter (HOSPITAL_COMMUNITY): Payer: Self-pay | Admitting: Certified Registered Nurse Anesthetist

## 2014-02-25 ENCOUNTER — Ambulatory Visit (HOSPITAL_COMMUNITY): Payer: Medicaid Other | Admitting: Certified Registered Nurse Anesthetist

## 2014-02-25 ENCOUNTER — Encounter (HOSPITAL_COMMUNITY): Payer: Self-pay | Admitting: *Deleted

## 2014-02-25 ENCOUNTER — Ambulatory Visit (HOSPITAL_COMMUNITY)
Admission: RE | Admit: 2014-02-25 | Discharge: 2014-02-25 | Disposition: A | Discharge status: Home / Self Care | Payer: Self-pay | Source: Ambulatory Visit | Attending: General Surgery | Admitting: General Surgery

## 2014-02-25 DIAGNOSIS — F411 Generalized anxiety disorder: Secondary | ICD-10-CM | POA: Insufficient documentation

## 2014-02-25 DIAGNOSIS — M1990 Unspecified osteoarthritis, unspecified site: Secondary | ICD-10-CM | POA: Insufficient documentation

## 2014-02-25 DIAGNOSIS — N641 Fat necrosis of breast: Secondary | ICD-10-CM | POA: Insufficient documentation

## 2014-02-25 DIAGNOSIS — Z853 Personal history of malignant neoplasm of breast: Secondary | ICD-10-CM | POA: Insufficient documentation

## 2014-02-25 DIAGNOSIS — R222 Localized swelling, mass and lump, trunk: Secondary | ICD-10-CM

## 2014-02-25 HISTORY — DX: Pure hypercholesterolemia, unspecified: E78.00

## 2014-02-25 HISTORY — PX: MASS EXCISION: SHX2000

## 2014-02-25 HISTORY — DX: Major depressive disorder, single episode, unspecified: F32.9

## 2014-02-25 HISTORY — DX: Depression, unspecified: F32.A

## 2014-02-25 SURGERY — EXCISION MASS
Anesthesia: General | Laterality: Right

## 2014-02-25 MED ORDER — LACTATED RINGERS IV SOLN
INTRAVENOUS | Status: DC
  Administered 2014-02-25 (×2): via INTRAVENOUS

## 2014-02-25 MED ORDER — ONDANSETRON HCL 4 MG/2ML IJ SOLN
INTRAMUSCULAR | Status: AC
  Filled 2014-02-25: qty 2

## 2014-02-25 MED ORDER — ACETAMINOPHEN 160 MG/5ML PO SOLN
325.0000 mg | ORAL | Status: DC | PRN
  Filled 2014-02-25: qty 20.3

## 2014-02-25 MED ORDER — OXYCODONE HCL 5 MG PO TABS: 5.0000 mg | ORAL_TABLET | Freq: Once | ORAL | Status: DC | PRN

## 2014-02-25 MED ORDER — PHENYLEPHRINE HCL 10 MG/ML IJ SOLN
INTRAMUSCULAR | Status: DC | PRN
  Administered 2014-02-25 (×3): 40 ug via INTRAVENOUS
  Administered 2014-02-25: 120 ug via INTRAVENOUS

## 2014-02-25 MED ORDER — DEXAMETHASONE SODIUM PHOSPHATE 4 MG/ML IJ SOLN
INTRAMUSCULAR | Status: DC | PRN
  Administered 2014-02-25: 8 mg via INTRAVENOUS

## 2014-02-25 MED ORDER — MIDAZOLAM HCL 2 MG/2ML IJ SOLN
INTRAMUSCULAR | Status: AC
  Filled 2014-02-25: qty 2

## 2014-02-25 MED ORDER — DEXAMETHASONE SODIUM PHOSPHATE 4 MG/ML IJ SOLN
INTRAMUSCULAR | Status: AC
  Filled 2014-02-25: qty 2

## 2014-02-25 MED ORDER — EPHEDRINE SULFATE 50 MG/ML IJ SOLN
INTRAMUSCULAR | Status: DC | PRN
  Administered 2014-02-25 (×2): 5 mg via INTRAVENOUS
  Administered 2014-02-25: 10 mg via INTRAVENOUS
  Administered 2014-02-25: 5 mg via INTRAVENOUS

## 2014-02-25 MED ORDER — ONDANSETRON HCL 4 MG/2ML IJ SOLN
INTRAMUSCULAR | Status: DC | PRN
  Administered 2014-02-25: 4 mg via INTRAVENOUS

## 2014-02-25 MED ORDER — ACETAMINOPHEN 325 MG PO TABS: 325.0000 mg | ORAL_TABLET | ORAL | Status: DC | PRN

## 2014-02-25 MED ORDER — LIDOCAINE HCL (CARDIAC) 20 MG/ML IV SOLN
INTRAVENOUS | Status: AC
  Filled 2014-02-25: qty 5

## 2014-02-25 MED ORDER — 0.9 % SODIUM CHLORIDE (POUR BTL) OPTIME
TOPICAL | Status: DC | PRN
  Administered 2014-02-25: 1000 mL

## 2014-02-25 MED ORDER — OXYCODONE-ACETAMINOPHEN 5-325 MG PO TABS: 1.0000 | ORAL_TABLET | ORAL | Status: DC | PRN

## 2014-02-25 MED ORDER — ROCURONIUM BROMIDE 50 MG/5ML IV SOLN
INTRAVENOUS | Status: AC
  Filled 2014-02-25: qty 1

## 2014-02-25 MED ORDER — SODIUM CHLORIDE 0.9 % IJ SOLN
INTRAMUSCULAR | Status: AC
Start: 2014-02-25 — End: 2014-02-25
  Filled 2014-02-25: qty 10

## 2014-02-25 MED ORDER — FENTANYL CITRATE 0.05 MG/ML IJ SOLN: 25.0000 ug | INTRAMUSCULAR | Status: DC | PRN

## 2014-02-25 MED ORDER — BUPIVACAINE-EPINEPHRINE (PF) 0.25% -1:200000 IJ SOLN
INTRAMUSCULAR | Status: AC
  Filled 2014-02-25: qty 30

## 2014-02-25 MED ORDER — FENTANYL CITRATE 0.05 MG/ML IJ SOLN
INTRAMUSCULAR | Status: AC
  Filled 2014-02-25: qty 5

## 2014-02-25 MED ORDER — PROPOFOL 10 MG/ML IV BOLUS
INTRAVENOUS | Status: AC
  Filled 2014-02-25: qty 20

## 2014-02-25 MED ORDER — PHENYLEPHRINE 40 MCG/ML (10ML) SYRINGE FOR IV PUSH (FOR BLOOD PRESSURE SUPPORT)
PREFILLED_SYRINGE | INTRAVENOUS | Status: AC
  Filled 2014-02-25: qty 10

## 2014-02-25 MED ORDER — PROPOFOL 10 MG/ML IV BOLUS
INTRAVENOUS | Status: DC | PRN
  Administered 2014-02-25: 160 mg via INTRAVENOUS

## 2014-02-25 MED ORDER — BUPIVACAINE-EPINEPHRINE 0.25% -1:200000 IJ SOLN
INTRAMUSCULAR | Status: DC | PRN
  Administered 2014-02-25: 10 mL

## 2014-02-25 MED ORDER — LIDOCAINE HCL (CARDIAC) 20 MG/ML IV SOLN
INTRAVENOUS | Status: DC | PRN
  Administered 2014-02-25: 70 mg via INTRAVENOUS

## 2014-02-25 MED ORDER — EPHEDRINE SULFATE 50 MG/ML IJ SOLN
INTRAMUSCULAR | Status: AC
  Filled 2014-02-25: qty 1

## 2014-02-25 MED ORDER — FENTANYL CITRATE 0.05 MG/ML IJ SOLN
INTRAMUSCULAR | Status: DC | PRN
  Administered 2014-02-25: 50 ug via INTRAVENOUS

## 2014-02-25 MED ORDER — MIDAZOLAM HCL 5 MG/5ML IJ SOLN
INTRAMUSCULAR | Status: DC | PRN
  Administered 2014-02-25: 2 mg via INTRAVENOUS

## 2014-02-25 MED ORDER — OXYCODONE HCL 5 MG/5ML PO SOLN: 5.0000 mg | Freq: Once | ORAL | Status: DC | PRN

## 2014-02-25 SURGICAL SUPPLY — 33 items
CANISTER SUCTION 2500CC (MISCELLANEOUS) IMPLANT
CHLORAPREP W/TINT 26ML (MISCELLANEOUS) ×2 IMPLANT
COVER SURGICAL LIGHT HANDLE (MISCELLANEOUS) ×2 IMPLANT
DECANTER SPIKE VIAL GLASS SM (MISCELLANEOUS) IMPLANT
DERMABOND ADVANCED (GAUZE/BANDAGES/DRESSINGS) ×1
DERMABOND ADVANCED .7 DNX12 (GAUZE/BANDAGES/DRESSINGS) ×1 IMPLANT
DRAPE PED LAPAROTOMY (DRAPES) ×2 IMPLANT
DRAPE UTILITY 15X26 W/TAPE STR (DRAPE) ×4 IMPLANT
ELECT CAUTERY BLADE 6.4 (BLADE) ×2 IMPLANT
ELECT REM PT RETURN 9FT ADLT (ELECTROSURGICAL) ×2
ELECTRODE REM PT RTRN 9FT ADLT (ELECTROSURGICAL) ×1 IMPLANT
GAUZE SPONGE 4X4 12PLY STRL (GAUZE/BANDAGES/DRESSINGS) IMPLANT
GLOVE BIO SURGEON STRL SZ7.5 (GLOVE) ×2 IMPLANT
GOWN STRL REUS W/ TWL LRG LVL3 (GOWN DISPOSABLE) ×2 IMPLANT
GOWN STRL REUS W/TWL LRG LVL3 (GOWN DISPOSABLE) ×2
KIT BASIN OR (CUSTOM PROCEDURE TRAY) ×2 IMPLANT
KIT ROOM TURNOVER OR (KITS) ×2 IMPLANT
NEEDLE HYPO 25X1 1.5 SAFETY (NEEDLE) ×2 IMPLANT
NS IRRIG 1000ML POUR BTL (IV SOLUTION) ×2 IMPLANT
PACK SURGICAL SETUP 50X90 (CUSTOM PROCEDURE TRAY) ×2 IMPLANT
PAD ARMBOARD 7.5X6 YLW CONV (MISCELLANEOUS) ×2 IMPLANT
PENCIL BUTTON HOLSTER BLD 10FT (ELECTRODE) ×2 IMPLANT
SPECIMEN JAR SMALL (MISCELLANEOUS) ×2 IMPLANT
SPONGE LAP 18X18 X RAY DECT (DISPOSABLE) ×2 IMPLANT
SUT MNCRL AB 4-0 PS2 18 (SUTURE) ×2 IMPLANT
SUT VIC AB 3-0 SH 27 (SUTURE) ×2
SUT VIC AB 3-0 SH 27XBRD (SUTURE) ×2 IMPLANT
SYR BULB 3OZ (MISCELLANEOUS) ×2 IMPLANT
SYR CONTROL 10ML LL (SYRINGE) ×2 IMPLANT
TOWEL OR 17X24 6PK STRL BLUE (TOWEL DISPOSABLE) ×2 IMPLANT
TOWEL OR 17X26 10 PK STRL BLUE (TOWEL DISPOSABLE) ×2 IMPLANT
TUBE CONNECTING 12X1/4 (SUCTIONS) IMPLANT
YANKAUER SUCT BULB TIP NO VENT (SUCTIONS) IMPLANT

## 2014-02-25 NOTE — Transfer of Care (Signed)
Immediate Anesthesia Transfer of Care Note  Patient: Gina Johns  Procedure(s) Performed: Procedure(s): EXCISION MASS RIGHT CHEST WALL (Right)  Patient Location: PACU  Anesthesia Type:General  Level of Consciousness: awake, alert , oriented and patient cooperative  Airway & Oxygen Therapy: Patient Spontanous Breathing and Patient connected to nasal cannula oxygen  Post-op Assessment: Report given to PACU RN and Post -op Vital signs reviewed and stable  Post vital signs: Reviewed  Complications: No apparent anesthesia complications

## 2014-02-25 NOTE — Interval H&P Note (Signed)
History and Physical Interval Note:  02/25/2014 10:49 AM  Gina Johns  has presented today for surgery, with the diagnosis of right breast cancer  The various methods of treatment have been discussed with the patient and family. After consideration of risks, benefits and other options for treatment, the patient has consented to  Procedure(s): EXCISION MASS RIGHT CHEST WALL (Right) as a surgical intervention .  The patient's history has been reviewed, patient examined, no change in status, stable for surgery.  I have reviewed the patient's chart and labs.  Questions were answered to the patient's satisfaction.     TOTH III,PAUL S

## 2014-02-25 NOTE — Anesthesia Preprocedure Evaluation (Addendum)
Anesthesia Evaluation  Patient identified by MRN, date of birth, ID band Patient awake    Reviewed: Allergy & Precautions, H&P , NPO status , Patient's Chart, lab work & pertinent test results  History of Anesthesia Complications Negative for: history of anesthetic complications  Airway Mallampati: I TM Distance: >3 FB Neck ROM: Full    Dental  (+) Dental Advisory Given, Teeth Intact   Pulmonary neg pulmonary ROS,  breath sounds clear to auscultation        Cardiovascular negative cardio ROS  Rhythm:Regular Rate:Normal     Neuro/Psych PSYCHIATRIC DISORDERS Anxiety negative neurological ROS     GI/Hepatic negative GI ROS, Neg liver ROS,   Endo/Other  negative endocrine ROS  Renal/GU negative Renal ROS     Musculoskeletal negative musculoskeletal ROS (+) Arthritis -,   Abdominal   Peds  Hematology negative hematology ROS (+)   Anesthesia Other Findings   Reproductive/Obstetrics                          Anesthesia Physical Anesthesia Plan  ASA: II  Anesthesia Plan: General   Post-op Pain Management:    Induction: Intravenous  Airway Management Planned: LMA  Additional Equipment:   Intra-op Plan:   Post-operative Plan: Extubation in OR  Informed Consent: I have reviewed the patients History and Physical, chart, labs and discussed the procedure including the risks, benefits and alternatives for the proposed anesthesia with the patient or authorized representative who has indicated his/her understanding and acceptance.   Dental advisory given  Plan Discussed with: CRNA and Surgeon  Anesthesia Plan Comments:         Anesthesia Quick Evaluation

## 2014-02-25 NOTE — Discharge Instructions (Signed)

## 2014-02-25 NOTE — H&P (View-Only) (Signed)
Subjective:     Patient ID: Gina Johns, female   DOB: [birth year], 58 y.o.   MRN: [id number]  HPI The patient is a 58 year old [place] female who is 9 months status post right mastectomy for recurrent DCIS. She continues to have some mild swelling of her right upper arm. She continues to wear a compression sleeve for this. Otherwise she has been well since her last visit. She continues to take Femara and is tolerating this well.  Review of Systems  Constitutional: Negative.   HENT: Negative.   Eyes: Negative.   Respiratory: Negative.   Cardiovascular: Negative.   Gastrointestinal: Negative.   Endocrine: Negative.   Genitourinary: Negative.   Musculoskeletal: Negative.   Skin: Negative.   Allergic/Immunologic: Negative.   Neurological: Negative.   Hematological: Negative.   Psychiatric/Behavioral: Negative.        Objective:   Physical Exam  Constitutional: She is oriented to person, place, and time. She appears well-developed and well-nourished.  HENT:  Head: Normocephalic and atraumatic.  Eyes: Conjunctivae and EOM are normal. Pupils are equal, round, and reactive to light.  Neck: Normal range of motion. Neck supple.  Cardiovascular: Normal rate, regular rhythm and normal heart sounds.   Pulmonary/Chest: Effort normal and breath sounds normal.  There is an approximately 2 cm mass in the right chest wall laterally just beneath her mastectomy incision. This mass is mobile and nontender.  Abdominal: Soft. Bowel sounds are normal.  Musculoskeletal: Normal range of motion.  Lymphadenopathy:    She has no cervical adenopathy.  Neurological: She is alert and oriented to person, place, and time.  Skin: Skin is warm and dry.  Psychiatric: She has a normal mood and affect. Her behavior is normal.       Assessment:     The patient is 9 months status post right mastectomy for recurrent DCIS and now has a new mass of her right chest wall     Plan:     At this point I recommend  excision of this mass to rule out recurrent disease. I have discussed with her in detail the risks and benefits of the operation to do this as well as some of the technical aspects and she understands and wishes to proceed

## 2014-02-25 NOTE — Op Note (Signed)
02/25/2014  12:08 PM  PATIENT:  Gina Johns  58 y.o. female  PRE-OPERATIVE DIAGNOSIS:  history of right breast cancer, mass right chest wall   POST-OPERATIVE DIAGNOSIS:  history of right breast cancer, mass right chest wall  PROCEDURE:  Procedure(s): EXCISION MASS RIGHT CHEST WALL (Right)  SURGEON:  Surgeon(s) and Role:    * Jovita Kussmaul, MD - Primary  PHYSICIAN ASSISTANT:   ASSISTANTS: none   ANESTHESIA:   general  EBL:  Total I/O In: 1000 [I.V.:1000] Out: -   BLOOD ADMINISTERED:none  DRAINS: none   LOCAL MEDICATIONS USED:  MARCAINE     SPECIMEN:  Source of Specimen:  right chest wall mass  DISPOSITION OF SPECIMEN:  PATHOLOGY  COUNTS:  YES  TOURNIQUET:  * No tourniquets in log *  DICTATION: .Dragon Dictation After informed consent was obtained the patient was brought to the operating room and placed in the supine position on the operating room table. After adequate induction of general anesthesia the patient's right chest wall was prepped with ChloraPrep, allowed to dry, and draped in usual sterile manner. The area around the palpable mass was infiltrated with quarter percent Marcaine. An elliptical incision was made overlying the palpable mass with a 15 blade knife. The mass was excised sharply with the 15 blade knife. In removing the mass we did identify a small residual seroma measuring about 4 x 2 cm. The anterior wall of the seroma was taken with the mass. Once the mass was removed it was oriented with a short stitch on the superior surface and a long stitch on the lateral surface. The mass was then sent to pathology for further evaluation. The residual seroma cavity was fulgurated with cautery. Hemostasis was also achieved using the Bovie electrocautery. The deep layer the wound was then closed with interrupted 3-0 Vicryl stitches. The skin was closed with interrupted 4-0 Monocryl subcuticular stitches. Dermabond dressings were applied. The patient tolerated the  procedure well. At the end of the case all needle sponge and instrument counts were correct. The patient was then awakened and taken to recovery in stable condition. The mass itself measured approximately 2 cm in diameter.  PLAN OF CARE: Discharge to home after PACU  PATIENT DISPOSITION:  PACU - hemodynamically stable.   Delay start of Pharmacological VTE agent (>24hrs) due to surgical blood loss or risk of bleeding: not applicable

## 2014-02-25 NOTE — Anesthesia Postprocedure Evaluation (Signed)
  Anesthesia Post-op Note  Patient: Gina Johns  Procedure(s) Performed: Procedure(s): EXCISION MASS RIGHT CHEST WALL (Right)  Patient Location: PACU  Anesthesia Type:General  Level of Consciousness: awake, alert  and oriented  Airway and Oxygen Therapy: Patient Spontanous Breathing  Post-op Pain: none  Post-op Assessment: Post-op Vital signs reviewed, Patient's Cardiovascular Status Stable, Respiratory Function Stable, Patent Airway, No signs of Nausea or vomiting and Pain level controlled  Post-op Vital Signs: Reviewed and stable  Last Vitals:  Filed Vitals:   02/25/14 1406  BP: 140/83  Pulse: 82  Temp:   Resp:     Complications: No apparent anesthesia complications

## 2014-02-25 NOTE — Anesthesia Procedure Notes (Signed)
Procedure Name: LMA Insertion Date/Time: 02/25/2014 11:33 AM Performed by: Luciana Axe K Pre-anesthesia Checklist: Patient identified, Emergency Drugs available, Suction available, Patient being monitored and Timeout performed Patient Re-evaluated:Patient Re-evaluated prior to inductionOxygen Delivery Method: Circle system utilized Preoxygenation: Pre-oxygenation with 100% oxygen Intubation Type: IV induction Ventilation: Mask ventilation without difficulty LMA: LMA inserted LMA Size: 4.0 Number of attempts: 1 Placement Confirmation: positive ETCO2,  CO2 detector and breath sounds checked- equal and bilateral Tube secured with: Tape Dental Injury: Teeth and Oropharynx as per pre-operative assessment

## 2014-02-26 ENCOUNTER — Encounter (HOSPITAL_COMMUNITY): Payer: Self-pay | Admitting: General Surgery

## 2014-04-26 ENCOUNTER — Encounter (HOSPITAL_COMMUNITY): Payer: Self-pay | Admitting: General Surgery

## 2014-07-16 ENCOUNTER — Telehealth: Payer: Self-pay | Admitting: Hematology and Oncology

## 2014-07-16 NOTE — Telephone Encounter (Signed)
pt cld to CX appt-stated will call back to r/s @ a later time

## 2014-07-22 ENCOUNTER — Other Ambulatory Visit: Payer: Self-pay

## 2014-07-22 ENCOUNTER — Ambulatory Visit: Payer: Self-pay | Admitting: Hematology and Oncology

## 2014-12-30 ENCOUNTER — Other Ambulatory Visit: Payer: Self-pay | Admitting: Hematology and Oncology

## 2014-12-31 ENCOUNTER — Telehealth: Payer: Self-pay | Admitting: Hematology and Oncology

## 2014-12-31 ENCOUNTER — Encounter: Payer: Self-pay | Admitting: Hematology and Oncology

## 2014-12-31 ENCOUNTER — Other Ambulatory Visit: Payer: Self-pay | Admitting: *Deleted

## 2014-12-31 NOTE — Progress Notes (Signed)
I placed prior auth form on desk of nurse for dr. Lindi Adie for Ibandronate

## 2014-12-31 NOTE — Telephone Encounter (Signed)
per pof to sch pt appt-cld & spoke to pt adv of appt-adv I would mail copy of avs-mailed

## 2014-12-31 NOTE — Progress Notes (Signed)
Per dr. Patient has to have an appt to get meds refilled. I faxed back to [facility] outpatient pharmacy

## 2015-02-11 ENCOUNTER — Telehealth: Payer: Self-pay | Admitting: Hematology and Oncology

## 2015-02-11 NOTE — Telephone Encounter (Signed)
?  spouse called and left a message to cancel 8/22 and will call back to reschedule

## 2015-02-14 ENCOUNTER — Ambulatory Visit: Payer: Self-pay | Admitting: Hematology and Oncology

## 2015-02-21 ENCOUNTER — Encounter: Payer: Self-pay | Admitting: Sports Medicine

## 2015-02-21 ENCOUNTER — Ambulatory Visit (INDEPENDENT_AMBULATORY_CARE_PROVIDER_SITE_OTHER): Payer: No Typology Code available for payment source | Admitting: Sports Medicine

## 2015-02-21 VITALS — BP 116/74 | Ht 63.0 in | Wt 159.0 lb

## 2015-02-21 DIAGNOSIS — M25562 Pain in left knee: Secondary | ICD-10-CM

## 2015-02-21 DIAGNOSIS — M25561 Pain in right knee: Secondary | ICD-10-CM

## 2015-02-21 NOTE — Progress Notes (Signed)
   Subjective:    Patient ID: Gina Johns, female    DOB: Jan 19, 1956, 59 y.o.   MRN: [id number]  HPI chief complaint: Left knee pain, right foot pain  Patient comes in today complaining of 2 weeks of lateral sided left knee pain. She slipped and fell on the left knee 2 weeks ago. No swelling. Since then she has had pain with walking although she has been able to bear weight. She has not had problems with this knee in the past but does have a history of right knee osteoarthritis. She denies any locking or catching. No feelings of instability. She has not felt the need to take any medication. She also has pain with kneeling directly on the left knee. No numbness or tingling.  She is also complaining of chronic pain along the right instep. No trauma. No swelling. Worse with walking. No numbness or tingling.  She is here today with her husband  Interim medical history reviewed    Review of Systems     Objective:   Physical Exam Well-developed, well-nourished. No acute distress  Left knee: Full range of motion. No effusion. No soft tissue swelling. Extensor mechanism is intact. Slight tenderness to palpation along the fibular head and lateral tibia but not marked. Knee is stable to ligament is exam. Negative McMurray's. Neurovascularly intact distally. She is able to fully bear weight and walks with only a slight limp.  Right foot: Tender to palpation at the right navicular. No soft tissue swelling. Pes planus with standing. Otherwise, no bony or soft tissue tenderness to direct palpation. Walking without a limp.       Assessment & Plan:  Left knee pain likely secondary to simple contusion Right foot pain secondary to pes planus  Reassurance regarding the left knee. Patient did not bring in any sort of shoewear that will accommodate an insert. She will return to the office in 2-3 weeks for reevaluation of the left knee. If symptoms persist we did discuss the possibility of getting an  x-ray to evaluate the degree of osteoarthritis present. She is reassured that I do not believe she has any sort of fracture. I did provide her with a Body Helix knee sleeve for compression and support. We will also plan on getting her some green sports insoles and scaphoid pads for her shoes.

## 2015-02-22 ENCOUNTER — Encounter: Payer: Self-pay | Admitting: Hematology and Oncology

## 2015-02-22 NOTE — Progress Notes (Signed)
I faxed [employer] prior auth for ibandronate.

## 2015-02-22 NOTE — Progress Notes (Signed)
I sent [employer] notes for prior auth for ibandronate. They have denied saying not medically neccesary

## 2015-02-23 ENCOUNTER — Other Ambulatory Visit: Payer: Self-pay | Admitting: Hematology and Oncology

## 2015-03-07 ENCOUNTER — Ambulatory Visit
Admission: RE | Admit: 2015-03-07 | Discharge: 2015-03-07 | Disposition: A | Discharge status: Home / Self Care | Payer: No Typology Code available for payment source | Source: Ambulatory Visit | Attending: Sports Medicine | Admitting: Sports Medicine

## 2015-03-07 ENCOUNTER — Encounter: Payer: Self-pay | Admitting: Sports Medicine

## 2015-03-07 ENCOUNTER — Ambulatory Visit (INDEPENDENT_AMBULATORY_CARE_PROVIDER_SITE_OTHER): Payer: No Typology Code available for payment source | Admitting: Sports Medicine

## 2015-03-07 VITALS — BP 129/93 | Ht 63.0 in | Wt 159.0 lb

## 2015-03-07 DIAGNOSIS — M25562 Pain in left knee: Secondary | ICD-10-CM

## 2015-03-07 NOTE — Progress Notes (Signed)
   Subjective:    Patient ID: Gina Johns, female    DOB: [birth year], 59 y.o.   MRN: [id number]  HPI   Patient comes in today complaining of worsening left knee pain. She is status fell about a month ago. Pain is primarily along the medial knee. Worse with walking. She has noticed some mild swelling. No mechanical symptoms. The body helix compression sleeve given to her at her last office visit made her knee pain worse. We had discussed the possibility of an x-ray if symptoms persisted. She is here today with her son.    Review of Systems     Objective:   Physical Exam  Well-developed, well-nourished. No acute distress. Sitting comfortably in the exam room  Left knee: Full range of motion. There may be a trace effusion. She is tender to palpation along the medial joint line. Negative McMurray's. Good joint stability. Neurovascularly intact distally. Walking with a slight limp.      Assessment & Plan:  Persistent left knee pain status post fall-rule out fracture versus osteoarthritis  Patient will get x-rays today of the left knee including AP, lateral, sunrise, and 30 flexion views. Follow-up tomorrow afternoon to discuss those results. If no fracture is seen then I would consider a cortisone injection. I've also asked her to bring in some tennis shoes so that we can fit those with some green sports insoles and scaphoid pads for chronic foot pain that she has secondary to pes planus.

## 2015-03-08 ENCOUNTER — Ambulatory Visit (INDEPENDENT_AMBULATORY_CARE_PROVIDER_SITE_OTHER): Payer: No Typology Code available for payment source | Admitting: Sports Medicine

## 2015-03-08 ENCOUNTER — Encounter: Payer: Self-pay | Admitting: Sports Medicine

## 2015-03-08 VITALS — BP 118/76 | Ht 63.0 in | Wt 159.0 lb

## 2015-03-08 DIAGNOSIS — M25562 Pain in left knee: Secondary | ICD-10-CM

## 2015-03-08 MED ORDER — METHYLPREDNISOLONE ACETATE 40 MG/ML IJ SUSP
40.0000 mg | Freq: Once | INTRAMUSCULAR | Status: AC
  Administered 2015-03-08: 40 mg via INTRA_ARTICULAR

## 2015-03-08 NOTE — Progress Notes (Addendum)
Patient ID: Gina Johns, female   DOB: [birth year], 59 y.o.   MRN: [id number]  Patient comes in today to discuss x-ray findings of her left knee. X-ray shows a paucity of degenerative changes. She does have what appears to be several small loose bodies but she denies any locking in the knee. Main complaint is lateral sided knee pain and swelling since her fall a few weeks ago. I briefly discussed her x-ray findings with her on the phone yesterday and recommended that she return to the office today for a cortisone injection. She has also brought in some tennis shoes that will accommodate a green sports insole and scaphoid pads. She will follow-up with me in 3 weeks. If knee pain persists despite today's injection then I would consider further diagnostic imaging specifically to further evaluate the potential loose body seen on plain x-ray as well as to rule out significant soft tissue pathology such as a lateral meniscal tear.  Consent obtained and verified. Time-out conducted. Noted no overlying erythema, induration, or other signs of local infection. Skin prepped in a sterile fashion. Topical analgesic spray: Ethyl chloride. Joint: left knee, anterior medial approach Needle: 22g 1.5 inch Completed without difficulty. Meds: 3cc 1% xylocaine, 1cc(40mg ) depomedrol  Advised to call if fevers/chills, erythema, induration, drainage, or persistent bleeding.  Please note that the injection was carried out after verbal consent was obtained. Risks and benefits of the procedure were explained to the patient including the risk of infection and steroid flare. Patient wished to proceed after risks and benefits were explained.

## 2015-03-14 ENCOUNTER — Ambulatory Visit: Payer: Self-pay | Admitting: Sports Medicine

## 2015-04-11 ENCOUNTER — Ambulatory Visit: Payer: No Typology Code available for payment source | Admitting: Sports Medicine

## 2015-04-11 ENCOUNTER — Other Ambulatory Visit: Payer: Self-pay | Admitting: Hematology and Oncology

## 2015-04-11 NOTE — Telephone Encounter (Signed)
Chart reviewed.

## 2015-04-25 ENCOUNTER — Encounter: Payer: Self-pay | Admitting: *Deleted

## 2015-04-26 ENCOUNTER — Ambulatory Visit (INDEPENDENT_AMBULATORY_CARE_PROVIDER_SITE_OTHER): Payer: No Typology Code available for payment source | Admitting: Sports Medicine

## 2015-04-26 ENCOUNTER — Encounter: Payer: Self-pay | Admitting: Sports Medicine

## 2015-04-26 VITALS — BP 142/74 | Ht 63.0 in | Wt 156.0 lb

## 2015-04-26 DIAGNOSIS — M25562 Pain in left knee: Secondary | ICD-10-CM

## 2015-04-26 DIAGNOSIS — M25571 Pain in right ankle and joints of right foot: Secondary | ICD-10-CM | POA: Diagnosis not present

## 2015-04-26 NOTE — Progress Notes (Signed)
   Subjective:    Patient ID: Gina Johns, female    DOB: [birth year], 59 y.o.   MRN: [id number]  HPI chief complaint: Left knee pain and right ankle pain  Patient comes in today with persistent left knee pain. Cortisone injection administered a few weeks ago did not alleviate her pain. It is still diffuse. No mechanical symptoms. No swelling. It is worse with prolonged standing and walking. No radiating pain. Previous x-rays showed some mild degenerative changes and questionable loose bodies.  She is also complaining of medial sided right ankle pain. Prior to this she was getting some pain in the right arch which I treated with a scaphoid pad and green sports insoles. Her arch pain resolved but her ankle pain persists. Pain is worse with activity. No swelling. No numbness or tingling.      Review of Systems     Objective:   Physical Exam  Well-developed, well-nourished. No acute distress. Sitting comfortable in exam room  Left knee: Full range of motion. No effusion. She is tender to palpation along the lateral joint line but a negative McMurray's. No tenderness along the medial joint line. Good ligamentous stability. Neurovascularly intact distally.  Right ankle: Full range of motion. No effusion. No soft tissue swelling. She is tender to palpation directly over the medial malleolus. Negative Tinel's at the tarsal tunnel. No other bony or soft tissue tenderness to direct palpation. Good dorsalis and posterior tibial pulses. Walking without a limp.      Assessment & Plan:  Persistent left knee pain secondary to mild DJD versus meniscal tear with x-ray evidence of possible loose bodies Right ankle pain  MRI of the left knee to better evaluate the possible loose bodies seen on plain film as well as to rule out a meniscal tear. I will get x-rays of the right ankle specifically to rule out any obvious bony abnormality of the medial malleolus. Phone follow-up with these results once  available at which point we'll delineate further treatment.

## 2015-04-27 ENCOUNTER — Other Ambulatory Visit: Payer: Self-pay | Admitting: Hematology and Oncology

## 2015-04-27 NOTE — Telephone Encounter (Signed)
Chart reviewed.

## 2015-05-05 ENCOUNTER — Telehealth: Payer: Self-pay | Admitting: Sports Medicine

## 2015-05-05 ENCOUNTER — Other Ambulatory Visit: Payer: Self-pay | Admitting: *Deleted

## 2015-05-05 ENCOUNTER — Encounter: Payer: Self-pay | Admitting: *Deleted

## 2015-05-05 DIAGNOSIS — S83207A Unspecified tear of unspecified meniscus, current injury, left knee, initial encounter: Secondary | ICD-10-CM

## 2015-05-05 NOTE — Telephone Encounter (Signed)
I spoke with the patient on the phone today about the MRI of her left knee. MRI shows a rather large lateral meniscal tear with a possible flap component. There is also a concomitant PCL tear. This is all in the setting of moderate osteoarthritis. She has failed conservative treatment including cortisone injections. I would like to refer her to Dr. Mardelle Matte for further treatment.  I also discussed with her the x-ray findings of her right ankle. She has some mild degenerative changes with spurring at the tips of the medial and lateral malleolus. Nothing acute is seen. She'll continue with conservative management of this condition.

## 2015-05-09 ENCOUNTER — Ambulatory Visit: Payer: Self-pay | Admitting: Sports Medicine

## 2015-05-09 ENCOUNTER — Encounter: Payer: Self-pay | Admitting: Sports Medicine

## 2015-05-31 ENCOUNTER — Other Ambulatory Visit: Payer: Self-pay | Admitting: *Deleted

## 2015-06-06 ENCOUNTER — Ambulatory Visit: Payer: No Typology Code available for payment source

## 2015-06-06 ENCOUNTER — Other Ambulatory Visit: Payer: Self-pay

## 2015-06-06 ENCOUNTER — Ambulatory Visit: Payer: Self-pay | Admitting: Hematology & Oncology

## 2015-06-15 ENCOUNTER — Other Ambulatory Visit: Payer: Self-pay

## 2015-06-15 ENCOUNTER — Ambulatory Visit: Payer: Self-pay | Admitting: Hematology & Oncology

## 2015-06-15 ENCOUNTER — Ambulatory Visit: Payer: Self-pay

## 2015-08-03 MED FILL — LETROZOLE 2.5 MG TABLET: 2.5 | 90 days supply | Qty: 90 | Fill #0

## 2015-10-11 ENCOUNTER — Telehealth: Payer: Self-pay | Admitting: Internal Medicine

## 2015-10-11 NOTE — Telephone Encounter (Signed)
Spoke with the pt and rescheduled her appt for 10/13/15 Nothing further needed

## 2015-10-11 NOTE — Telephone Encounter (Signed)
Pt would like to see if MW could see her sooner for consult (this week) and needs afternoon appts. Aware I will send to MW and Magda Paganini to see if okay to use a 15 min slot-pt wanted Thursday this week.

## 2015-10-13 ENCOUNTER — Encounter: Payer: Self-pay | Admitting: Internal Medicine

## 2015-10-13 ENCOUNTER — Ambulatory Visit (INDEPENDENT_AMBULATORY_CARE_PROVIDER_SITE_OTHER): Payer: BLUE CROSS/BLUE SHIELD | Admitting: Internal Medicine

## 2015-10-13 VITALS — BP 124/86 | HR 86 | Ht 64.0 in | Wt 159.2 lb

## 2015-10-13 DIAGNOSIS — R059 Cough, unspecified: Secondary | ICD-10-CM

## 2015-10-13 DIAGNOSIS — R058 Other specified cough: Secondary | ICD-10-CM

## 2015-10-13 DIAGNOSIS — R05 Cough: Secondary | ICD-10-CM

## 2015-10-13 MED ORDER — FAMOTIDINE 20 MG PO TABS: ORAL_TABLET | ORAL | Status: DC

## 2015-10-13 MED ORDER — PANTOPRAZOLE SODIUM 40 MG PO TBEC: 40.0000 mg | DELAYED_RELEASE_TABLET | Freq: Every day | ORAL | Status: DC

## 2015-10-13 MED ORDER — PREDNISONE 10 MG PO TABS
ORAL_TABLET | ORAL | Status: DC
Start: 2015-10-13 — End: 2015-11-14

## 2015-10-13 NOTE — Patient Instructions (Signed)
Pantoprazole (protonix) 40 mg   Take  30-60 min before first meal of the day and Pepcid (famotidine)  20 mg one @  bedtime until return to office - this is the best way to tell whether stomach acid is contributing to your problem.    GERD (REFLUX)  is an extremely common cause of respiratory symptoms just like yours , many times with no obvious heartburn at all.    It can be treated with medication, but also with lifestyle changes including elevation of the head of your bed (ideally with 6 inch  bed blocks),  Smoking cessation, avoidance of late meals, excessive alcohol, and avoid fatty foods, chocolate, peppermint, colas, red wine, and acidic juices such as orange juice.  NO MINT OR MENTHOL PRODUCTS SO NO COUGH DROPS  USE SUGARLESS CANDY INSTEAD (Jolley ranchers or Stover's or Life Savers) or even ice chips will also do - the key is to swallow to prevent all throat clearing. NO OIL BASED VITAMINS - use powdered substitutes.   Prednisone 10 mg take  4 each am x 2 days,   2 each am x 2 days,  1 each am x 2 days and stop   Only use your albuterol as a rescue medication to be used if you can't catch your breath by resting or doing a relaxed purse lip breathing pattern.  - The less you use it, the better it will work when you need it. - Ok to use up to 2 puffs  every 4 hours if you must but call for immediate appointment if use goes up over your usual need - Don't leave home without it !!  (think of it like the spare tire for your car)    Please schedule a follow up office visit in 4 weeks, sooner if needed

## 2015-10-13 NOTE — Progress Notes (Signed)
Subjective:    Patient ID: Gina Johns, female    DOB: [birth year],     MRN: [id number]  HPI  60 yo [place] female ([place]) healthy there and here x 2007 until 2016 with cough/ wheez that comes and goes so referred to pulmonary clinic 10/13/2015 by Dr Marlyn Corporal    10/13/2015 1st [facility] office visit/ Wert   Chief Complaint  Patient presents with  . Pulmonary Consult    Referrede by Dr. Juanita Craver. Pt c/o cough x 6 months. Cough is prod at times with clear to beige sputum. She states cough is worse if she eats or drinks something cold. Neb txs have helped in the past, but she is unsure of what med.   waxes and wanes x 1.5 years, ? Related to cold water swallowing, does not wake her up  Better with neb but not ventolin hfa Onset was while on Boniva and last took it ? Nov 2016  And not yet treated for reflux / never took prednisone for this cough to her knowledge.  No obvious other patterns in day to day or daytime variabilty or assoc sob or cp or chest tightness,   overt sinus or hb symptoms. No unusual exp hx or h/o childhood pna/ asthma or knowledge of premature birth.  Sleeping ok without nocturnal  or early am exacerbation  of respiratory  c/o's or need for noct saba. Also denies any obvious fluctuation of symptoms with weather or environmental changes or other aggravating or alleviating factors except as outlined above   Current Medications, Allergies, Complete Past Medical History, Past Surgical History, Family History, and Social History were reviewed in [employer] record.               Review of Systems  Constitutional: Negative for fever, chills and unexpected weight change.  HENT: Negative for congestion, dental problem, ear pain, nosebleeds, postnasal drip, rhinorrhea, sinus pressure, sneezing, sore throat, trouble swallowing and voice change.   Eyes: Negative for visual disturbance.  Respiratory: Positive for  cough. Negative for choking and shortness of breath.   Cardiovascular: Negative for chest pain and leg swelling.  Gastrointestinal: Negative for vomiting, abdominal pain and diarrhea.  Genitourinary: Negative for difficulty urinating.  Musculoskeletal: Negative for arthralgias.  Skin: Negative for rash.  Neurological: Negative for tremors, syncope and headaches.  Hematological: Does not bruise/bleed easily.       Objective:   Physical Exam amb indian female nad  Wt Readings from Last 3 Encounters:  10/13/15 159 lb 3.2 oz (72.213 kg)  04/26/15 156 lb (70.761 kg)  03/08/15 159 lb (72.122 kg)    Vital signs reviewed   HEENT: nl dentition, turbinates, and oropharynx. Nl external ear canals without cough reflex   NECK :  without JVD/Nodes/TM/ nl carotid upstrokes bilaterally   LUNGS: no acc muscle use,  Nl contour chest with classic exp pseudowheeze/ all upper airway and resolves with plm    CV:  RRR  no s3 or murmur or increase in P2, no edema   ABD:  soft and nontender with nl inspiratory excursion in the supine position. No bruits or organomegaly, bowel sounds nl  MS:  Nl gait/ ext warm without deformities, calf tenderness, cyanosis or clubbing No obvious joint restrictions   SKIN: warm and dry without lesions    NEURO:  alert, approp, nl sensorium with  no motor deficits     cxr per [place] [facility] : wnl  Assessment & Plan:

## 2015-10-14 NOTE — Assessment & Plan Note (Addendum)
Spirometry 10/13/2015  wnl with active "wheeze"   The most common causes of chronic cough in immunocompetent adults include the following: upper airway cough syndrome (UACS), previously referred to as postnasal drip syndrome (PNDS), which is caused by variety of rhinosinus conditions; (2) asthma; (3) GERD; (4) chronic bronchitis from cigarette smoking or other inhaled environmental irritants; (5) nonasthmatic eosinophilic bronchitis; and (6) bronchiectasis.   These conditions, singly or in combination, have accounted for up to 94% of the causes of chronic cough in prospective studies.   Other conditions have constituted no >6% of the causes in prospective studies These have included bronchogenic carcinoma, chronic interstitial pneumonia, sarcoidosis, left ventricular failure, ACEI-induced cough, and aspiration from a condition associated with pharyngeal dysfunction.    Chronic cough is often simultaneously caused by more than one condition. A single cause has been found from 38 to 82% of the time, multiple causes from 18 to 62%. Multiply caused cough has been the result of three diseases up to 42% of the time.       Based on hx and exam, this is most likely:  Classic Upper airway cough syndrome, so named because it's frequently impossible to sort out how much is  CR/sinusitis with freq throat clearing (which can be related to primary GERD)   vs  causing  secondary (" extra esophageal")  GERD from wide swings in gastric pressure that occur with throat clearing, often  promoting self use of mint and menthol lozenges that reduce the lower esophageal sphincter tone and exacerbate the problem further in a cyclical fashion.   These are the same pts (now being labeled as having "irritable larynx syndrome" by some cough centers) who not infrequently have a history of having failed to tolerate ace inhibitors,  dry powder inhalers or biphosphonates (like boniva, which she was ? on at onset of symptoms) or report  having atypical reflux symptoms that don't respond to standard doses of PPI , and are easily confused as having aecopd or asthma flares by even experienced allergists/ pulmonologists.   The first step is to maximize acid suppression/GERD Rx  and eliminate cyclical coughing with non min and menthol hard rock candy  then regroup in 4 weeks.  I had an extended discussion with the patient reviewing all relevant studies completed to date and  lasting 35 min  1) Explained: The standardized cough guidelines published in Chest by Lissa Morales in 2006 are still the best available and consist of a multiple step process (up to 12!) , not a single office visit,  and are intended  to address this problem logically,  with an alogrithm dependent on response to empiric treatment at  each progressive step  to determine a specific diagnosis with  minimal addtional testing needed. Therefore if adherence is an issue or can't be accurately verified,  it's very unlikely the standard evaluation and treatment will be successful here.    Furthermore, response to therapy (other than acute cough suppression, which should only be used short term with avoidance of narcotic containing cough syrups if possible), can be a gradual process for which the patient may perceive immediate benefit.  Unlike going to an eye doctor where the best perscription is almost always the first one and is immediately effective, this is almost never the case in the management of chronic cough syndromes. Therefore the patient needs to commit up front to consistently adhere to recommendations  for up to 6 weeks of therapy directed at the likely underlying problem(s) before  the response can be reasonably evaluated.     2) Each maintenance medication was reviewed in detail including most importantly the difference between maintenance and prns and under what circumstances the prns are to be triggered using an action plan format that is not reflected in the  computer generated alphabetically organized AVS.    Please see instructions for details which were reviewed in writing and the patient given a copy highlighting the part that I personally wrote and discussed at today's ov.   See instructions for specific recommendations which were reviewed directly with the patient who was given a copy with highlighter outlining the key components.

## 2015-10-25 ENCOUNTER — Institutional Professional Consult (permissible substitution): Payer: Self-pay | Admitting: Internal Medicine

## 2015-11-14 ENCOUNTER — Ambulatory Visit (INDEPENDENT_AMBULATORY_CARE_PROVIDER_SITE_OTHER): Payer: BLUE CROSS/BLUE SHIELD | Admitting: Internal Medicine

## 2015-11-14 ENCOUNTER — Encounter: Payer: Self-pay | Admitting: Internal Medicine

## 2015-11-14 ENCOUNTER — Other Ambulatory Visit (INDEPENDENT_AMBULATORY_CARE_PROVIDER_SITE_OTHER): Payer: BLUE CROSS/BLUE SHIELD

## 2015-11-14 ENCOUNTER — Ambulatory Visit (INDEPENDENT_AMBULATORY_CARE_PROVIDER_SITE_OTHER)
Admission: RE | Admit: 2015-11-14 | Discharge: 2015-11-14 | Disposition: A | Discharge status: Home / Self Care | Payer: BLUE CROSS/BLUE SHIELD | Source: Ambulatory Visit | Attending: Internal Medicine | Admitting: Internal Medicine

## 2015-11-14 VITALS — BP 104/60 | HR 85 | Ht 63.0 in | Wt 162.0 lb

## 2015-11-14 DIAGNOSIS — R05 Cough: Secondary | ICD-10-CM

## 2015-11-14 DIAGNOSIS — R058 Other specified cough: Secondary | ICD-10-CM

## 2015-11-14 LAB — CBC WITH DIFFERENTIAL/PLATELET
BASOS PCT: 0.3 % (ref 0.0–3.0)
Basophils Absolute: 0 10*3/uL (ref 0.0–0.1)
Eosinophils Absolute: 0.2 10*3/uL (ref 0.0–0.7)
Eosinophils Relative: 2.5 % (ref 0.0–5.0)
HCT: 39.7 % (ref 36.0–46.0)
HEMOGLOBIN: 13.2 g/dL (ref 12.0–15.0)
Lymphocytes Relative: 31.1 % (ref 12.0–46.0)
Lymphs Abs: 2.9 10*3/uL (ref 0.7–4.0)
MCHC: 33.3 g/dL (ref 30.0–36.0)
MCV: 75.7 fl — ABNORMAL LOW (ref 78.0–100.0)
MONO ABS: 0.7 10*3/uL (ref 0.1–1.0)
Monocytes Relative: 7.7 % (ref 3.0–12.0)
NEUTROS ABS: 5.4 10*3/uL (ref 1.4–7.7)
Neutrophils Relative %: 58.4 % (ref 43.0–77.0)
PLATELETS: 350 10*3/uL (ref 150.0–400.0)
RBC: 5.25 Mil/uL — ABNORMAL HIGH (ref 3.87–5.11)
RDW: 14.2 % (ref 11.5–15.5)
WBC: 9.3 10*3/uL (ref 4.0–10.5)

## 2015-11-14 NOTE — Progress Notes (Signed)
Subjective:    Patient ID: Gina Johns, female    DOB: 05-16-1956,     MRN: [id number]    Brief patient profile:  60 yo [place] female ([place]) healthy there and here x 2007 until 2016 with cough/ wheeze that comes and goes so referred to pulmonary clinic 10/13/2015 by Dr Marlyn Corporal    10/13/2015 1st [facility] office visit/ Massey Ruhland   Chief Complaint  Patient presents with  . Pulmonary Consult    Referrede by Dr. Juanita Craver. Pt c/o cough x 6 months. Cough is prod at times with clear to beige sputum. She states cough is worse if she eats or drinks something cold. Neb txs have helped in the past, but she is unsure of what med.   waxes and wanes x 1.5 years, ? Related to cold water swallowing, does not wake her up  Better with neb x 4 days at most/  but not ventolin hfa Onset was while on Boniva and last took it ? Nov 2016  And not yet treated for reflux / never took prednisone for this cough to her knowledge. rec Pantoprazole (protonix) 40 mg   Take  30-60 min before first meal of the day and Pepcid (famotidine)  20 mg one @  bedtime until return to office - this is the best way to tell whether stomach acid is contributing to your problem.   GERD diet  Prednisone 10 mg take  4 each am x 2 days,   2 each am x 2 days,  1 each am x 2 days and stop  Only use your albuterol as a rescue medication  Please schedule a follow up office visit in 4 weeks, sooner if needed    11/14/2015  f/u ov/Parvin Stetzer re:  uacs on protonix in am / pepcid 20 mg at hs  Chief Complaint  Patient presents with  . Follow-up    Cough is unchanged. No new co's today.   cough hx is different from prev ov/ nyquil helps/ worse at bedtime but does not wake her up in the am  No obvious patterns in day to day or daytime variability or assoc excess/ purulent sputum or mucus plugs or cp or chest tightness, subjective wheeze or overt sinus or hb symptoms. No unusual exp hx or h/o childhood pna/ asthma  or knowledge of premature birth.   Also denies any obvious fluctuation of symptoms with weather or environmental changes or other aggravating or alleviating factors except as outlined above   Current Medications, Allergies, Complete Past Medical History, Past Surgical History, Family History, and Social History were reviewed in [employer] record.  ROS  The following are not active complaints unless bolded sore throat, dysphagia, dental problems, itching, sneezing,  nasal congestion or excess/ purulent secretions, ear ache,   fever, chills, sweats, unintended wt loss, classically pleuritic or exertional cp, hemoptysis,  orthopnea pnd or leg swelling, presyncope, palpitations, abdominal pain, anorexia, nausea, vomiting, diarrhea  or change in bowel or bladder habits, change in stools or urine, dysuria,hematuria,  rash, arthralgias, visual complaints, headache, numbness, weakness or ataxia or problems with walking or coordination,  change in mood/affect or memory.           Objective:   Physical Exam   amb [place] female nad pseudowheeze resolved    Wt Readings from Last 3 Encounters:  11/14/15 162 lb (73.483 kg)  10/13/15 159 lb 3.2 oz (72.213 kg)  04/26/15 156 lb (70.761 kg)  Vital signs reviewed      HEENT: nl dentition, turbinates, and oropharynx. Nl external ear canals without cough reflex   NECK :  without JVD/Nodes/TM/ nl carotid upstrokes bilaterally   LUNGS: no acc muscle use,  Nl contour chest with  Clear lungs bilaterally even with fvc   CV:  RRR  no s3 or murmur or increase in P2, no edema   ABD:  soft and nontender with nl inspiratory excursion in the supine position. No bruits or organomegaly, bowel sounds nl  MS:  Nl gait/ ext warm without deformities, calf tenderness, cyanosis or clubbing No obvious joint restrictions   SKIN: warm and dry without lesions    NEURO:  alert, approp, nl sensorium with  no motor deficits     CXR PA and  Lateral:   11/14/2015 :    I personally reviewed images and agree with radiology impression as follows:   No acute cardiopulmonary disease.   Labs ordered 11/14/2015 :  Cbc with diff/ allergy profile     Assessment & Plan:

## 2015-11-14 NOTE — Patient Instructions (Addendum)
Take the nyquil at bedtime consistently x 2 weeks since you feel it helped you  Continue Pantoprazole (protonix) 40 mg   Take  30-60 min before first meal of the day and Pepcid (famotidine)  20 mg one @  bedtime until return to office - this is the best way to tell whether stomach acid is contributing to your problem.    Please see patient coordinator before you leave today  to schedule Methacholine challenge test and I will call you with the result   Please remember to go to the lab and x-ray department downstairs for your tests - we will call you with the results when they are available.

## 2015-11-15 LAB — RESPIRATORY ALLERGY PROFILE REGION II ~~LOC~~
Allergen, Cedar tree, t12: <0.1 kU/L
Allergen, Comm Silver Birch, t9: <0.1 kU/L
Allergen, Cottonwood, t14: <0.1 kU/L
Allergen, Mouse Urine Protein, e78: <0.1 kU/L
Allergen, Oak,t7: <0.1 kU/L
Alternaria Alternata: <0.1 kU/L
Bermuda Grass: <0.1 kU/L
Box Elder IgE: <0.1 kU/L
Common Ragweed: <0.1 kU/L
Dog Dander: <0.1 kU/L
IgE (Immunoglobulin E), Serum: 12 kU/L (ref <115)
Penicillium Notatum: <0.1 kU/L

## 2015-11-15 NOTE — Progress Notes (Signed)
Quick Note:  Spoke with pt and notified of results per Dr. Wert. Pt verbalized understanding and denied any questions.  ______ 

## 2015-11-15 NOTE — Assessment & Plan Note (Addendum)
Spirometry 10/13/2015  wnl with active "wheeze" > resolved on max gerd rx   Note that her "wheezing" has resolved on exam and now the symptom is reported to be worse at hs and with eating /drinking cold items so this is strongly in favor of uacs and against asthma but can't exclude it completely and rec next step = MCT / allergy screening  Since she knows the nyquil works advised to use it consistently for 2 weeks to prove it which would be another factor against cough variant asthma  And in favor of uacs =  Classic Upper airway cough syndrome, so named because it's frequently impossible to sort out how much is  CR/sinusitis with freq throat clearing (which can be related to primary GERD)   vs  causing  secondary (" extra esophageal")  GERD from wide swings in gastric pressure that occur with throat clearing, often  promoting self use of mint and menthol lozenges that reduce the lower esophageal sphincter tone and exacerbate the problem further in a cyclical fashion.   These are the same pts (now being labeled as having "irritable larynx syndrome" by some cough centers) who not infrequently have a history of having failed to tolerate ace inhibitors,  dry powder inhalers or biphosphonates or report having atypical reflux symptoms that don't respond to standard doses of PPI , and are easily confused as having aecopd or asthma flares by even experienced allergists/ pulmonologists.   I had an extended discussion with the patient reviewing all relevant studies completed to date and  lasting 15 to 20 minutes of a 25 minute visit    Each maintenance medication was reviewed in detail including most importantly the difference between maintenance and prns and under what circumstances the prns are to be triggered using an action plan format that is not reflected in the computer generated alphabetically organized AVS.    Please see instructions for details which were reviewed in writing and the patient given a copy  highlighting the part that I personally wrote and discussed at today's ov.

## 2015-11-18 MED FILL — LETROZOLE 2.5 MG TABLET: 2.5 | 90 days supply | Qty: 90 | Fill #1

## 2015-11-23 ENCOUNTER — Encounter (HOSPITAL_COMMUNITY): Payer: Self-pay

## 2015-12-06 ENCOUNTER — Ambulatory Visit (HOSPITAL_COMMUNITY)
Admission: RE | Admit: 2015-12-06 | Discharge: 2015-12-06 | Disposition: A | Discharge status: Home / Self Care | Payer: BLUE CROSS/BLUE SHIELD | Source: Ambulatory Visit | Attending: Internal Medicine | Admitting: Internal Medicine

## 2015-12-06 DIAGNOSIS — R05 Cough: Secondary | ICD-10-CM

## 2015-12-06 DIAGNOSIS — R058 Other specified cough: Secondary | ICD-10-CM

## 2015-12-14 ENCOUNTER — Ambulatory Visit (HOSPITAL_COMMUNITY)
Admission: RE | Admit: 2015-12-14 | Discharge: 2015-12-14 | Disposition: A | Discharge status: Home / Self Care | Payer: BLUE CROSS/BLUE SHIELD | Source: Ambulatory Visit | Attending: Internal Medicine | Admitting: Internal Medicine

## 2015-12-14 DIAGNOSIS — R05 Cough: Secondary | ICD-10-CM | POA: Insufficient documentation

## 2015-12-14 LAB — PULMONARY FUNCTION TEST
FEF 25-75 Post: 1.43 L/sec
FEF 25-75 Pre: 1.88 L/sec
FEF2575-%CHANGE-POST: -24 %
FEF2575-%PRED-POST: 61 %
FEF2575-%Pred-Pre: 81 %
FEV1-%CHANGE-POST: -6 %
FEV1-%Pred-Post: 78 %
FEV1-%Pred-Pre: 84 %
FEV1-Post: 1.95 L
FEV1-Pre: 2.08 L
FEV1FVC-%Change-Post: 0 %
FEV1FVC-%PRED-PRE: 102 %
FEV6-%Change-Post: -5 %
FEV6-%Pred-Post: 80 %
FEV6-%Pred-Pre: 84 %
FEV6-PRE: 2.61 L
FEV6-Post: 2.47 L
FEV6FVC-%Pred-Post: 104 %
FEV6FVC-%Pred-Pre: 104 %
FVC-%CHANGE-POST: -5 %
FVC-%PRED-POST: 77 %
FVC-%PRED-PRE: 81 %
FVC-POST: 2.47 L
FVC-PRE: 2.61 L
POST FEV1/FVC RATIO: 79 %
PRE FEV6/FVC RATIO: 100 %
Post FEV6/FVC ratio: 100 %
Pre FEV1/FVC ratio: 80 %

## 2015-12-14 MED ORDER — METHACHOLINE 0.0625 MG/ML NEB SOLN
2.0000 mL | Freq: Once | RESPIRATORY_TRACT | Status: AC
  Administered 2015-12-14: 0.125 mg via RESPIRATORY_TRACT

## 2015-12-14 MED ORDER — METHACHOLINE 16 MG/ML NEB SOLN
2.0000 mL | Freq: Once | RESPIRATORY_TRACT | Status: AC
  Administered 2015-12-14: 32 mg via RESPIRATORY_TRACT

## 2015-12-14 MED ORDER — SODIUM CHLORIDE 0.9 % IN NEBU
3.0000 mL | INHALATION_SOLUTION | Freq: Once | RESPIRATORY_TRACT | Status: AC
  Administered 2015-12-14: 3 mL via RESPIRATORY_TRACT

## 2015-12-14 MED ORDER — METHACHOLINE 4 MG/ML NEB SOLN
2.0000 mL | Freq: Once | RESPIRATORY_TRACT | Status: AC
  Administered 2015-12-14: 8 mg via RESPIRATORY_TRACT

## 2015-12-14 MED ORDER — METHACHOLINE 0.25 MG/ML NEB SOLN
2.0000 mL | Freq: Once | RESPIRATORY_TRACT | Status: AC
  Administered 2015-12-14: 0.5 mg via RESPIRATORY_TRACT

## 2015-12-14 MED ORDER — ALBUTEROL SULFATE (2.5 MG/3ML) 0.083% IN NEBU
2.5000 mg | INHALATION_SOLUTION | Freq: Once | RESPIRATORY_TRACT | Status: AC
  Administered 2015-12-14: 2.5 mg via RESPIRATORY_TRACT

## 2015-12-14 MED ORDER — METHACHOLINE 1 MG/ML NEB SOLN
2.0000 mL | Freq: Once | RESPIRATORY_TRACT | Status: AC
  Administered 2015-12-14: 2 mg via RESPIRATORY_TRACT

## 2015-12-15 NOTE — Progress Notes (Signed)
Quick Note:  Spoke with pt and notified of results per Dr. Wert. Pt verbalized understanding and denied any questions.  ______ 

## 2016-01-23 DIAGNOSIS — E785 Hyperlipidemia, unspecified: Secondary | ICD-10-CM | POA: Diagnosis not present

## 2016-02-21 ENCOUNTER — Other Ambulatory Visit: Payer: Self-pay | Admitting: Family Medicine

## 2016-02-21 DIAGNOSIS — M8580 Other specified disorders of bone density and structure, unspecified site: Secondary | ICD-10-CM

## 2016-02-21 DIAGNOSIS — M25571 Pain in right ankle and joints of right foot: Secondary | ICD-10-CM | POA: Diagnosis not present

## 2016-02-21 DIAGNOSIS — Z1231 Encounter for screening mammogram for malignant neoplasm of breast: Secondary | ICD-10-CM

## 2016-02-21 DIAGNOSIS — C50411 Malignant neoplasm of upper-outer quadrant of right female breast: Secondary | ICD-10-CM | POA: Diagnosis not present

## 2016-02-21 DIAGNOSIS — Z9011 Acquired absence of right breast and nipple: Secondary | ICD-10-CM

## 2016-02-27 ENCOUNTER — Other Ambulatory Visit: Payer: Self-pay | Admitting: Oncology

## 2016-02-27 DIAGNOSIS — C50919 Malignant neoplasm of unspecified site of unspecified female breast: Secondary | ICD-10-CM

## 2016-03-05 ENCOUNTER — Ambulatory Visit
Admission: RE | Admit: 2016-03-05 | Discharge: 2016-03-05 | Disposition: A | Discharge status: Home / Self Care | Payer: BLUE CROSS/BLUE SHIELD | Source: Ambulatory Visit | Attending: Family Medicine | Admitting: Family Medicine

## 2016-03-05 DIAGNOSIS — Z1231 Encounter for screening mammogram for malignant neoplasm of breast: Secondary | ICD-10-CM | POA: Diagnosis not present

## 2016-03-05 DIAGNOSIS — M8580 Other specified disorders of bone density and structure, unspecified site: Secondary | ICD-10-CM

## 2016-03-05 DIAGNOSIS — M25571 Pain in right ankle and joints of right foot: Secondary | ICD-10-CM | POA: Diagnosis not present

## 2016-03-05 DIAGNOSIS — M8588 Other specified disorders of bone density and structure, other site: Secondary | ICD-10-CM | POA: Diagnosis not present

## 2016-03-05 DIAGNOSIS — Z9011 Acquired absence of right breast and nipple: Secondary | ICD-10-CM

## 2016-03-05 DIAGNOSIS — M19032 Primary osteoarthritis, left wrist: Secondary | ICD-10-CM | POA: Diagnosis not present

## 2016-03-05 DIAGNOSIS — Z780 Asymptomatic menopausal state: Secondary | ICD-10-CM | POA: Diagnosis not present

## 2016-03-08 ENCOUNTER — Ambulatory Visit: Payer: Self-pay

## 2016-03-08 ENCOUNTER — Other Ambulatory Visit: Payer: Self-pay

## 2016-03-13 ENCOUNTER — Ambulatory Visit: Payer: BLUE CROSS/BLUE SHIELD | Admitting: Sports Medicine

## 2016-03-13 DIAGNOSIS — M25532 Pain in left wrist: Secondary | ICD-10-CM | POA: Diagnosis not present

## 2016-03-13 DIAGNOSIS — M25561 Pain in right knee: Secondary | ICD-10-CM | POA: Diagnosis not present

## 2016-03-13 DIAGNOSIS — M25571 Pain in right ankle and joints of right foot: Secondary | ICD-10-CM | POA: Diagnosis not present

## 2016-03-15 DIAGNOSIS — M25571 Pain in right ankle and joints of right foot: Secondary | ICD-10-CM | POA: Diagnosis not present

## 2016-03-15 DIAGNOSIS — M25561 Pain in right knee: Secondary | ICD-10-CM | POA: Diagnosis not present

## 2016-03-15 DIAGNOSIS — M25532 Pain in left wrist: Secondary | ICD-10-CM | POA: Diagnosis not present

## 2016-03-15 MED FILL — LETROZOLE 2.5 MG TABLET: 2.5 | 90 days supply | Qty: 90 | Fill #2

## 2016-03-20 DIAGNOSIS — M25561 Pain in right knee: Secondary | ICD-10-CM | POA: Diagnosis not present

## 2016-03-20 DIAGNOSIS — M25571 Pain in right ankle and joints of right foot: Secondary | ICD-10-CM | POA: Diagnosis not present

## 2016-03-20 DIAGNOSIS — M25532 Pain in left wrist: Secondary | ICD-10-CM | POA: Diagnosis not present

## 2016-03-29 DIAGNOSIS — M25532 Pain in left wrist: Secondary | ICD-10-CM | POA: Diagnosis not present

## 2016-03-29 DIAGNOSIS — M25571 Pain in right ankle and joints of right foot: Secondary | ICD-10-CM | POA: Diagnosis not present

## 2016-03-29 DIAGNOSIS — M25561 Pain in right knee: Secondary | ICD-10-CM | POA: Diagnosis not present

## 2016-04-03 ENCOUNTER — Ambulatory Visit: Payer: BLUE CROSS/BLUE SHIELD | Admitting: Sports Medicine

## 2016-04-03 DIAGNOSIS — M25571 Pain in right ankle and joints of right foot: Secondary | ICD-10-CM | POA: Diagnosis not present

## 2016-04-03 DIAGNOSIS — M25532 Pain in left wrist: Secondary | ICD-10-CM | POA: Diagnosis not present

## 2016-04-03 DIAGNOSIS — M25561 Pain in right knee: Secondary | ICD-10-CM | POA: Diagnosis not present

## 2016-04-04 ENCOUNTER — Telehealth: Payer: Self-pay | Admitting: *Deleted

## 2016-04-04 NOTE — Telephone Encounter (Signed)
"  I have an appointment and need to know what is your address."  Information provided.

## 2016-04-05 ENCOUNTER — Ambulatory Visit (HOSPITAL_BASED_OUTPATIENT_CLINIC_OR_DEPARTMENT_OTHER): Payer: BLUE CROSS/BLUE SHIELD | Admitting: Oncology

## 2016-04-05 VITALS — BP 137/81 | HR 69 | Temp 97.5°F | Resp 18 | Ht 63.0 in | Wt 159.6 lb

## 2016-04-05 DIAGNOSIS — C50911 Malignant neoplasm of unspecified site of right female breast: Secondary | ICD-10-CM | POA: Diagnosis not present

## 2016-04-05 DIAGNOSIS — C50411 Malignant neoplasm of upper-outer quadrant of right female breast: Secondary | ICD-10-CM

## 2016-04-05 DIAGNOSIS — Z79811 Long term (current) use of aromatase inhibitors: Secondary | ICD-10-CM

## 2016-04-05 DIAGNOSIS — Z170 Estrogen receptor positive status [ER+]: Secondary | ICD-10-CM | POA: Diagnosis not present

## 2016-04-05 DIAGNOSIS — M8580 Other specified disorders of bone density and structure, unspecified site: Secondary | ICD-10-CM | POA: Insufficient documentation

## 2016-04-05 MED ORDER — VITAMIN D 1000 UNITS PO TABS: 1000.0000 [IU] | ORAL_TABLET | Freq: Every day | ORAL | 4 refills | Status: AC

## 2016-04-05 MED ORDER — LETROZOLE 2.5 MG PO TABS: 2.5000 mg | ORAL_TABLET | Freq: Every day | ORAL | 4 refills | Status: DC

## 2016-04-05 NOTE — Progress Notes (Signed)
[place]  [phone] [phone]) [phone]     ID: Dorann Vassell DOB: 1955/07/04  MR#: [id number]  [id number]  Patient Care Team: Stephens Shire, MD as PCP - General (Family Medicine) Marcy Panning, MD as Consulting Physician (Oncology) Marchia Bond, MD as Consulting Physician (Orthopedic Surgery) Tanda Rockers, MD as Consulting Physician (Pulmonary Disease) Chauncey Cruel, MD OTHER MD:  CHIEF COMPLAINT: estrogen estrogen receptor positive breast cancer  CURRENT TREATMENT: letrozole   BREAST CANCER HISTORY: Ms Isenbarger tells me she was diagnosed with right-sided breast cancer while living in [place], in June 2013. She had a mastectomy and axillary lymph node dissection. She tells me the tumor measured 1 cm and that all 16 axillary lymph nodes were clear. She then received 18 doses of radiation and was started on letrozole September 2013.  INTERVAL HISTORY: The patient was evaluated in the breast clinic10/05/2016, accompanied by her husband, Dr Harlin Heys is establishing herself in my service today.  REVIEW OF SYSTEMS: The patient continues on letrozole, with good tolerance. She has mild hot flashes. Vaginal dryness is not a major concern. The obtain the drug at approximately $5 per month. Aside from side effects of letrozole, she has some knee discomfort. She tells me this is being evaluated by Dr. Mardelle Matte and that he has referred her to physiotherapy. Sometimes when she exercises happily she can have mild lymphedema in the right upper extremity. She does have a compression sleeve which she wears at times. A detailed review of systems today was otherwise noncontributory  PAST MEDICAL HISTORY: Past Medical History:  Diagnosis Date  . Anxiety   . Arthritis   . Breast cancer ([place]) 12/07/2011   right breast 2x3 cm  . Depression    " sometimes"  . Hypercholesterolemia    PMH  . Right knee pain 08/26/2012    PAST SURGICAL HISTORY: Past Surgical  History:  Procedure Laterality Date  . BREAST LUMPECTOMY WITH AXILLARY LYMPH NODE BIOPSY    . CESAREAN SECTION     Hx: of times 2  . MASS EXCISION Right 02/25/2014   Procedure: EXCISION MASS RIGHT CHEST WALL;  Surgeon: Autumn Messing III, MD;  Location: [place];  Service: General;  Laterality: Right;  . TOTAL MASTECTOMY Right 03/20/2013   Procedure: TOTAL MASTECTOMY;  Surgeon: Merrie Roof, MD;  Location: [facility];  Service: General;  Laterality: Right;    FAMILY HISTORY Family History  Problem Relation Age of Onset  . Cancer Sister 67    acute leukemia  . Hypertension Sister   . Hypertension Mother   the patient's father died at the age of 21. Her mother died at age 19. The patient had one brother, 2 sisters. There is no history of breast or ovarian cancer in the family  GYNECOLOGIC HISTORY:  Patient's last menstrual period was 03/04/2013. Menarche age 42, first live birth age 84, the patient is [place] P2. He went through the change of life approximately age 6. She never took hormone replacement.  SOCIAL HISTORY:  Patient works as a Oceanographer. Her husband has a PhD in Engineer, production. He is retired from Printmaker at [employer]. Their children live in [place] and [place]. The patient has no grandchildren.    ADVANCED DIRECTIVES: not in place   HEALTH MAINTENANCE: Social History  Substance Use Topics  . Smoking status: Never Smoker  . Smokeless tobacco: Never Used  . Alcohol use No     Colonoscopy: 2016  PAP:  Bone density: September  2017   No Known Allergies  Current Outpatient Prescriptions  Medication Sig Dispense Refill  . letrozole (FEMARA) 2.5 MG tablet TAKE 1 TABLET BY MOUTH DAILY. 30 tablet 0  . cholecalciferol (VITAMIN D) 1000 units tablet Take 1 tablet (1,000 Units total) by mouth daily. 100 tablet 4   No current facility-administered medications for this visit.     OBJECTIVE: middle-aged [place] woman who appears well Vitals:   04/05/16 1642    BP: 137/81  Pulse: 69  Resp: 18  Temp: 97.5 F (36.4 C)     Body mass index is 28.27 kg/m.    ECOG FS:1 - Symptomatic but completely ambulatory  Ocular: Sclerae unicteric, pupils equal, round and reactive to light Ear-nose-throat: Oropharynx clear and moist Lymphatic: No cervical or supraclavicular adenopathy Lungs no rales or rhonchi, good excursion bilaterally Heart regular rate and rhythm, no murmur appreciated Abd soft, nontender, positive bowel sounds MSK no focal spinal tenderness, currently no apparent right upper extremity lymphedema Neuro: non-focal, well-oriented, appropriate affect Breasts: the right breast is status post mastectomy and radiation. There is no evidence of local recurrence. The right axilla is benign. The left breast is unremarkable.   LAB RESULTS:  CMP     Component Value Date/Time   NA 141 02/22/2014 1011   K 4.3 02/22/2014 1011   CL 107 05/12/2012 1441   CO2 23 02/22/2014 1011   GLUCOSE 96 02/22/2014 1011   GLUCOSE 114 (H) 05/12/2012 1441   BUN 11.9 02/22/2014 1011   CREATININE 0.9 02/22/2014 1011   CALCIUM 9.4 02/22/2014 1011   PROT 7.5 02/22/2014 1011   ALBUMIN 3.9 02/22/2014 1011   AST 14 02/22/2014 1011   ALT 17 02/22/2014 1011   ALKPHOS 69 02/22/2014 1011   BILITOT 0.60 02/22/2014 1011    INo results found for: SPEP, UPEP  Lab Results  Component Value Date   WBC 9.3 11/14/2015   NEUTROABS 5.4 11/14/2015   HGB 13.2 11/14/2015   HCT 39.7 11/14/2015   MCV 75.7 (L) 11/14/2015   PLT 350.0 11/14/2015      Chemistry      Component Value Date/Time   NA 141 02/22/2014 1011   K 4.3 02/22/2014 1011   CL 107 05/12/2012 1441   CO2 23 02/22/2014 1011   BUN 11.9 02/22/2014 1011   CREATININE 0.9 02/22/2014 1011      Component Value Date/Time   CALCIUM 9.4 02/22/2014 1011   ALKPHOS 69 02/22/2014 1011   AST 14 02/22/2014 1011   ALT 17 02/22/2014 1011   BILITOT 0.60 02/22/2014 1011       Lab Results  Component Value Date    LABCA2 26 05/12/2012    No components found for: [id number]  No results for input(s): INR in the last 168 hours.  Urinalysis No results found for: COLORURINE, APPEARANCEUR, LABSPEC, PHURINE, GLUCOSEU, HGBUR, BILIRUBINUR, KETONESUR, PROTEINUR, UROBILINOGEN, NITRITE, LEUKOCYTESUR   STUDIES: No results found.  ELIGIBLE FOR AVAILABLE RESEARCH PROTOCOL: no  ASSESSMENT: 60 y.o. [place] woman originally from [place], status post right mastectomy and a thorough lymph node dissection June 2013 p for aT1b pN0, stage IA invasive breast cancer  (a) status post adjuvant radiation completed September 2013 (18 fractions)  (b) on letrozole since September 2013 PLAN: We spent the better part of today's hour-long appointment discussing the biology of breast cancer in general, and the specifics of the patient's tumor in particular. I reviewed her history with her in detail and she understands her chance of cure with  local treatment only was high, probably in the 85% range  By taking anastrozole she will have improved her prognosis sufficiently so that after 5 years and that drug she very likely will have a less than 5% risk of recurrence.  We reviewed the mechanism of action of letrozole, as well as the possible side effects, toxicities and complications. I gave her a copy of her bone density which shows mild osteopenia. I do not recommend bisphosphonates, which she tolerated poorly, but I did suggest she start vitamin D supplementation and I placed that prescription in for her.  I also gave her a copy of her mammogram which shows her breasts are not dense and therefore mammography is especially sensitive.  I gave her information on several exercise programs that are free of charge She requested financial assistance for their water and other bills and I have sent a note to our social worker to contact the patient and see if she can be helpful.  The patient is interested in having a review of lymphedema  management and I was glad to placed that in for her. I suggested she could see me one last time in a year which will be a time when she will be ready to "graduate", but she tells me she would be more comfortable seeing me on an every six-month basis. Accordingly she will see me again in April of next year.  The patient has a good understanding of the overall plan. She agrees with it. She knows the goal of treatment in her case is cure. She will call with any problems that may develop before her next visit here.  Chauncey Cruel, MD   04/05/2016 6:06 PM Medical Oncology and Hematology [facility] [address] [place], [place] [address] Tel. [phone]    Fax. [phone]

## 2016-04-13 ENCOUNTER — Encounter: Payer: Self-pay | Admitting: *Deleted

## 2016-04-13 NOTE — Progress Notes (Signed)
[place] Work  Clinical Social Work was referred by Futures trader for assessment of psychosocial needs; specifically financial concerns in paying for housing costs and utility bills.  Clinical Social Worker attempted to contact patient to offer support and assess for needs.  CSW left voicemail for patient, shared CSW not familiar with resources once completed treatment.  CSW encouraged patient to contact Department of Social Services and return CSW call as needed.    Polo Riley, MSW, LCSW, OSW-C Clinical Social Worker [facility] [phone]

## 2016-04-17 ENCOUNTER — Ambulatory Visit: Payer: BLUE CROSS/BLUE SHIELD | Admitting: Sports Medicine

## 2016-05-03 DIAGNOSIS — K219 Gastro-esophageal reflux disease without esophagitis: Secondary | ICD-10-CM | POA: Diagnosis not present

## 2016-05-03 DIAGNOSIS — R05 Cough: Secondary | ICD-10-CM | POA: Diagnosis not present

## 2016-05-03 DIAGNOSIS — J4520 Mild intermittent asthma, uncomplicated: Secondary | ICD-10-CM | POA: Diagnosis not present

## 2016-05-10 DIAGNOSIS — M25532 Pain in left wrist: Secondary | ICD-10-CM | POA: Diagnosis not present

## 2016-05-10 DIAGNOSIS — M25561 Pain in right knee: Secondary | ICD-10-CM | POA: Diagnosis not present

## 2016-05-10 DIAGNOSIS — M25571 Pain in right ankle and joints of right foot: Secondary | ICD-10-CM | POA: Diagnosis not present

## 2016-05-15 DIAGNOSIS — J22 Unspecified acute lower respiratory infection: Secondary | ICD-10-CM | POA: Diagnosis not present

## 2016-06-26 MED FILL — LETROZOLE 2.5 MG TABLET: 2.5 | 30 days supply | Qty: 30 | Fill #0

## 2016-07-03 DIAGNOSIS — E782 Mixed hyperlipidemia: Secondary | ICD-10-CM | POA: Diagnosis not present

## 2016-07-03 DIAGNOSIS — I890 Lymphedema, not elsewhere classified: Secondary | ICD-10-CM | POA: Diagnosis not present

## 2016-07-05 DIAGNOSIS — E785 Hyperlipidemia, unspecified: Secondary | ICD-10-CM | POA: Diagnosis not present

## 2016-07-05 DIAGNOSIS — K219 Gastro-esophageal reflux disease without esophagitis: Secondary | ICD-10-CM | POA: Diagnosis not present

## 2016-07-05 DIAGNOSIS — C50411 Malignant neoplasm of upper-outer quadrant of right female breast: Secondary | ICD-10-CM | POA: Diagnosis not present

## 2016-07-05 DIAGNOSIS — R05 Cough: Secondary | ICD-10-CM | POA: Diagnosis not present

## 2016-07-13 DIAGNOSIS — S40021A Contusion of right upper arm, initial encounter: Secondary | ICD-10-CM | POA: Diagnosis not present

## 2016-07-13 DIAGNOSIS — W009XXA Unspecified fall due to ice and snow, initial encounter: Secondary | ICD-10-CM | POA: Diagnosis not present

## 2016-07-13 DIAGNOSIS — M79621 Pain in right upper arm: Secondary | ICD-10-CM | POA: Diagnosis not present

## 2016-07-13 DIAGNOSIS — S4991XA Unspecified injury of right shoulder and upper arm, initial encounter: Secondary | ICD-10-CM | POA: Diagnosis not present

## 2016-08-01 DIAGNOSIS — H524 Presbyopia: Secondary | ICD-10-CM | POA: Diagnosis not present

## 2016-08-01 DIAGNOSIS — H25013 Cortical age-related cataract, bilateral: Secondary | ICD-10-CM | POA: Diagnosis not present

## 2016-08-01 DIAGNOSIS — H40011 Open angle with borderline findings, low risk, right eye: Secondary | ICD-10-CM | POA: Diagnosis not present

## 2016-08-01 DIAGNOSIS — H40013 Open angle with borderline findings, low risk, bilateral: Secondary | ICD-10-CM | POA: Diagnosis not present

## 2016-08-01 DIAGNOSIS — H2513 Age-related nuclear cataract, bilateral: Secondary | ICD-10-CM | POA: Diagnosis not present

## 2016-08-01 DIAGNOSIS — H40012 Open angle with borderline findings, low risk, left eye: Secondary | ICD-10-CM | POA: Diagnosis not present

## 2016-08-01 DIAGNOSIS — H1013 Acute atopic conjunctivitis, bilateral: Secondary | ICD-10-CM | POA: Diagnosis not present

## 2016-08-10 MED FILL — LETROZOLE 2.5 MG TABLET: 2.5 | 90 days supply | Qty: 90 | Fill #0

## 2016-08-10 MED FILL — MELOXICAM 15 MG TABLET: 15 | 30 days supply | Qty: 30 | Fill #0

## 2016-09-04 DIAGNOSIS — C50811 Malignant neoplasm of overlapping sites of right female breast: Secondary | ICD-10-CM | POA: Diagnosis not present

## 2016-09-04 DIAGNOSIS — M6740 Ganglion, unspecified site: Secondary | ICD-10-CM | POA: Diagnosis not present

## 2016-09-04 DIAGNOSIS — Z170 Estrogen receptor positive status [ER+]: Secondary | ICD-10-CM | POA: Diagnosis not present

## 2016-09-04 DIAGNOSIS — K219 Gastro-esophageal reflux disease without esophagitis: Secondary | ICD-10-CM | POA: Diagnosis not present

## 2016-09-04 DIAGNOSIS — M791 Myalgia: Secondary | ICD-10-CM | POA: Diagnosis not present

## 2016-09-04 DIAGNOSIS — C50411 Malignant neoplasm of upper-outer quadrant of right female breast: Secondary | ICD-10-CM | POA: Diagnosis not present

## 2016-09-04 DIAGNOSIS — I890 Lymphedema, not elsewhere classified: Secondary | ICD-10-CM | POA: Diagnosis not present

## 2016-09-04 DIAGNOSIS — M2241 Chondromalacia patellae, right knee: Secondary | ICD-10-CM | POA: Diagnosis not present

## 2016-09-04 DIAGNOSIS — E785 Hyperlipidemia, unspecified: Secondary | ICD-10-CM | POA: Diagnosis not present

## 2016-09-04 DIAGNOSIS — M8580 Other specified disorders of bone density and structure, unspecified site: Secondary | ICD-10-CM | POA: Diagnosis not present

## 2016-09-04 MED FILL — MELOXICAM 15 MG TABLET: 15 | 90 days supply | Qty: 90 | Fill #0

## 2016-09-05 DIAGNOSIS — Z23 Encounter for immunization: Secondary | ICD-10-CM | POA: Diagnosis not present

## 2016-09-06 DIAGNOSIS — C50811 Malignant neoplasm of overlapping sites of right female breast: Secondary | ICD-10-CM | POA: Diagnosis not present

## 2016-09-12 ENCOUNTER — Telehealth: Payer: Self-pay | Admitting: Oncology

## 2016-09-12 NOTE — Telephone Encounter (Signed)
lvm to inform pt of r/s 4/12 appt to 5/3 at 2 pm per GM on PAL

## 2016-09-27 ENCOUNTER — Other Ambulatory Visit: Payer: Self-pay

## 2016-10-04 ENCOUNTER — Ambulatory Visit: Payer: Self-pay | Admitting: Oncology

## 2016-10-25 ENCOUNTER — Ambulatory Visit: Payer: Self-pay | Admitting: Oncology

## 2016-10-25 ENCOUNTER — Other Ambulatory Visit: Payer: Self-pay

## 2017-02-13 ENCOUNTER — Other Ambulatory Visit: Payer: Self-pay | Admitting: Oncology

## 2017-02-13 DIAGNOSIS — Z1231 Encounter for screening mammogram for malignant neoplasm of breast: Secondary | ICD-10-CM

## 2017-03-08 DIAGNOSIS — J4540 Moderate persistent asthma, uncomplicated: Secondary | ICD-10-CM | POA: Diagnosis not present

## 2017-05-15 ENCOUNTER — Other Ambulatory Visit: Payer: Self-pay | Admitting: Oncology

## 2017-05-15 ENCOUNTER — Ambulatory Visit
Admission: RE | Admit: 2017-05-15 | Discharge: 2017-05-15 | Disposition: A | Discharge status: Home / Self Care | Payer: No Typology Code available for payment source | Source: Ambulatory Visit | Attending: Oncology | Admitting: Oncology

## 2017-05-15 DIAGNOSIS — Z1231 Encounter for screening mammogram for malignant neoplasm of breast: Secondary | ICD-10-CM

## 2017-06-05 DIAGNOSIS — C50411 Malignant neoplasm of upper-outer quadrant of right female breast: Secondary | ICD-10-CM | POA: Diagnosis not present

## 2017-06-05 DIAGNOSIS — Z170 Estrogen receptor positive status [ER+]: Secondary | ICD-10-CM | POA: Diagnosis not present

## 2017-08-26 ENCOUNTER — Ambulatory Visit: Payer: Self-pay

## 2017-09-27 DIAGNOSIS — Z1389 Encounter for screening for other disorder: Secondary | ICD-10-CM | POA: Diagnosis not present

## 2017-09-27 DIAGNOSIS — M2550 Pain in unspecified joint: Secondary | ICD-10-CM | POA: Diagnosis not present

## 2017-09-27 DIAGNOSIS — R5383 Other fatigue: Secondary | ICD-10-CM | POA: Diagnosis not present

## 2017-09-27 DIAGNOSIS — L309 Dermatitis, unspecified: Secondary | ICD-10-CM | POA: Diagnosis not present

## 2017-09-27 DIAGNOSIS — J45909 Unspecified asthma, uncomplicated: Secondary | ICD-10-CM | POA: Diagnosis not present

## 2017-11-05 IMAGING — MG DIGITAL SCREENING UNILATERAL LEFT MAMMOGRAM WITH CAD
2 series · 2 of 2 positions shown · non-contrast
Comparison: Previous exam(s).

CLINICAL DATA: Screening.

EXAM:
DIGITAL SCREENING UNILATERAL LEFT MAMMOGRAM WITH CAD

[L CC]
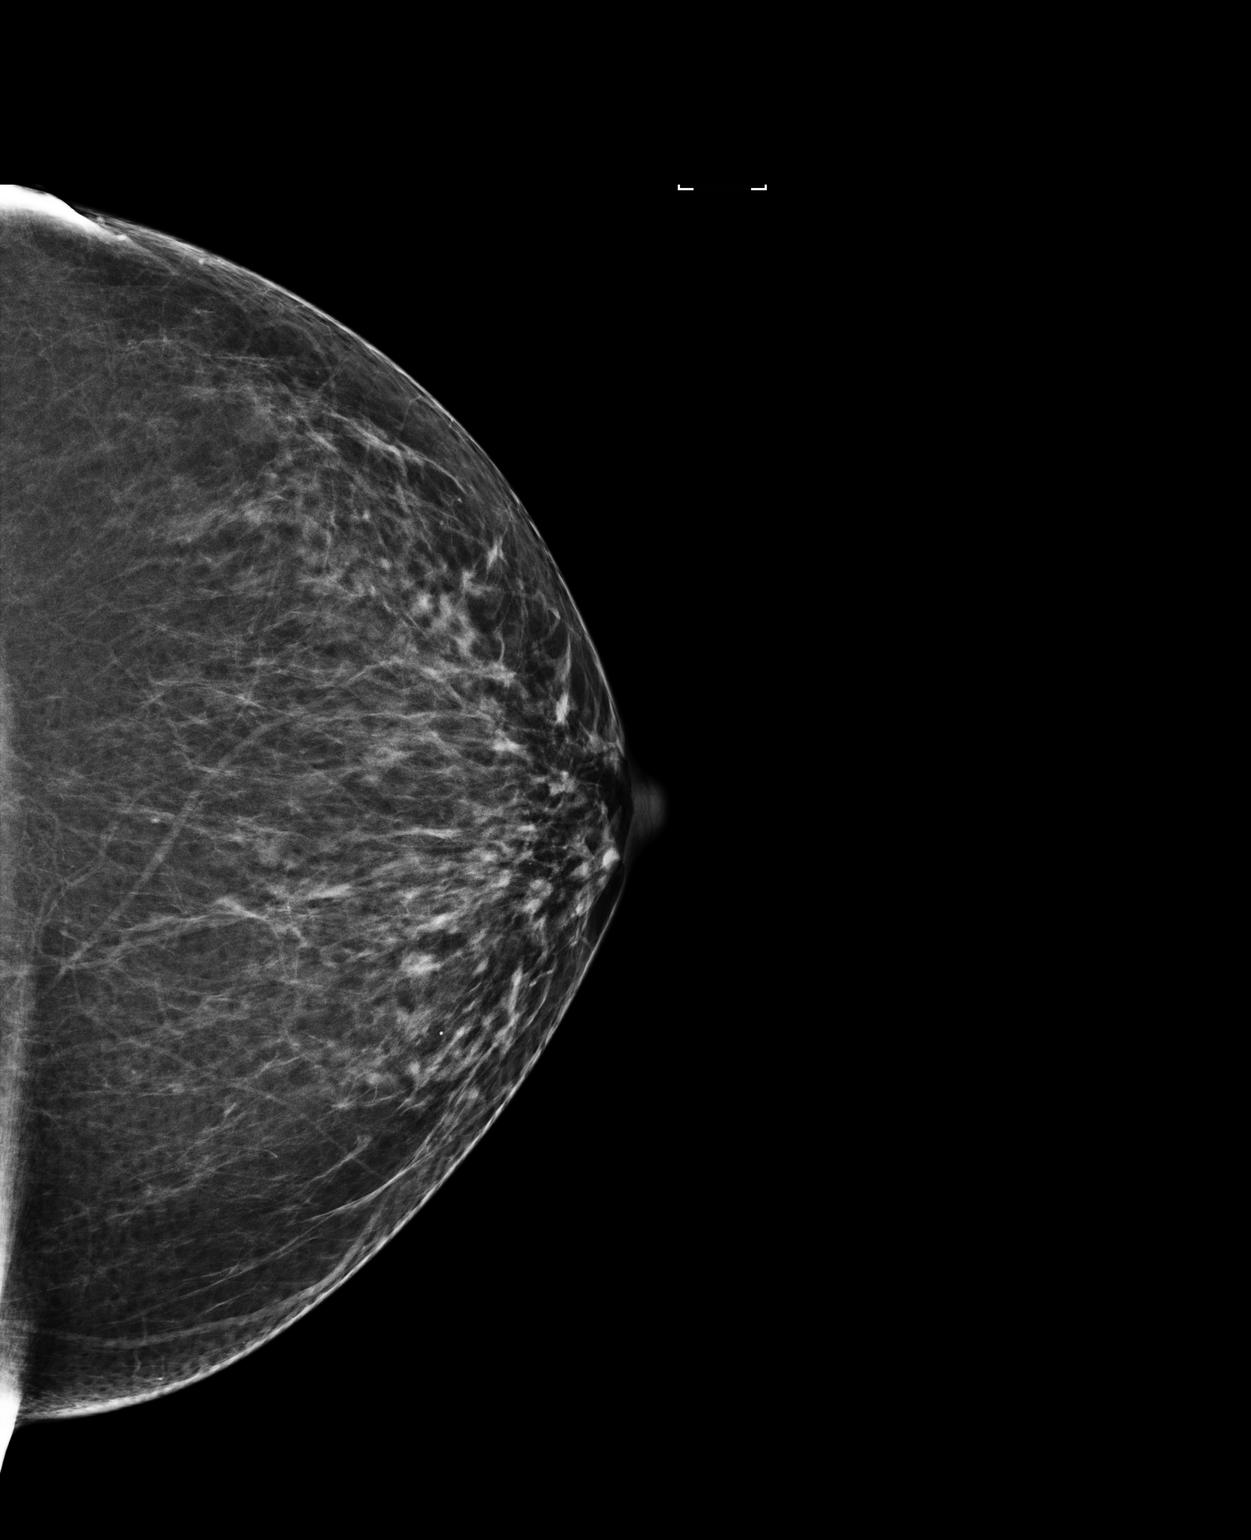

[L MLO]
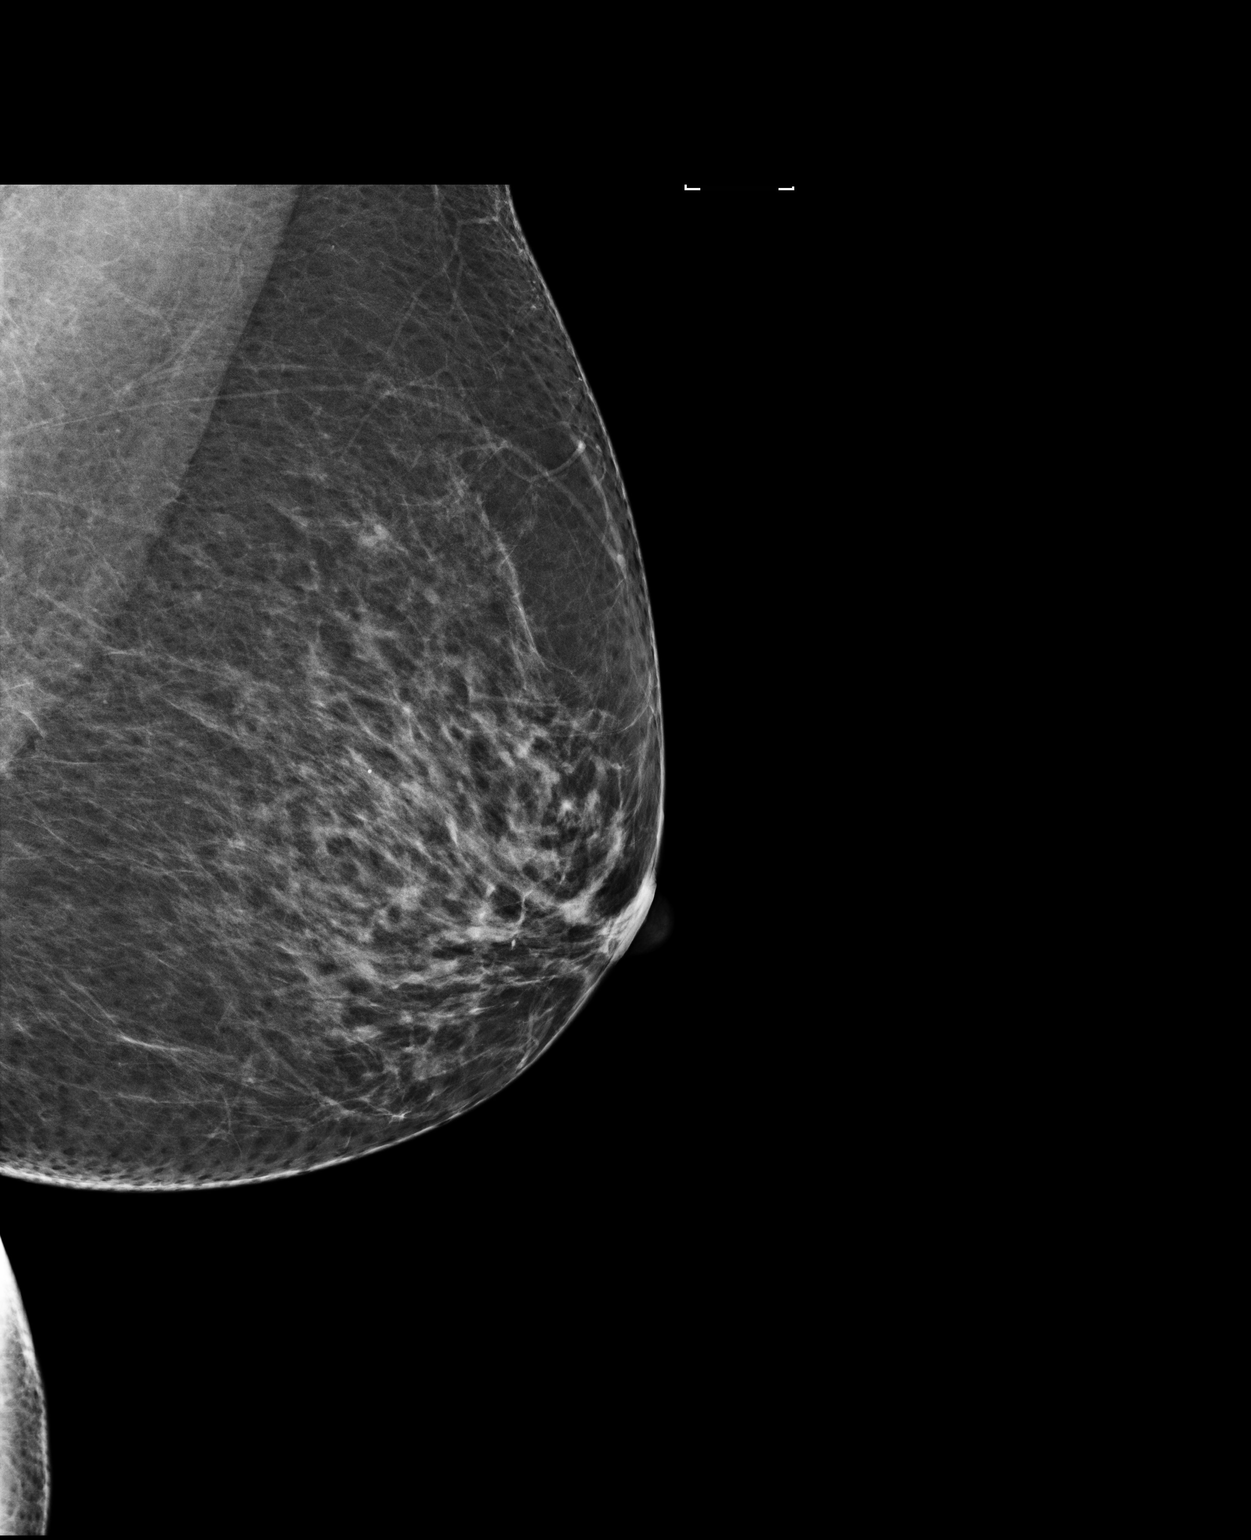

[2 of 2 positions shown; findings below may reference images not displayed]

ACR Breast Density Category b: There are scattered areas of
fibroglandular density.
FINDINGS: The patient has had a right mastectomy. There are no findings
suspicious for malignancy.

Images were processed with CAD.
IMPRESSION: No mammographic evidence of malignancy. A result letter of this
screening mammogram will be mailed directly to the patient.

RECOMMENDATION:
Screening mammogram in one year.  (Code:[id number])

BI-RADS CATEGORY  1: Negative.

## 2018-03-24 DIAGNOSIS — L439 Lichen planus, unspecified: Secondary | ICD-10-CM | POA: Diagnosis not present

## 2018-03-24 DIAGNOSIS — J028 Acute pharyngitis due to other specified organisms: Secondary | ICD-10-CM | POA: Diagnosis not present

## 2018-03-24 DIAGNOSIS — J45998 Other asthma: Secondary | ICD-10-CM | POA: Diagnosis not present

## 2018-03-24 DIAGNOSIS — R05 Cough: Secondary | ICD-10-CM | POA: Diagnosis not present

## 2018-04-18 ENCOUNTER — Other Ambulatory Visit: Payer: Self-pay | Admitting: Oncology

## 2018-04-18 DIAGNOSIS — Z1231 Encounter for screening mammogram for malignant neoplasm of breast: Secondary | ICD-10-CM

## 2018-04-21 DIAGNOSIS — M25561 Pain in right knee: Secondary | ICD-10-CM | POA: Diagnosis not present

## 2018-04-21 DIAGNOSIS — R05 Cough: Secondary | ICD-10-CM | POA: Diagnosis not present

## 2018-04-21 DIAGNOSIS — J189 Pneumonia, unspecified organism: Secondary | ICD-10-CM | POA: Diagnosis not present

## 2018-04-21 DIAGNOSIS — L439 Lichen planus, unspecified: Secondary | ICD-10-CM | POA: Diagnosis not present

## 2018-05-07 DIAGNOSIS — H2513 Age-related nuclear cataract, bilateral: Secondary | ICD-10-CM | POA: Diagnosis not present

## 2018-05-07 DIAGNOSIS — H25013 Cortical age-related cataract, bilateral: Secondary | ICD-10-CM | POA: Diagnosis not present

## 2018-05-07 DIAGNOSIS — H40013 Open angle with borderline findings, low risk, bilateral: Secondary | ICD-10-CM | POA: Diagnosis not present

## 2018-05-07 DIAGNOSIS — H01009 Unspecified blepharitis unspecified eye, unspecified eyelid: Secondary | ICD-10-CM | POA: Diagnosis not present

## 2018-05-07 DIAGNOSIS — H524 Presbyopia: Secondary | ICD-10-CM | POA: Diagnosis not present

## 2018-05-19 DIAGNOSIS — R413 Other amnesia: Secondary | ICD-10-CM | POA: Diagnosis not present

## 2018-05-19 DIAGNOSIS — L439 Lichen planus, unspecified: Secondary | ICD-10-CM | POA: Diagnosis not present

## 2018-05-19 DIAGNOSIS — J45909 Unspecified asthma, uncomplicated: Secondary | ICD-10-CM | POA: Diagnosis not present

## 2018-05-19 DIAGNOSIS — J189 Pneumonia, unspecified organism: Secondary | ICD-10-CM | POA: Diagnosis not present

## 2018-06-02 ENCOUNTER — Ambulatory Visit
Admission: RE | Admit: 2018-06-02 | Discharge: 2018-06-02 | Disposition: A | Discharge status: Home / Self Care | Payer: BLUE CROSS/BLUE SHIELD | Source: Ambulatory Visit | Attending: Oncology | Admitting: Oncology

## 2018-06-02 DIAGNOSIS — Z1231 Encounter for screening mammogram for malignant neoplasm of breast: Secondary | ICD-10-CM

## 2018-06-09 DIAGNOSIS — M791 Myalgia, unspecified site: Secondary | ICD-10-CM | POA: Diagnosis not present

## 2018-06-09 DIAGNOSIS — Z170 Estrogen receptor positive status [ER+]: Secondary | ICD-10-CM | POA: Diagnosis not present

## 2018-06-09 DIAGNOSIS — M2550 Pain in unspecified joint: Secondary | ICD-10-CM | POA: Diagnosis not present

## 2018-06-09 DIAGNOSIS — C50411 Malignant neoplasm of upper-outer quadrant of right female breast: Secondary | ICD-10-CM | POA: Diagnosis not present

## 2018-06-09 DIAGNOSIS — M109 Gout, unspecified: Secondary | ICD-10-CM | POA: Diagnosis not present

## 2018-06-09 DIAGNOSIS — I890 Lymphedema, not elsewhere classified: Secondary | ICD-10-CM | POA: Diagnosis not present

## 2018-06-09 DIAGNOSIS — C50911 Malignant neoplasm of unspecified site of right female breast: Secondary | ICD-10-CM | POA: Diagnosis not present

## 2018-06-10 ENCOUNTER — Telehealth: Payer: Self-pay | Admitting: *Deleted

## 2018-06-10 ENCOUNTER — Encounter: Payer: Self-pay | Admitting: Neurology

## 2018-06-10 ENCOUNTER — Other Ambulatory Visit: Payer: Self-pay

## 2018-06-10 ENCOUNTER — Telehealth: Payer: Self-pay | Admitting: Neurology

## 2018-06-10 ENCOUNTER — Ambulatory Visit: Payer: BLUE CROSS/BLUE SHIELD | Admitting: Neurology

## 2018-06-10 VITALS — BP 119/76 | HR 63 | Ht 63.0 in | Wt 162.5 lb

## 2018-06-10 DIAGNOSIS — R413 Other amnesia: Secondary | ICD-10-CM

## 2018-06-10 DIAGNOSIS — H01009 Unspecified blepharitis unspecified eye, unspecified eyelid: Secondary | ICD-10-CM | POA: Diagnosis not present

## 2018-06-10 DIAGNOSIS — F418 Other specified anxiety disorders: Secondary | ICD-10-CM | POA: Insufficient documentation

## 2018-06-10 DIAGNOSIS — R7989 Other specified abnormal findings of blood chemistry: Secondary | ICD-10-CM | POA: Diagnosis not present

## 2018-06-10 DIAGNOSIS — H40013 Open angle with borderline findings, low risk, bilateral: Secondary | ICD-10-CM | POA: Diagnosis not present

## 2018-06-10 DIAGNOSIS — H1013 Acute atopic conjunctivitis, bilateral: Secondary | ICD-10-CM | POA: Diagnosis not present

## 2018-06-10 MED ORDER — SERTRALINE HCL 50 MG PO TABS: ORAL_TABLET | ORAL | 11 refills | Status: DC

## 2018-06-10 NOTE — Patient Instructions (Signed)
A prescription for sertraline to help anxiety and mood has been sent to the [facility] will call you to schedule the MRI

## 2018-06-10 NOTE — Telephone Encounter (Signed)
BCBS Auth: [id number] (exp. 06/10/18 to 07/09/18) spoke to the patient she would like to think about it due to cost. I did offer her the payment plan.. she stated she would get back to me.

## 2018-06-10 NOTE — Progress Notes (Signed)
[facility]  PATIENT: Gina Johns DOB: 12-06-1955  REFERRING DOCTOR OR PCP:  Dr. Dagmar Hait SOURCE: patient, notes from PCP, lab results, MoCA  _________________________________   HISTORICAL  CHIEF COMPLAINT:  Chief Complaint  Patient presents with  . New Patient (Initial Visit)    RM 12, alone. Paper referral from Dr. Dagmar Hait for short term memory loss.   . Memory Loss    Today's MOCA     HISTORY OF PRESENT ILLNESS:  At the pleasure seeing your patient, Gina Johns, at [facility] for a neurologic consultation regarding her memory loss.  She is a 62 year old woman who began to note memory difficulty 2 years ago.   Changes have been gradual.   She notes the most difficulty finding items in her kitchen or hom and remembering conversations.  She loses her keys a lot.   She notes during a conversation she wants to mention something She notes calculations are fine on paper but she cannot do as well in her head.     She has had some errors remembering people's names.      She drives and has not gotten lost.  She feels focus and attention are worse now than they were while driving.   She feels focusing on conversations are more difficult.      She repots that other people also note difficulty with her memory.   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     She scored 21/30 on the MoCA test today but lost only one point for short term memory.   She lost 3 points for visuospatial/executive.   She also lost 2 points for naming and repetition and 2 points for language that may be related to [place] being a second later.      She notes anxiety and getting tense easily.  She notes mild depression.   She is very stressed about her memory.   She sleeps 5 hours most nights, worse when stressed.  She snores a little but husband has never reported pauses, snorts or gasps.  No FH of  dementia.   Her parents lived to his early 50's and memory was excellent until their late 78's.   No sibling with memory issues.    Recent lab work from [facility] shows normal CBC and chemistries.  There were no recent vitamin levels or thyroid test.  [facility] Cognitive Assessment  06/10/2018  Visuospatial/ Executive (0/5) 2  Naming (0/3) 1  Attention: Read list of digits (0/2) 2  Attention: Read list of letters (0/1) 1  Attention: Serial 7 subtraction starting at 100 (0/3) 3  Language: Repeat phrase (0/2) 0  Language : Fluency (0/1) 1  Abstraction (0/2) 1  Delayed Recall (0/5) 4  Orientation (0/6) 6  Total 21  Adjusted Score (based on education) 21     REVIEW OF SYSTEMS: Constitutional: No fevers, chills, sweats, or change in appetite Eyes: No visual changes, double vision, eye pain Ear, nose and throat: No hearing loss, ear pain, nasal congestion, sore throat Cardiovascular: No chest pain, palpitations Respiratory: No shortness of breath at rest or with exertion.   No wheezes GastrointestinaI: No nausea, vomiting, diarrhea, abdominal pain, fecal incontinence Genitourinary: No dysuria, urinary retention or frequency.  No nocturia. Musculoskeletal: No neck pain, back pain Integumentary: No rash, pruritus, skin lesions Neurological: as above Psychiatric: No depression at this time.  No  anxiety Endocrine: No palpitations, diaphoresis, change in appetite, change in weigh or increased thirst Hematologic/Lymphatic: No anemia, purpura, petechiae. Allergic/Immunologic: No itchy/runny eyes, nasal congestion, recent allergic reactions, rashes  ALLERGIES: No Known Allergies  HOME MEDICATIONS:  Current Outpatient Medications:  .  cholecalciferol (VITAMIN D) 1000 units tablet, Take 1 tablet (1,000 Units total) by mouth daily. (Patient taking differently: Take 1,000 Units by mouth every 30 (thirty) days. ), Disp: 100 tablet, Rfl: 4 .  Cyanocobalamin (B-12 PO), Take 1 Dose by mouth.,  Disp: , Rfl:   PAST MEDICAL HISTORY: Past Medical History:  Diagnosis Date  . Anxiety   . Arthritis   . Breast cancer ([place]) 12/07/2011   right breast 2x3 cm  . Depression    " sometimes"  . Hypercholesterolemia    PMH  . Right knee pain 08/26/2012    PAST SURGICAL HISTORY: Past Surgical History:  Procedure Laterality Date  . BREAST LUMPECTOMY WITH AXILLARY LYMPH NODE BIOPSY    . CESAREAN SECTION     Hx: of times 2  . MASS EXCISION Right 02/25/2014   Procedure: EXCISION MASS RIGHT CHEST WALL;  Surgeon: Autumn Messing III, MD;  Location: [facility];  Service: General;  Laterality: Right;  . MASTECTOMY Right   . TOTAL MASTECTOMY Right 03/20/2013   Procedure: TOTAL MASTECTOMY;  Surgeon: Merrie Roof, MD;  Location: [facility];  Service: General;  Laterality: Right;    FAMILY HISTORY: Family History  Problem Relation Age of Onset  . Cancer Sister 65       acute leukemia  . Hypertension Sister   . Hypertension Mother     SOCIAL HISTORY:  Social History   Socioeconomic History  . Marital status: Married    Spouse name: Not on file  . Number of children: 2  . Years of education: BS  . Highest education level: Not on file  Occupational History  . Not on file  Social Needs  . Financial resource strain: Not on file  . Food insecurity:    Worry: Not on file    Inability: Not on file  . Transportation needs:    Medical: Not on file    Non-medical: Not on file  Tobacco Use  . Smoking status: Never Smoker  . Smokeless tobacco: Never Used  Substance and Sexual Activity  . Alcohol use: No  . Drug use: No  . Sexual activity: Yes    Birth control/protection: None  Lifestyle  . Physical activity:    Days per week: Not on file    Minutes per session: Not on file  . Stress: Not on file  Relationships  . Social connections:    Talks on phone: Not on file    Gets together: Not on file    Attends religious service: Not on file    Active member of club or organization: Not on file     Attends meetings of clubs or organizations: Not on file    Relationship status: Not on file  . Intimate partner violence:    Fear of current or ex partner: Not on file    Emotionally abused: Not on file    Physically abused: Not on file    Forced sexual activity: Not on file  Other Topics Concern  . Not on file  Social History Narrative   Right handed    Caffeine use: daily   Lives with spouse.     PHYSICAL EXAM  Vitals:   06/10/18 1101  BP: 119/76  Pulse: 63  Weight: 162 lb 8 oz (73.7 kg)  Height: 5\' 3"  (1.6 m)    Body mass index is 28.79 kg/m.   General: The patient is well-developed and well-nourished and in no acute distress  Neck: The neck is supple, no carotid bruits are noted.  The neck is nontender.  Cardiovascular: The heart has a regular rate and rhythm with a normal S1 and S2. There were no murmurs, gallops or rubs.   Skin: Extremities are without significant edema.  Musculoskeletal:  Back is nontender  Neurologic Exam  Mental status: The patient is alert and oriented x 3 at the time of the examination.  She scored 21/30 on the [facility] cognitive assessment (see above).   Speech is normal.  Cranial nerves: Extraocular movements are full. Pupils are equal, round, and reactive to light and accomodation.    Facial symmetry is present. There is good facial sensation to soft touch bilaterally.Facial strength is normal.  Trapezius and sternocleidomastoid strength is normal. No dysarthria is noted.  The tongue is midline, and the patient has symmetric elevation of the soft palate. No obvious hearing deficits are noted.  Motor:  Muscle bulk is normal.   Tone is normal. Strength is  5 / 5 in all 4 extremities.   Sensory: Sensory testing is intact to touch and vibration in the arms.  She had slightly reduced vibration sensation in the toes but normal sensation at the ankles.  Coordination: Cerebellar testing reveals good finger-nose-finger and heel-to-shin  bilaterally.  Gait and station: Station is normal.   Gait is normal. Tandem gait is normal. Romberg is negative.   Reflexes: Deep tendon reflexes are symmetric and normal bilaterally.   Plantar responses are flexor.    DIAGNOSTIC DATA (LABS, IMAGING, TESTING) - I reviewed patient records, labs, notes, testing and imaging myself where available.  Lab Results  Component Value Date   WBC 9.3 11/14/2015   HGB 13.2 11/14/2015   HCT 39.7 11/14/2015   MCV 75.7 (L) 11/14/2015   PLT 350.0 11/14/2015      Component Value Date/Time   NA 141 02/22/2014 1011   K 4.3 02/22/2014 1011   CL 107 05/12/2012 1441   CO2 23 02/22/2014 1011   GLUCOSE 96 02/22/2014 1011   GLUCOSE 114 (H) 05/12/2012 1441   BUN 11.9 02/22/2014 1011   CREATININE 0.9 02/22/2014 1011   CALCIUM 9.4 02/22/2014 1011   PROT 7.5 02/22/2014 1011   ALBUMIN 3.9 02/22/2014 1011   AST 14 02/22/2014 1011   ALT 17 02/22/2014 1011   ALKPHOS 69 02/22/2014 1011   BILITOT 0.60 02/22/2014 1011   Lab Results  Component Value Date   CHOL 239 (H) 02/22/2014   HDL 43 02/22/2014   LDLCALC 150 (H) 02/22/2014   TRIG 228 (H) 02/22/2014   CHOLHDL 5.6 02/22/2014        ASSESSMENT AND PLAN  Memory loss - Plan: Thyroid Panel With TSH, Vitamin B12, VITAMIN D 25 Hydroxy (Vit-D Deficiency, Fractures), MR BRAIN WO CONTRAST  Depression with anxiety  Low serum vitamin D   In summary, Ms. Subramanian is a 62 year old woman who has noted progressive difficulty with memory over the last 2 years.  Today, she scored a 21/30 on the [facility] cognitive assessment though she might possibly have scored a couple points higher if English was not a second language.  She lost only 1 point for short-term memory.   Typically, with Alzheimer's, short-term memory is affected more rapidly on the [facility] cognitive assessment.  I discussed with her that I feel the likelihood that she has Alzheimer's is low..  More likely, I feel that her memory difficulties are  due to decreased focus and attention, possibly from her anxiety or poor sleep.  She has quite a bit of anxiety and I believe she would benefit from an SSRI as her also some moodiness implying mild depression.  I would avoid the benzodiazepines as she is reporting memory difficulties.  We will also check some blood work today (vitamin D, vitamin B12, TSH) and a noncontrasted MRI of the brain to make sure that she does not have a vitamin deficiency.   I will allow [place] to determine if she has any hippocampal atrophy, often seen very early in Alzheimer's.  This also allow [place] to rule out significant micorvascular changes or structural lesions that could also be playing a role.  If hippocampal atrophy is noted I would recommend starting donepezil.  If the lab work and MRI is normal, and she tolerates the SSRI, will monitor how she does when she returns in 2 months.  She also should call sooner if she has new or worsening neurologic symptoms.  Thank you for asking me to see Ms. Costin for neurologic consultation.  Please let me know if I can be of further assistance with her or other patients in the future.   Niyla Marone A. Felecia Shelling, MD, [facility] 28/36/6294, 76:54 AM Certified in Neurology, Clinical Neurophysiology, Sleep Medicine, Pain Medicine and Neuroimaging  [facility] [address], [place], [place] [address] [phone]

## 2018-06-10 NOTE — Telephone Encounter (Signed)
Pt completed labs and had further questions about rx Zoloft prescribed.  Advised she should take 1/2 tablet for the first three days and increase to 1 tablet daily after that. She is ok to take in the evening per Dr. Felecia Shelling. I wrote this on AVS for pt. Advised she will be called to get scheduled for MRI once authorization made via insurance. I wrote down my name for her as well. Nothing further needed.

## 2018-06-11 LAB — THYROID PANEL WITH TSH
FREE THYROXINE INDEX: 2.1 (ref 1.2–4.9)
T3 UPTAKE RATIO: 25 % (ref 24–39)
T4, Total: 8.4 ug/dL (ref 4.5–12.0)
TSH: 1.17 u[IU]/mL (ref 0.450–4.500)

## 2018-06-11 LAB — VITAMIN D 25 HYDROXY (VIT D DEFICIENCY, FRACTURES): VIT D 25 HYDROXY: 33.2 ng/mL (ref 30.0–100.0)

## 2018-06-11 LAB — VITAMIN B12: Vitamin B-12: 118 pg/mL — ABNORMAL LOW (ref 232–1245)

## 2018-06-12 ENCOUNTER — Telehealth: Payer: Self-pay | Admitting: *Deleted

## 2018-06-12 ENCOUNTER — Ambulatory Visit (INDEPENDENT_AMBULATORY_CARE_PROVIDER_SITE_OTHER): Payer: BLUE CROSS/BLUE SHIELD | Admitting: *Deleted

## 2018-06-12 DIAGNOSIS — E538 Deficiency of other specified B group vitamins: Secondary | ICD-10-CM

## 2018-06-12 MED ORDER — CYANOCOBALAMIN 1000 MCG/ML IJ SOLN
1000.0000 ug | INTRAMUSCULAR | Status: AC
  Administered 2018-06-12 – 2018-07-02 (×3): 1000 ug via INTRAMUSCULAR

## 2018-06-12 NOTE — Telephone Encounter (Signed)
Spoke with pt. and reviewed below lab results and rec. for Vit. B12 inj. once weekly for 4 wks, then monthly. She verbalized understanding of same, is agreeable with coming in for inj and will come in for first shot at 3:30pm today. Added to RN schedule/fim

## 2018-06-12 NOTE — Telephone Encounter (Signed)
Pt. has misunderstood instructions given this am.  She is coming in for a Vit. B12 inj. this afternoon.  I will review instructions again at that time, to ensure she understands.  Plan is for her to have a Vit. B12 inj. weekly for one month, then monthly after that, with the first inj. to be given today.  Will try to teach husband to give shots if he can accompany pt. to an appt.  Once Vit. B12 is normal, she may be able to go on oral Vit. B12/fim

## 2018-06-12 NOTE — Telephone Encounter (Signed)
-----   Message from Britt Bottom, MD sent at 06/11/2018  5:54 PM EST ----- Please let her know that the vitamin B12 was very low and we should do B12 shots to get the level back up.  Please have her come in to get the first shot and to be trained.  We can then send in B12 and syringes to her pharmacy

## 2018-06-12 NOTE — Telephone Encounter (Signed)
Pt called stating she would like her b12 tablets sent to [facility] for her husband to pick them up tomorrow

## 2018-06-17 DIAGNOSIS — Z23 Encounter for immunization: Secondary | ICD-10-CM | POA: Diagnosis not present

## 2018-06-19 ENCOUNTER — Ambulatory Visit: Payer: Self-pay

## 2018-06-19 ENCOUNTER — Ambulatory Visit (INDEPENDENT_AMBULATORY_CARE_PROVIDER_SITE_OTHER): Payer: BLUE CROSS/BLUE SHIELD

## 2018-06-19 DIAGNOSIS — E538 Deficiency of other specified B group vitamins: Secondary | ICD-10-CM

## 2018-06-19 NOTE — Progress Notes (Signed)
Patient presented to the clinic with husband to receive vitamin b-12 IM injection.  Patient requested to receive the injection in her left upper outer quadrant.  Site was cleaned with alcohol swab and injection given. Patient tolerated injection well and band aid placed over injection site.   Reviewed signs and symptoms of allergic reaction and patient left having no further questions.   MB RN

## 2018-06-26 ENCOUNTER — Ambulatory Visit (INDEPENDENT_AMBULATORY_CARE_PROVIDER_SITE_OTHER): Payer: BLUE CROSS/BLUE SHIELD | Admitting: *Deleted

## 2018-06-26 DIAGNOSIS — E538 Deficiency of other specified B group vitamins: Secondary | ICD-10-CM | POA: Diagnosis not present

## 2018-07-02 ENCOUNTER — Ambulatory Visit: Payer: BLUE CROSS/BLUE SHIELD

## 2018-07-02 ENCOUNTER — Ambulatory Visit (INDEPENDENT_AMBULATORY_CARE_PROVIDER_SITE_OTHER): Payer: BLUE CROSS/BLUE SHIELD | Admitting: *Deleted

## 2018-07-02 DIAGNOSIS — E538 Deficiency of other specified B group vitamins: Secondary | ICD-10-CM

## 2018-07-24 ENCOUNTER — Telehealth: Payer: Self-pay | Admitting: *Deleted

## 2018-07-24 MED ORDER — SERTRALINE HCL 50 MG PO TABS: ORAL_TABLET | ORAL | 1 refills | Status: DC

## 2018-07-24 NOTE — Telephone Encounter (Signed)
Called and spoke with pt. Advised her appt on 07/28/18 was scheduled in error. Dr. Felecia Shelling does not have appt at 4pm on Mondays. We will need to r/s that appt. She requested appt after 430pm. Advised we do not have appt at that time. She will call back to r/s that appt once she looks at her schedule. Advised I will cx that appt Monday.  She also wanted 90 day rx for zoloft sent to her pharmacy. I escribed this.

## 2018-07-28 ENCOUNTER — Ambulatory Visit: Payer: BLUE CROSS/BLUE SHIELD | Admitting: Neurology

## 2018-07-31 ENCOUNTER — Ambulatory Visit: Payer: BLUE CROSS/BLUE SHIELD

## 2018-08-12 ENCOUNTER — Ambulatory Visit: Payer: BLUE CROSS/BLUE SHIELD | Admitting: Diagnostic Neuroimaging

## 2019-02-24 DIAGNOSIS — M8580 Other specified disorders of bone density and structure, unspecified site: Secondary | ICD-10-CM | POA: Diagnosis not present

## 2019-02-24 DIAGNOSIS — E785 Hyperlipidemia, unspecified: Secondary | ICD-10-CM | POA: Diagnosis not present

## 2019-02-24 DIAGNOSIS — Z0000 Encounter for general adult medical examination without abnormal findings: Secondary | ICD-10-CM | POA: Diagnosis not present

## 2019-02-24 DIAGNOSIS — J069 Acute upper respiratory infection, unspecified: Secondary | ICD-10-CM | POA: Diagnosis not present

## 2019-03-03 DIAGNOSIS — J45909 Unspecified asthma, uncomplicated: Secondary | ICD-10-CM | POA: Diagnosis not present

## 2019-03-03 DIAGNOSIS — Z1331 Encounter for screening for depression: Secondary | ICD-10-CM | POA: Diagnosis not present

## 2019-03-03 DIAGNOSIS — E785 Hyperlipidemia, unspecified: Secondary | ICD-10-CM | POA: Diagnosis not present

## 2019-03-03 DIAGNOSIS — Z23 Encounter for immunization: Secondary | ICD-10-CM | POA: Diagnosis not present

## 2019-03-03 DIAGNOSIS — Z0000 Encounter for general adult medical examination without abnormal findings: Secondary | ICD-10-CM | POA: Diagnosis not present

## 2019-03-03 DIAGNOSIS — I890 Lymphedema, not elsewhere classified: Secondary | ICD-10-CM | POA: Diagnosis not present

## 2019-03-03 DIAGNOSIS — C50911 Malignant neoplasm of unspecified site of right female breast: Secondary | ICD-10-CM | POA: Diagnosis not present

## 2019-06-10 DIAGNOSIS — E785 Hyperlipidemia, unspecified: Secondary | ICD-10-CM | POA: Diagnosis not present

## 2019-07-29 ENCOUNTER — Ambulatory Visit: Payer: BLUE CROSS/BLUE SHIELD | Admitting: Internal Medicine

## 2020-08-25 DIAGNOSIS — Z20822 Contact with and (suspected) exposure to covid-19: Secondary | ICD-10-CM | POA: Diagnosis not present

## 2021-05-17 DIAGNOSIS — Z23 Encounter for immunization: Secondary | ICD-10-CM | POA: Diagnosis not present

## 2021-05-31 ENCOUNTER — Other Ambulatory Visit: Payer: Self-pay | Admitting: Internal Medicine

## 2021-05-31 DIAGNOSIS — Z1231 Encounter for screening mammogram for malignant neoplasm of breast: Secondary | ICD-10-CM

## 2021-06-13 DIAGNOSIS — Z20822 Contact with and (suspected) exposure to covid-19: Secondary | ICD-10-CM | POA: Diagnosis not present

## 2021-06-13 DIAGNOSIS — Z03818 Encounter for observation for suspected exposure to other biological agents ruled out: Secondary | ICD-10-CM | POA: Diagnosis not present

## 2021-06-16 DIAGNOSIS — R051 Acute cough: Secondary | ICD-10-CM | POA: Diagnosis not present

## 2021-06-16 DIAGNOSIS — R062 Wheezing: Secondary | ICD-10-CM | POA: Diagnosis not present

## 2021-06-16 DIAGNOSIS — J069 Acute upper respiratory infection, unspecified: Secondary | ICD-10-CM | POA: Diagnosis not present

## 2021-07-06 ENCOUNTER — Other Ambulatory Visit: Payer: Self-pay

## 2021-07-06 ENCOUNTER — Ambulatory Visit
Admission: RE | Admit: 2021-07-06 | Discharge: 2021-07-06 | Disposition: A | Discharge status: Home / Self Care | Payer: Medicare Other | Source: Ambulatory Visit | Attending: Internal Medicine | Admitting: Internal Medicine

## 2021-07-06 ENCOUNTER — Ambulatory Visit: Payer: BLUE CROSS/BLUE SHIELD

## 2021-07-06 DIAGNOSIS — Z1231 Encounter for screening mammogram for malignant neoplasm of breast: Secondary | ICD-10-CM

## 2021-08-31 DIAGNOSIS — W19XXXA Unspecified fall, initial encounter: Secondary | ICD-10-CM | POA: Diagnosis not present

## 2021-08-31 DIAGNOSIS — M545 Low back pain, unspecified: Secondary | ICD-10-CM | POA: Diagnosis not present

## 2021-09-05 ENCOUNTER — Ambulatory Visit: Payer: Medicare Other | Admitting: Sports Medicine

## 2021-09-12 ENCOUNTER — Ambulatory Visit: Payer: Medicare Other | Admitting: Sports Medicine

## 2021-09-12 VITALS — BP 114/72 | Ht 64.0 in | Wt 156.0 lb

## 2021-09-12 DIAGNOSIS — S39012A Strain of muscle, fascia and tendon of lower back, initial encounter: Secondary | ICD-10-CM | POA: Diagnosis not present

## 2021-09-12 NOTE — Patient Instructions (Signed)
It was great to see you today! ? ?-For your back pain we will refer you to physical therapy. They will call you to schedule an appt. ?-Please let us know if you don't hear from them in a few days. ?-Follow up with [place] 3 weeks after you have started physical therapy. ?-Call your insurance company to ask about possible cost of MRI which may be considered if you aren't improving. The CPT or procedure code they will ask for is 72148. We usually order this to be done at [facility] but if your insurance company has a different location they prefer we can order it there instead. ? ?Please call with any questions.  ?

## 2021-09-13 NOTE — Progress Notes (Signed)
? ?  Subjective:  ? ? Patient ID: Gina Johns, female    DOB: 04-09-1956, 66 y.o.   MRN: [id number] ? ?HPI chief complaint: Left-sided low back pain ? ?Very pleasant 66 year old female comes in today complaining of left-sided low back pain that began 19 days ago after she fell directly onto her left side.  She was seen shortly afterwards at her primary care office.  X-rays were obtained of her lumbar spine which are available for my review.  She was placed on methocarbamol but that has not been helpful.  She has also tried Tylenol but that too has been ineffective.  She localizes her pain diffusely along the left side of her low back and posterior hip.  She denies any groin pain.  She has symptoms with prolonged sitting and with prolonged walking.  Also some pain at night when turning over in bed.  Hot water has been helpful.  Overall, she does feel like she is improving albeit slowly.  Denies any radiating pain into her legs.  No associated numbness or tingling. ? ?Past medical history reviewed ?Medications reviewed ?Allergies reviewed ? ? ? ?Review of Systems ?As above ?   ?Objective:  ? Physical Exam ? ?Well-developed, well-nourished.  No acute distress.  Sitting comfortably in the exam room ? ?Lumbar spine: Good forward flexion and extension.  There is some tenderness to palpation to the left of the lumbar midline.  No tenderness to palpation or percussion along the spinous processes.  No spasm.  Examination of the left hip shows smooth painless hip range of motion with a negative logroll.  Neurological exam shows no focal neurological deficit of either lower extremity. ? ?X-rays of her lumbar spine including AP and lateral views dated March 9 are reviewed.  Nothing acute is seen.  She does have mild degenerative changes as well as a very small spondylolisthesis of L4 on L5. ? ? ?   ?Assessment & Plan:  ? ?Left-sided low back pain likely secondary to contusion/strain ? ?Reassurance regarding x-rays.  I  discussed further work-up in the form of an MRI versus trying physical therapy first.  She would like to try a course of physical therapy which I think is certainly reasonable.  She will follow-up with me 3 weeks after starting PT.  If she does not notice continued improvement, then we will reconsider MRI.  She will call with questions or concerns in the interim. ? ?This note was dictated using Dragon naturally speaking software and may contain errors in syntax, spelling, or content which have not been identified prior to signing this note.  ?

## 2021-09-15 NOTE — Therapy (Deleted)
?OUTPATIENT PHYSICAL THERAPY THORACOLUMBAR EVALUATION ? ? ?Patient Name: Gina Johns ?MRN: [id number] ?DOB:[birth year], 66 y.o., female ?Today's Date: 09/15/2021 ? ? ? ?Past Medical History:  ?Diagnosis Date  ? Anxiety   ? Arthritis   ? Breast cancer ([place]) 12/07/2011  ? right breast 2x3 cm  ? Depression   ? " sometimes"  ? Hypercholesterolemia   ? PMH  ? Right knee pain 08/26/2012  ? ?Past Surgical History:  ?Procedure Laterality Date  ? BREAST LUMPECTOMY WITH AXILLARY LYMPH NODE BIOPSY    ? CESAREAN SECTION    ? Hx: of times 2  ? MASS EXCISION Right 02/25/2014  ? Procedure: EXCISION MASS RIGHT CHEST WALL;  Surgeon: Autumn Messing III, MD;  Location: [place];  Service: General;  Laterality: Right;  ? MASTECTOMY Right   ? TOTAL MASTECTOMY Right 03/20/2013  ? Procedure: TOTAL MASTECTOMY;  Surgeon: Merrie Roof, MD;  Location: [place];  Service: General;  Laterality: Right;  ? ?Patient Active Problem List  ? Diagnosis Date Noted  ? Memory loss 06/10/2018  ? Depression with anxiety 06/10/2018  ? Low serum vitamin D 06/10/2018  ? Osteopenia determined by x-ray 04/05/2016  ? Upper airway cough syndrome 10/13/2015  ? Osteopenia 02/22/2014  ? 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 Lymphedema 10/19/2013  ? Breast cancer of upper-outer quadrant of right female breast ([place]) 03/06/2013  ? Dyslipidemia 01/22/2013  ? Right knee pain 08/26/2012  ? ? ?PCP: Prince Solian, MD ? ?REFERRING PROVIDER: Thurman Coyer, DO ? ?THERAPY DIAG:  ?No diagnosis found. ? ?REFERRING DIAG: Referral diagnosis: Strain of lumbar region, initial encounter [S39.012A] ? ?SUBJECTIVE: ? ?PERTINENT PAST HISTORY:  ?Osteopenia, spondylolisthesis, hx of cancer    ?     ?PRECAUTIONS: Osteopenia, spondylolisthesis, hx of cancer   ? ?WEIGHT BEARING RESTRICTIONS No ? ?FALLS:  ?Has patient fallen in last 6 months? {yes/no:20286}, Number of falls: *** ? ?LIVING ENVIRONMENT: ?Lives with: {[facility] lives with:25569::"lives with their  family"} ?Stairs: {yes/no:20286}; {Stairs:24000} ? ?MOI/History of condition: ? ?Onset date: *** ? ?Gina Johns is a 66 y.o. female who presents to clinic with chief complaint of ***.  *** ? ?From referring provider:  ? ?"Very pleasant 66 year old female comes in today complaining of left-sided low back pain that began 19 days ago after she fell directly onto her left side.  She was seen shortly afterwards at her primary care office.  X-rays were obtained of her lumbar spine which are available for my review.  She was placed on methocarbamol but that has not been helpful.  She has also tried Tylenol but that too has been ineffective.  She localizes her pain diffusely along the left side of her low back and posterior hip.  She denies any groin pain.  She has symptoms with prolonged sitting and with prolonged walking.  Also some pain at night when turning over in bed.  Hot water has been helpful.  Overall, she does feel like she is improving albeit slowly.  Denies any radiating pain into her legs.  No associated numbness or tingling." ? ? Red flags:  ?{has/denies:26543} {kerredflag:26542} ? ?Pain:  ?Are you having pain? {yes/no:20286} ?Pain location: *** ?NPRS scale:  ?current {NUMBERS; 0-10:5044}/10  ?average {NUMBERS; 0-10:5044}/10  ?Aggravating factors: *** ? NPRS, highest: {NUMBERS; 0-10:5044}/10 ?Relieving factors: *** ? NPRS: best: {NUMBERS; 0-10:5044}/10 ?Pain description: {PAIN DESCRIPTION:21022940} ?Stage: {Desc; acute/subacute/chronic:13799} ?Stability: {kerbetterworse:26715} ?24 hour pattern: ***  ? ?Occupation: *** ? ?Assistive Device: *** ? ?Hand  Dominance: *** ? ?Patient Goals/Specific Activities: *** ? ? ?PLOF: {PLOF:24004} ? ?DIAGNOSTIC FINDINGS: *** ? ? ?OBJECTIVE:  ? ?GENERAL OBSERVATION/GAIT: ? *** ? ?SENSATION: ? Light touch: {intact/deficits:24005} ? ?MUSCLE LENGTH: ?Hamstrings: Right {kerminsig:27227} restriction; Left {kerminsig:27227} restriction ?ASLR: Right {keraslr:27228}; Left  {keraslr:27228} ?Thomas test: Right {kerminsig:27227} restriction; Left {kerminsig:27227} restriction ?Ely's test: Right {kerminsig:27227} restriction; Left {kerminsig:27227} restriction ? ? ? ?LUMBAR AROM ? ?AROM AROM  ?09/15/2021  ?Flexion {kerromcxlx:26716}  ?Extension {kerromcxlx:26716}  ?Right lateral flexion {kerromcxlx:26716}  ?Left lateral flexion {kerromcxlx:26716}  ?Right rotation {kerromcxlx:26716}  ?Left rotation {kerromcxlx:26716}  ?  ?(Blank rows = not tested) ? ?DIRECTIONAL PREFERENCE: ? *** ? ?LE MMT: ? ?MMT Right ?09/15/2021 Left ?09/15/2021  ?Hip flexion (L2, L3)    ?Knee extension (L3)    ?Knee flexion    ?Hip abduction    ?Hip extension    ?Hip external rotation    ?Hip internal rotation    ?Hip adduction    ?Ankle dorsiflexion (L4)    ?Ankle plantarflexion (S1)    ?Ankle inversion    ?Ankle eversion    ?Great Toe ext (L5)    ?Grossly    ? ?(Blank rows = not tested, score listed is out of 5 possible points.  N = WNL, D = diminished, C = clear for gross weakness with myotome testing, * = concordant pain with testing) ? ? ?LE ROM: ? ?ROM Right ?09/15/2021 Left ?09/15/2021  ?Hip flexion    ?Hip extension    ?Hip abduction    ?Hip adduction    ?Hip internal rotation    ?Hip external rotation    ?Knee flexion    ?Knee extension    ?Ankle dorsiflexion    ?Ankle plantarflexion    ?Ankle inversion    ?Ankle eversion    ? ? (Blank rows = not tested, N = WNL, * = concordant pain with testing) ? ?Functional Tests ? ?Eval (09/15/2021)    ?    ?    ?    ?    ?    ?    ?    ?    ?    ?    ?    ?    ?    ?    ? ? ? LUMBAR SPECIAL TESTS:  ?Straight leg raise: L (***), R (***) ?Slump: L (***), R (***) ? ? PALPATION:  ? *** ? ? SPINAL SEGMENTAL MOBILITY ASSESSMENT:  ?*** ? ?PATIENT SURVEYS:  ?{rehab surveys:24030} ? ? ?TODAY'S TREATMENT  ?*** ? ?PATIENT EDUCATION:  ?POC, diagnosis, prognosis, HEP, and outcome measures.  Pt educated via explanation, demonstration, and handout (HEP).  Pt confirms understanding verbally.   ? ?ASTERISK SIGNS ? ? ?Asterisk Signs Eval (09/15/2021)       ?        ?        ?        ?        ?        ? ? ?HOME EXERCISE PROGRAM: ?*** ? ?ASSESSMENT: ? ?CLINICAL IMPRESSION: ?Serena is a 66 y.o. female who presents to clinic with signs and sxs consistent with ***.   ? ?OBJECTIVE IMPAIRMENTS: Pain, *** ? ?ACTIVITY LIMITATIONS: *** ? ?PERSONAL FACTORS: See medical history and pertinent history ? ? ?REHAB POTENTIAL: {rehabpotential:25112} ? ?CLINICAL DECISION MAKING: {clinical decision making:25114} ? ?EVALUATION COMPLEXITY: {Evaluation complexity:25115} ? ? ?GOALS: ? ? ?SHORT TERM GOALS: ? ?Caressa will be >75% HEP compliant to improve carryover between sessions and  facilitate independent management of condition ? ?Evaluation (09/15/2021): ongoing ?Target date: 11/10/2021 ?Goal status: INITIAL ? ? ?LONG TERM GOALS: ? ?*** ? ? ?2.  *** ? ? ?3.  *** ? ? ?4.  *** ? ? ?5.  *** ? ? ?6.  *** ? ? ?PLAN: ?PT FREQUENCY: 1-2x/week ? ?PT DURATION: 8 weeks (Ending 11/10/2021) ? ?PLANNED INTERVENTIONS: Therapeutic exercises, Aquatic therapy, Therapeutic activity, Neuro Muscular re-education, Gait training, Patient/Family education, Joint mobilization, Dry Needling, Electrical stimulation, Spinal mobilization and/or manipulation, Moist heat, Taping, Vasopneumatic device, Ionotophoresis '4mg'$ /ml Dexamethasone, and Manual therapy ? ?PLAN FOR NEXT SESSION: *** ? ? ?Shearon Balo PT, DPT ?09/15/2021, 8:50 AM ? ?

## 2021-09-20 ENCOUNTER — Ambulatory Visit: Payer: Medicare Other | Admitting: Physical Therapy

## 2021-09-26 ENCOUNTER — Ambulatory Visit: Payer: Medicare Other | Attending: Sports Medicine | Admitting: Physical Therapy

## 2021-09-26 DIAGNOSIS — R2681 Unsteadiness on feet: Secondary | ICD-10-CM | POA: Diagnosis not present

## 2021-09-26 DIAGNOSIS — M6281 Muscle weakness (generalized): Secondary | ICD-10-CM | POA: Insufficient documentation

## 2021-09-26 DIAGNOSIS — M545 Low back pain, unspecified: Secondary | ICD-10-CM | POA: Diagnosis not present

## 2021-09-26 DIAGNOSIS — S39012A Strain of muscle, fascia and tendon of lower back, initial encounter: Secondary | ICD-10-CM | POA: Insufficient documentation

## 2021-09-26 NOTE — Therapy (Signed)
?OUTPATIENT PHYSICAL THERAPY THORACOLUMBAR EVALUATION ? ? ?Patient Name: Gina Johns ?MRN: [id number] ?DOB:1955/12/24, 66 y.o., female ?Today's Date: 09/27/2021 ? ? PT End of Session - 09/27/21 1139   ? ? Visit Number 1   ? Date for PT Re-Evaluation 11/22/21   ? Authorization Type BCBS - MCR - PN on visit 10   ? Progress Note Due on Visit 10   ? PT Start Time 1700   ? PT Stop Time 9211   ? PT Time Calculation (min) 43 min   ? ?  ?  ? ?  ? ? ?Past Medical History:  ?Diagnosis Date  ? Anxiety   ? Arthritis   ? Breast cancer ([place]) 12/07/2011  ? right breast 2x3 cm  ? Depression   ? " sometimes"  ? Hypercholesterolemia   ? PMH  ? Right knee pain 08/26/2012  ? ?Past Surgical History:  ?Procedure Laterality Date  ? BREAST LUMPECTOMY WITH AXILLARY LYMPH NODE BIOPSY    ? CESAREAN SECTION    ? Hx: of times 2  ? MASS EXCISION Right 02/25/2014  ? Procedure: EXCISION MASS RIGHT CHEST WALL;  Surgeon: Autumn Messing III, MD;  Location: [place];  Service: General;  Laterality: Right;  ? MASTECTOMY Right   ? TOTAL MASTECTOMY Right 03/20/2013  ? Procedure: TOTAL MASTECTOMY;  Surgeon: Merrie Roof, MD;  Location: [place];  Service: General;  Laterality: Right;  ? ?Patient Active Problem List  ? Diagnosis Date Noted  ? Memory loss 06/10/2018  ? Depression with anxiety 06/10/2018  ? Low serum vitamin D 06/10/2018  ? Osteopenia determined by x-ray 04/05/2016  ? Upper airway cough syndrome 10/13/2015  ? Osteopenia 02/22/2014  ? 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 Lymphedema 10/19/2013  ? Breast cancer of upper-outer quadrant of right female breast ([place]) 03/06/2013  ? Dyslipidemia 01/22/2013  ? Right knee pain 08/26/2012  ? ? ?PCP: Prince Solian, MD ? ?REFERRING PROVIDER: Thurman Coyer, DO ? ?THERAPY DIAG:  ?Low back pain without sciatica, unspecified back pain laterality, unspecified chronicity - Plan: PT plan of care cert/re-cert ? ?Muscle weakness - Plan: PT plan of care cert/re-cert ? ?Unsteadiness  on feet - Plan: PT plan of care cert/re-cert ? ?REFERRING DIAG: Referral diagnosis: Strain of lumbar region, initial encounter [S39.012A] ? ?SUBJECTIVE: ? ?PERTINENT PAST HISTORY:  ?Osteopenia, spondylolisthesis, hx of cancer    ?     ?PRECAUTIONS: Osteopenia, spondylolisthesis, hx of cancer   ? ?WEIGHT BEARING RESTRICTIONS No ? ?FALLS:  ?Has patient fallen in last 6 months? Yes, Number of falls: 1 (resulting in referral injury) ? ?LIVING ENVIRONMENT: ?Lives with: lives with their spouse ?Stairs: No;  ? ?MOI/History of condition: ? ?Onset date: ~5 weeks ago ? ?Gina Johns is a 65 y.o. female who presents to clinic with chief complaint of midline and bilateral low back pain L>R following fall in which she was standing on 1 leg with other leg raised and raising arms above head (exercise) when she lost her balance and fell on her L hip.  Since this time she has had pain when in prolonged positions and at night.  Denies radiating pain or n/t in legs.  No fracture  ? ?From referring provider:  ? ?"Very pleasant 66 year old female comes in today complaining of left-sided low back pain that began 19 days ago after she fell directly onto her left side.  She was seen shortly afterwards at her primary care office.  X-rays  were obtained of her lumbar spine which are available for my review.  She was placed on methocarbamol but that has not been helpful.  She has also tried Tylenol but that too has been ineffective.  She localizes her pain diffusely along the left side of her low back and posterior hip.  She denies any groin pain.  She has symptoms with prolonged sitting and with prolonged walking.  Also some pain at night when turning over in bed.  Hot water has been helpful.  Overall, she does feel like she is improving albeit slowly.  Denies any radiating pain into her legs.  No associated numbness or tingling." ? ? Red flags:  ?denies  ? ?Pain:  ?Are you having pain? Yes ?Pain location: low back pain ?NPRS scale:   ?current 5/10  ?average 6/10  ?Aggravating factors: turning in bed, standing (immediate), sitting (2-3 hours) ? NPRS, highest: 7/10 ?Relieving factors: immediately waking up, hot pack ? NPRS: best: 1/10 ?Pain description: intermittent, sharp, and aching ?Stage: Subacute ?Stability: getting better ?24 hour pattern: better first thing in morning  ? ?Occupation: Pharmacist, hospital ? ?Assistive Device: none ? ?Hand Dominance: NA ? ?Patient Goals/Specific Activities: massage and to reduce pain ? ? ?PLOF: Independent ? ?DIAGNOSTIC FINDINGS: x-ray clear for fracture ? ? ?OBJECTIVE:  ? ?GENERAL OBSERVATION/GAIT: ? Reduced lumbar lor ? ?SENSATION: ? Light touch: Appears intact ? ?MUSCLE LENGTH: ?Hamstrings: Right no restriction; Left no restriction ?ASLR: Right ASLR = PSLR; Left ASLR = PSLR ? ?LUMBAR AROM ? ?AROM AROM  ?09/27/2021  ?Flexion WNL, w/ no pain  ?Extension limited by 50%, w/ concordant pain  ?Right lateral flexion WNL  ?Left lateral flexion WNL, w/ concordant pain  ?Right rotation WNL  ?Left rotation WNL  ?  ?(Blank rows = not tested) ? ?DIRECTIONAL PREFERENCE: ? None clear, relief with PA ? ?LE MMT: ? ?MMT Right ?09/27/2021 Left ?09/27/2021  ?Hip flexion (L2, L3)    ?Knee extension (L3)    ?Knee flexion    ?Hip abduction 3+* 3+*  ?Hip extension    ?Hip external rotation    ?Hip internal rotation    ?Hip adduction    ?Ankle dorsiflexion (L4)    ?Ankle plantarflexion (S1)    ?Ankle inversion    ?Ankle eversion    ?Great Toe ext (L5)    ?Grossly    ? ?(Blank rows = not tested, score listed is out of 5 possible points.  N = WNL, D = diminished, C = clear for gross weakness with myotome testing, * = concordant pain with testing) ? ?Functional Tests ? ?Eval (09/27/2021)    ?Progressive balance screen (highest level completed for >/= 10''): ? ?Tandem: R in rear 4'', L in rear 3''    ?Sustained supine bridge (dominant leg extended at 120'', if reached): 120'' with pain (norm 170'') ?    ?    ?    ?    ?    ?    ?    ?    ?    ?    ?    ?     ?    ? ? ? LUMBAR SPECIAL TESTS:  ?Straight leg raise: L (-), R (-) ? ? PALPATION:  ? TTP bil lumbar paraspinals  ? ? SPINAL SEGMENTAL MOBILITY ASSESSMENT:  ?Lower lumbar "relief" ? ?PATIENT SURVEYS:  ?Modified Oswestry 36%  ? ? ?TODAY'S TREATMENT  ?Therex - Creating, reviewing, and completing below HEP ? ?Manual therapy - completing and demonstrating  lower lumbar PA to pts husband ? ? ?PATIENT EDUCATION:  ?POC, diagnosis, prognosis, HEP, and outcome measures.  Pt educated via explanation, demonstration, and handout (HEP).  Pt confirms understanding verbally.  ? ?ASTERISK SIGNS ? ? ?Asterisk Signs Eval (09/27/2021)       ?Pain with sustained bridge        ?Lifting from ground/lumbar flexion        ?Balance        ?Hip abduction strength        ?        ? ? ?HOME EXERCISE PROGRAM: ?Access Code: [email or web address] ?URL: [email or web address] ?Date: 09/26/2021 ?Prepared by: Shearon Balo ? ?Exercises ?- Supine Lower Trunk Rotation  - 1 x daily - 7 x weekly - 1 sets - 20 reps - 3 hold ?- Supine Posterior Pelvic Tilt  - 2 x daily - 7 x weekly - 2 sets - 10 reps - 5'' hold ? ?ASSESSMENT: ? ?CLINICAL IMPRESSION: ?Naliah is a 66 y.o. female who presents to clinic with signs and sxs consistent with lumbar strain/mechanical low back pain following L sided fall in early march.  This has been slowly improving, but the pain is still quite limiting.  Ruling down nerve root involvement with (-) SLR and no radicular complaint.   ? ?OBJECTIVE IMPAIRMENTS: Pain, lumbar ROM, LE/core strength ? ?ACTIVITY LIMITATIONS: bending, lifting, standing, walking, housework ? ?PERSONAL FACTORS: See medical history and pertinent history ? ? ?REHAB POTENTIAL: Good ? ?CLINICAL DECISION MAKING: Stable/uncomplicated ? ?EVALUATION COMPLEXITY: Low ? ? ?GOALS: ? ? ?SHORT TERM GOALS: ? ?Chenelle will be >75% HEP compliant to improve carryover between sessions and facilitate independent management of condition ? ?Evaluation (09/27/2021):  ongoing ?Target date: 11/22/2021 ?Goal status: INITIAL ? ? ?LONG TERM GOALS: ? ?Ashira will self report >/= 50% decrease in pain from evaluation  ? ?Evaluation/Baseline (09/27/2021): 7/10 max pain ?Target date: 11/22/2021 ?G

## 2021-09-27 ENCOUNTER — Encounter: Payer: Self-pay | Admitting: Physical Therapy

## 2021-10-02 DIAGNOSIS — E785 Hyperlipidemia, unspecified: Secondary | ICD-10-CM | POA: Diagnosis not present

## 2021-10-02 DIAGNOSIS — M859 Disorder of bone density and structure, unspecified: Secondary | ICD-10-CM | POA: Diagnosis not present

## 2021-10-02 DIAGNOSIS — R7989 Other specified abnormal findings of blood chemistry: Secondary | ICD-10-CM | POA: Diagnosis not present

## 2021-10-02 DIAGNOSIS — M2550 Pain in unspecified joint: Secondary | ICD-10-CM | POA: Diagnosis not present

## 2021-10-03 DIAGNOSIS — C50811 Malignant neoplasm of overlapping sites of right female breast: Secondary | ICD-10-CM | POA: Diagnosis not present

## 2021-10-03 DIAGNOSIS — Z79811 Long term (current) use of aromatase inhibitors: Secondary | ICD-10-CM | POA: Diagnosis not present

## 2021-10-03 DIAGNOSIS — Z923 Personal history of irradiation: Secondary | ICD-10-CM | POA: Diagnosis not present

## 2021-10-03 DIAGNOSIS — Z170 Estrogen receptor positive status [ER+]: Secondary | ICD-10-CM | POA: Diagnosis not present

## 2021-10-03 DIAGNOSIS — I890 Lymphedema, not elsewhere classified: Secondary | ICD-10-CM | POA: Diagnosis not present

## 2021-10-03 DIAGNOSIS — Z9011 Acquired absence of right breast and nipple: Secondary | ICD-10-CM | POA: Diagnosis not present

## 2021-10-03 DIAGNOSIS — C50411 Malignant neoplasm of upper-outer quadrant of right female breast: Secondary | ICD-10-CM | POA: Diagnosis not present

## 2021-10-04 ENCOUNTER — Ambulatory Visit: Payer: Medicare Other

## 2021-10-04 DIAGNOSIS — M545 Low back pain, unspecified: Secondary | ICD-10-CM

## 2021-10-04 DIAGNOSIS — S39012A Strain of muscle, fascia and tendon of lower back, initial encounter: Secondary | ICD-10-CM | POA: Diagnosis not present

## 2021-10-04 DIAGNOSIS — M6281 Muscle weakness (generalized): Secondary | ICD-10-CM | POA: Diagnosis not present

## 2021-10-04 DIAGNOSIS — R2681 Unsteadiness on feet: Secondary | ICD-10-CM | POA: Diagnosis not present

## 2021-10-04 NOTE — Therapy (Signed)
?OUTPATIENT PHYSICAL THERAPY TREATMENT NOTE ? ? ?Patient Name: Gina Johns ?MRN: [id number] ?DOB:09-25-1955, 66 y.o., female ?Today's Date: 10/04/2021 ? ?PCP: Prince Solian, MD ?REFERRING PROVIDER: Thurman Coyer, DO ? ?END OF SESSION:  ? PT End of Session - 10/04/21 1226   ? ? Visit Number 2   ? Date for PT Re-Evaluation 11/22/21   ? Authorization Type BCBS - MCR - PN on visit 10   ? Progress Note Due on Visit 10   ? PT Start Time 1225   ? PT Stop Time 1301   5 min taken out for TPDN  ? PT Time Calculation (min) 36 min   ? Activity Tolerance Patient tolerated treatment well   ? Behavior During Therapy [facility] for tasks assessed/performed   ? ?  ?  ? ?  ? ? ?Past Medical History:  ?Diagnosis Date  ? Anxiety   ? Arthritis   ? Breast cancer ([place]) 12/07/2011  ? right breast 2x3 cm  ? Depression   ? " sometimes"  ? Hypercholesterolemia   ? PMH  ? Right knee pain 08/26/2012  ? ?Past Surgical History:  ?Procedure Laterality Date  ? BREAST LUMPECTOMY WITH AXILLARY LYMPH NODE BIOPSY    ? CESAREAN SECTION    ? Hx: of times 2  ? MASS EXCISION Right 02/25/2014  ? Procedure: EXCISION MASS RIGHT CHEST WALL;  Surgeon: Autumn Messing III, MD;  Location: [place];  Service: General;  Laterality: Right;  ? MASTECTOMY Right   ? TOTAL MASTECTOMY Right 03/20/2013  ? Procedure: TOTAL MASTECTOMY;  Surgeon: Merrie Roof, MD;  Location: [place];  Service: General;  Laterality: Right;  ? ?Patient Active Problem List  ? Diagnosis Date Noted  ? Memory loss 06/10/2018  ? Depression with anxiety 06/10/2018  ? Low serum vitamin D 06/10/2018  ? Osteopenia determined by x-ray 04/05/2016  ? Upper airway cough syndrome 10/13/2015  ? Osteopenia 02/22/2014  ? 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 Lymphedema 10/19/2013  ? Breast cancer of upper-outer quadrant of right female breast ([place]) 03/06/2013  ? Dyslipidemia 01/22/2013  ? Right knee pain 08/26/2012  ? ? ?REFERRING DIAG:  ?Referral diagnosis: Strain of lumbar region,  initial encounter [S39.012A] ? ?THERAPY DIAG:  ?Low back pain without sciatica, unspecified back pain laterality, unspecified chronicity ? ?Muscle weakness ? ?PERTINENT HISTORY:  ?Osteopenia, spondylolisthesis, hx of cancer  ? ?PRECAUTIONS:  ?Osteopenia, spondylolisthesis, hx of cancer  ? ?SUBJECTIVE: Pt presents to PT with continued reports of L sided lower back pain. Notes that pain would increase during performance of HEP. Pt expresses interest in TPDN. Is ready to begin PT at this time. ? ?PAIN:  ?Are you having pain? Yes ?Pain location: low back pain ?NPRS scale:  ?current 5/10  ?average 6/10  ?Aggravating factors: turning in bed, standing (immediate), sitting (2-3 hours) ?          NPRS, highest: 7/10 ?Relieving factors: immediately waking up, hot pack ?          NPRS: best: 1/10 ?Pain description: intermittent, sharp, and aching ?Stage: Subacute ?Stability: getting better ?24 hour pattern: better first thing in morning  ? ?OBJECTIVE:  ?  ?GENERAL OBSERVATION/GAIT: ?          Reduced lumbar lor ?  ?SENSATION: ?         Light touch: Appears intact ?  ?MUSCLE LENGTH: ?Hamstrings: Right no restriction; Left no restriction ?ASLR: Right ASLR = PSLR; Left ASLR =  PSLR ?  ?LUMBAR AROM ?  ?AROM AROM  ?09/27/2021  ?Flexion WNL, w/ no pain  ?Extension limited by 50%, w/ concordant pain  ?Right lateral flexion WNL  ?Left lateral flexion WNL, w/ concordant pain  ?Right rotation WNL  ?Left rotation WNL  ?  ?(Blank rows = not tested) ?  ?DIRECTIONAL PREFERENCE: ?          None clear, relief with PA ?  ?LE MMT: ?  ?MMT Right ?09/27/2021 Left ?09/27/2021  ?Hip flexion (L2, L3)      ?Knee extension (L3)      ?Knee flexion      ?Hip abduction 3+* 3+*  ?Hip extension      ?Hip external rotation      ?Hip internal rotation      ?Hip adduction      ?Ankle dorsiflexion (L4)      ?Ankle plantarflexion (S1)      ?Ankle inversion      ?Ankle eversion      ?Great Toe ext (L5)      ?Grossly      ?  ?(Blank rows = not tested, score listed is  out of 5 possible points.  N = WNL, D = diminished, C = clear for gross weakness with myotome testing, * = concordant pain with testing) ?  ?Functional Tests ?  ?Eval (09/27/2021)      ?Progressive balance screen (highest level completed for >/= 10''): ?  ?Tandem: R in rear 4'', L in rear 3''      ?Sustained supine bridge (dominant leg extended at 120'', if reached): 120'' with pain (norm 170'') ?       ?       ?       ?       ?       ?       ?       ?       ?       ?       ?       ?       ?       ?  ?  ? LUMBAR SPECIAL TESTS:  ?Straight leg raise: L (-), R (-) ?  ? PALPATION:  ?          TTP bil lumbar paraspinals  ?  ? SPINAL SEGMENTAL MOBILITY ASSESSMENT:  ?Lower lumbar "relief" ?  ?PATIENT SURVEYS:  ?Modified Oswestry 36%  ?  ?  ?TODAY'S TREATMENT  ?[facility] Adult PT Treatment: DATE: 10/04/2021 ?Therapeutic Exercise: ?LTR x 5 ?Bridge 2x10  ?Manual Therapy: ?STM to lumbar paraspinals ?Grade III PA mobs to L4-S1 ? Trigger Point Dry Needling Treatment: ?Pre-treatment instruction: Patient instructed on dry needling rationale, procedures, and possible side effects including pain during treatment (achy,cramping feeling), bruising, drop of blood, lightheadedness, nausea, sweating. ?Patient Consent Given: Yes ?Education handout provided: No ?Muscles treated: L lumbar multifidi   ?Needle size and number: .30x40m x 2 ?Electrical stimulation performed: No ?Parameters: N/A ?Treatment response/outcome: Palpable decrease in muscle tension ?Post-treatment instructions: Patient instructed to expect possible mild to moderate muscle soreness later today and/or tomorrow. Patient instructed in methods to reduce muscle soreness and to continue prescribed HEP. If patient was dry needled over the lung field, patient was instructed on signs and symptoms of pneumothorax and, however unlikely, to see immediate medical attention should they occur. Patient was also educated on signs and symptoms of infection and to seek medical attention  should  they occur. Patient verbalized understanding of these instructions and education.   ? ?PATIENT EDUCATION:  ?HEP update ?  ?ASTERISK SIGNS ?  ?  ?Asterisk Signs Eval (09/27/2021)            ?Pain with sustained bridge              ?Lifting from ground/lumbar flexion              ?Balance              ?Hip abduction strength              ?               ?  ?  ?HOME EXERCISE PROGRAM: ?Access Code: [email or web address] ?URL: [email or web address] ?Date: 10/04/2021 ?Prepared by: Octavio Manns ? ?Exercises ?- Supine Lower Trunk Rotation  - 1 x daily - 7 x weekly - 1 sets - 20 reps - 3 hold ?- Supine Posterior Pelvic Tilt  - 2 x daily - 7 x weekly - 2 sets - 10 reps - 5'' hold ?- Supine Bridge  - 1 x daily - 7 x weekly - 2 sets - 10 reps - 3" hold ?  ?ASSESSMENT: ?  ?CLINICAL IMPRESSION: ?Pt responded well to therapy today, noting decreased pain post manual therapy interventions and TPDN. HEP updated for further strengthening of core and proximal hip as well as education for decreasing range and intensity of exercise if pain increases. Pt continues to benefit from skilled PT services and will continue to be seen and progressed as able.  ?  ?OBJECTIVE IMPAIRMENTS: Pain, lumbar ROM, LE/core strength ?  ?ACTIVITY LIMITATIONS: bending, lifting, standing, walking, housework ?  ?PERSONAL FACTORS: See medical history and pertinent history ?  ?  ?GOALS: ?  ?  ?SHORT TERM GOALS: ?  ?Olamide will be >75% HEP compliant to improve carryover between sessions and facilitate independent management of condition ?  ?Evaluation (09/27/2021): ongoing ?Target date: 11/22/2021 ?Goal status: INITIAL ?  ?  ?LONG TERM GOALS: ?  ?Lashe will self report >/= 50% decrease in pain from evaluation  ?  ?Evaluation/Baseline (09/27/2021): 7/10 max pain ?Target date: 11/22/2021 ?Goal status: INITIAL ?  ?  ?2.  Verner will show a >/= 16 pt improvement in their ODI score (MCID is 12 pts) as a proxy for functional improvement  ?  ?Evaluation/Baseline (09/27/2021): 36  pts ?Target date: 11/22/2021 ?Goal status: INITIAL ?  ?  ?3.  Annisa will be able to lift 25 lbs from the floor and place on a 3 foot counter, not limited by pain  ?  ?Evaluation/Baseline (09/27/2021): pain

## 2021-10-05 DIAGNOSIS — E785 Hyperlipidemia, unspecified: Secondary | ICD-10-CM | POA: Diagnosis not present

## 2021-10-05 DIAGNOSIS — Z0000 Encounter for general adult medical examination without abnormal findings: Secondary | ICD-10-CM | POA: Diagnosis not present

## 2021-10-05 DIAGNOSIS — H25813 Combined forms of age-related cataract, bilateral: Secondary | ICD-10-CM | POA: Diagnosis not present

## 2021-10-05 DIAGNOSIS — J45909 Unspecified asthma, uncomplicated: Secondary | ICD-10-CM | POA: Diagnosis not present

## 2021-10-05 DIAGNOSIS — H35033 Hypertensive retinopathy, bilateral: Secondary | ICD-10-CM | POA: Diagnosis not present

## 2021-10-05 DIAGNOSIS — H04123 Dry eye syndrome of bilateral lacrimal glands: Secondary | ICD-10-CM | POA: Diagnosis not present

## 2021-10-05 DIAGNOSIS — H40013 Open angle with borderline findings, low risk, bilateral: Secondary | ICD-10-CM | POA: Diagnosis not present

## 2021-10-05 DIAGNOSIS — F419 Anxiety disorder, unspecified: Secondary | ICD-10-CM | POA: Diagnosis not present

## 2021-10-06 ENCOUNTER — Ambulatory Visit: Payer: Medicare Other | Admitting: Physical Therapy

## 2021-10-07 ENCOUNTER — Encounter: Payer: Self-pay | Admitting: Physical Therapy

## 2021-10-07 ENCOUNTER — Ambulatory Visit: Payer: Medicare Other | Admitting: Physical Therapy

## 2021-10-07 DIAGNOSIS — M6281 Muscle weakness (generalized): Secondary | ICD-10-CM

## 2021-10-07 DIAGNOSIS — M545 Low back pain, unspecified: Secondary | ICD-10-CM

## 2021-10-07 DIAGNOSIS — R2681 Unsteadiness on feet: Secondary | ICD-10-CM

## 2021-10-07 DIAGNOSIS — S39012A Strain of muscle, fascia and tendon of lower back, initial encounter: Secondary | ICD-10-CM | POA: Diagnosis not present

## 2021-10-07 NOTE — Therapy (Signed)
?OUTPATIENT PHYSICAL THERAPY TREATMENT NOTE ? ? ?Patient Name: Gina Johns ?MRN: [id number] ?DOB:[birth year], 66 y.o., female ?Today's Date: 10/07/2021 ? ?PCP: Prince Solian, MD ?REFERRING PROVIDER: Thurman Coyer, DO ? ?END OF SESSION:  ? PT End of Session - 10/07/21 0901   ? ? Visit Number 3   ? Date for PT Re-Evaluation 11/22/21   ? Authorization Type BCBS - MCR - PN on visit 10   ? Progress Note Due on Visit 10   ? PT Start Time 0900   ? PT Stop Time 0940   ? PT Time Calculation (min) 40 min   ? Activity Tolerance Patient tolerated treatment well   ? Behavior During Therapy [facility] for tasks assessed/performed   ? ?  ?  ? ?  ? ? ?Past Medical History:  ?Diagnosis Date  ? Anxiety   ? Arthritis   ? Breast cancer ([place]) 12/07/2011  ? right breast 2x3 cm  ? Depression   ? " sometimes"  ? Hypercholesterolemia   ? PMH  ? Right knee pain 08/26/2012  ? ?Past Surgical History:  ?Procedure Laterality Date  ? BREAST LUMPECTOMY WITH AXILLARY LYMPH NODE BIOPSY    ? CESAREAN SECTION    ? Hx: of times 2  ? MASS EXCISION Right 02/25/2014  ? Procedure: EXCISION MASS RIGHT CHEST WALL;  Surgeon: Autumn Messing III, MD;  Location: [place];  Service: General;  Laterality: Right;  ? MASTECTOMY Right   ? TOTAL MASTECTOMY Right 03/20/2013  ? Procedure: TOTAL MASTECTOMY;  Surgeon: Merrie Roof, MD;  Location: [place];  Service: General;  Laterality: Right;  ? ?Patient Active Problem List  ? Diagnosis Date Noted  ? Memory loss 06/10/2018  ? Depression with anxiety 06/10/2018  ? Low serum vitamin D 06/10/2018  ? Osteopenia determined by x-ray 04/05/2016  ? Upper airway cough syndrome 10/13/2015  ? Osteopenia 02/22/2014  ? 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 Lymphedema 10/19/2013  ? Breast cancer of upper-outer quadrant of right female breast ([place]) 03/06/2013  ? Dyslipidemia 01/22/2013  ? Right knee pain 08/26/2012  ? ? ?REFERRING DIAG:  ?Referral diagnosis: Strain of lumbar region, initial encounter  [S39.012A] ? ?THERAPY DIAG:  ?Low back pain without sciatica, unspecified back pain laterality, unspecified chronicity ? ?Muscle weakness ? ?Unsteadiness on feet ? ?PERTINENT HISTORY:  ?Osteopenia, spondylolisthesis, hx of cancer  ? ?PRECAUTIONS:  ?Osteopenia, spondylolisthesis, hx of cancer  ? ?SUBJECTIVE:  ? ?Pt report that the TDN was helpful for her pain. ? ?PAIN:  ?Are you having pain? Yes ?Pain location: low back pain ?NPRS scale:  ?current 6/10  ?Aggravating factors: turning in bed, standing (immediate), sitting (2-3 hours) ?Relieving factors: immediately waking up, hot pack ?Pain description: intermittent, sharp, and aching ?Stage: Subacute ? ?OBJECTIVE:  ?  ?GENERAL OBSERVATION/GAIT: ?          Reduced lumbar lor ?  ?SENSATION: ?         Light touch: Appears intact ?  ?MUSCLE LENGTH: ?Hamstrings: Right no restriction; Left no restriction ?ASLR: Right ASLR = PSLR; Left ASLR = PSLR ?  ?LUMBAR AROM ?  ?AROM AROM  ?09/27/2021  ?Flexion WNL, w/ no pain  ?Extension limited by 50%, w/ concordant pain  ?Right lateral flexion WNL  ?Left lateral flexion WNL, w/ concordant pain  ?Right rotation WNL  ?Left rotation WNL  ?  ?(Blank rows = not tested) ?  ?DIRECTIONAL PREFERENCE: ?  None clear, relief with PA ?  ?LE MMT: ?  ?MMT Right ?09/27/2021 Left ?09/27/2021  ?Hip flexion (L2, L3)      ?Knee extension (L3)      ?Knee flexion      ?Hip abduction 3+* 3+*  ?Hip extension      ?Hip external rotation      ?Hip internal rotation      ?Hip adduction      ?Ankle dorsiflexion (L4)      ?Ankle plantarflexion (S1)      ?Ankle inversion      ?Ankle eversion      ?Great Toe ext (L5)      ?Grossly      ?  ?(Blank rows = not tested, score listed is out of 5 possible points.  N = WNL, D = diminished, C = clear for gross weakness with myotome testing, * = concordant pain with testing) ?  ?Functional Tests ?  ?Eval (09/27/2021)      ?Progressive balance screen (highest level completed for >/= 10''): ?  ?Tandem: R in rear 4'', L in  rear 3''      ?Sustained supine bridge (dominant leg extended at 120'', if reached): 120'' with pain (norm 170'') ?       ?       ?       ?       ?       ?       ?       ?       ?       ?       ?       ?       ?       ?  ?  ? LUMBAR SPECIAL TESTS:  ?Straight leg raise: L (-), R (-) ?  ? PALPATION:  ?          TTP bil lumbar paraspinals  ?  ? SPINAL SEGMENTAL MOBILITY ASSESSMENT:  ?Lower lumbar "relief" ?  ?PATIENT SURVEYS:  ?Modified Oswestry 36%  ?  ?  ?TODAY'S TREATMENT  ? ?[facility] Adult PT Treatment: DATE: 10/07/2021 ?Therapeutic Exercise: ?LTR x 5 ?nu-step L5 20mwhile taking subjective and planning session with patient ?PPT - 2x10 ?W/ gait belt resistance ?Bridge 2x10 5'' hold ?Alternating clam with RTB - 10x ea ?Hip adduction ball squeeze - 5'' 2x10 ?Manual Therapy: ?STM to lumbar paraspinals ?Grade III PA mobs to L4-S1 (NT) ?Skilled palpation for TDN ? Trigger Point Dry Needling Treatment: ?Pre-treatment instruction: Patient instructed on dry needling rationale, procedures, and possible side effects including pain during treatment (achy,cramping feeling), bruising, drop of blood, lightheadedness, nausea, sweating. ?Patient Consent Given: Yes ?Education handout provided: No ?Muscles treated: L and R lumbar multifidi   ?Needle size and number: .30x562mx 4 ?Electrical stimulation performed: No ?Parameters: N/A ?Treatment response/outcome: Palpable decrease in muscle tension ?Post-treatment instructions: Patient instructed to expect possible mild to moderate muscle soreness later today and/or tomorrow. Patient instructed in methods to reduce muscle soreness and to continue prescribed HEP. If patient was dry needled over the lung field, patient was instructed on signs and symptoms of pneumothorax and, however unlikely, to see immediate medical attention should they occur. Patient was also educated on signs and symptoms of infection and to seek medical attention should they occur. Patient verbalized understanding of  these instructions and education.   ? ? ?[facility]: DATE: 10[facility]04[facility]20[facility] ?Therapeutic Exercise: ?LTR x 5 ?  Bridge 2x10  ?Manual Therapy: ?STM to lumbar paraspinals ?Grade III PA mobs to L4-S1 ? Trigger Point Dry Needling Treatment: ?Pre-treatment instruction: Patient instructed on dry needling rationale, procedures, and possible side effects including pain during treatment (achy,cramping feeling), bruising, drop of blood, lightheadedness, nausea, sweating. ?Patient Consent Given: Yes ?Education handout provided: No ?Muscles treated: L lumbar multifidi   ?Needle size and number: .30x57m x 2 ?Electrical stimulation performed: No ?Parameters: N/A ?Treatment response/outcome: Palpable decrease in muscle tension ?Post-treatment instructions: Patient instructed to expect possible mild to moderate muscle soreness later today and/or tomorrow. Patient instructed in methods to reduce muscle soreness and to continue prescribed HEP. If patient was dry needled over the lung field, patient was instructed on signs and symptoms of pneumothorax and, however unlikely, to see immediate medical attention should they occur. Patient was also educated on signs and symptoms of infection and to seek medical attention should they occur. Patient verbalized understanding of these instructions and education.   ? ?PATIENT EDUCATION:  ?HEP update ?  ?ASTERISK SIGNS ?  ?  ?Asterisk Signs Eval (09/27/2021)            ?Pain with sustained bridge              ?Lifting from ground/lumbar flexion              ?Balance              ?Hip abduction strength              ?               ?  ?  ?HOME EXERCISE PROGRAM: ?Access Code: [email or web address] ?URL: [email or web address] ?Date: 10/07/2021 ?Prepared by: KShearon Balo? ?Exercises ?- Supine Lower Trunk Rotation  - 1 x daily - 7 x weekly - 1 sets - 20 reps - 3 hold ?- Supine Posterior Pelvic Tilt  - 2 x daily - 7 x weekly - 2 sets - 10 reps - 5'' hold ?- Supine Bridge  - 1 x daily - 7 x  weekly - 2 sets - 10 reps - 5" hold ?- Hooklying Isometric Clamshell  - 2 x daily - 7 x weekly - 3 sets - 10 reps ?- Supine Hip Adduction Isometric with Ball  - 1 x daily - 7 x weekly - 2 sets - 10 reps - 10'' hold ?

## 2021-10-10 ENCOUNTER — Ambulatory Visit: Payer: Medicare Other

## 2021-10-12 ENCOUNTER — Ambulatory Visit: Payer: Medicare Other

## 2021-10-12 DIAGNOSIS — M545 Low back pain, unspecified: Secondary | ICD-10-CM | POA: Diagnosis not present

## 2021-10-12 DIAGNOSIS — R2681 Unsteadiness on feet: Secondary | ICD-10-CM | POA: Diagnosis not present

## 2021-10-12 DIAGNOSIS — M6281 Muscle weakness (generalized): Secondary | ICD-10-CM | POA: Diagnosis not present

## 2021-10-12 DIAGNOSIS — S39012A Strain of muscle, fascia and tendon of lower back, initial encounter: Secondary | ICD-10-CM | POA: Diagnosis not present

## 2021-10-12 NOTE — Therapy (Signed)
?OUTPATIENT PHYSICAL THERAPY TREATMENT NOTE ? ? ?Patient Name: Gina Johns ?MRN: [id number] ?DOB:1956-04-06, 66 y.o., female ?Today's Date: 10/12/2021 ? ?PCP: Prince Solian, MD ?REFERRING PROVIDER: Thurman Coyer, DO ? ?END OF SESSION:  ? PT End of Session - 10/12/21 1723   ? ? Visit Number 4   ? Date for PT Re-Evaluation 11/22/21   ? Authorization Type BCBS - MCR - PN on visit 10   ? Progress Note Due on Visit 10   ? PT Start Time 1700   ? PT Stop Time 1745   5 minutes of TPDN insertion  ? PT Time Calculation (min) 45 min   ? Activity Tolerance Patient tolerated treatment well   ? Behavior During Therapy [facility] for tasks assessed/performed   ? ?  ?  ? ?  ? ? ? ?Past Medical History:  ?Diagnosis Date  ? Anxiety   ? Arthritis   ? Breast cancer ([place]) 12/07/2011  ? right breast 2x3 cm  ? Depression   ? " sometimes"  ? Hypercholesterolemia   ? PMH  ? Right knee pain 08/26/2012  ? ?Past Surgical History:  ?Procedure Laterality Date  ? BREAST LUMPECTOMY WITH AXILLARY LYMPH NODE BIOPSY    ? CESAREAN SECTION    ? Hx: of times 2  ? MASS EXCISION Right 02/25/2014  ? Procedure: EXCISION MASS RIGHT CHEST WALL;  Surgeon: Autumn Messing III, MD;  Location: [place];  Service: General;  Laterality: Right;  ? MASTECTOMY Right   ? TOTAL MASTECTOMY Right 03/20/2013  ? Procedure: TOTAL MASTECTOMY;  Surgeon: Merrie Roof, MD;  Location: [place];  Service: General;  Laterality: Right;  ? ?Patient Active Problem List  ? Diagnosis Date Noted  ? Memory loss 06/10/2018  ? Depression with anxiety 06/10/2018  ? Low serum vitamin D 06/10/2018  ? Osteopenia determined by x-ray 04/05/2016  ? Upper airway cough syndrome 10/13/2015  ? Osteopenia 02/22/2014  ? 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 Lymphedema 10/19/2013  ? Breast cancer of upper-outer quadrant of right female breast ([place]) 03/06/2013  ? Dyslipidemia 01/22/2013  ? Right knee pain 08/26/2012  ? ? ?REFERRING DIAG:  ?Referral diagnosis: Strain of lumbar  region, initial encounter [S39.012A] ? ?THERAPY DIAG:  ?Low back pain without sciatica, unspecified back pain laterality, unspecified chronicity ? ?Muscle weakness ? ?Unsteadiness on feet ? ?PERTINENT HISTORY:  ?Osteopenia, spondylolisthesis, hx of cancer  ? ?PRECAUTIONS:  ?Osteopenia, spondylolisthesis, hx of cancer  ? ?SUBJECTIVE:  ? ?Pt reports no LBP today, although she reports 5/10 Lt anterior hip pain today. She reports that she would like TPDN again today. ? ?PAIN:  ?Are you having pain? Yes ?Pain location: Rt anterior hip ?NPRS scale:  ?current 5/10  ?Aggravating factors: turning in bed, standing (immediate), sitting (2-3 hours) ?Relieving factors: immediately waking up, hot pack ?Pain description: intermittent, sharp, and aching ?Stage: Subacute ? ?OBJECTIVE:  ?  ?GENERAL OBSERVATION/GAIT: ?          Reduced lumbar lor ?  ?SENSATION: ?         Light touch: Appears intact ?  ?MUSCLE LENGTH: ?Hamstrings: Right no restriction; Left no restriction ?ASLR: Right ASLR = PSLR; Left ASLR = PSLR ?  ?LUMBAR AROM ?  ?AROM AROM  ?09/27/2021  ?Flexion WNL, w/ no pain  ?Extension limited by 50%, w/ concordant pain  ?Right lateral flexion WNL  ?Left lateral flexion WNL, w/ concordant pain  ?Right rotation WNL  ?Left rotation WNL  ?  ?(  Blank rows = not tested) ?  ?DIRECTIONAL PREFERENCE: ?          None clear, relief with PA ?  ?LE MMT: ?  ?MMT Right ?09/27/2021 Left ?09/27/2021  ?Hip flexion (L2, L3)      ?Knee extension (L3)      ?Knee flexion      ?Hip abduction 3+* 3+*  ?Hip extension      ?Hip external rotation      ?Hip internal rotation      ?Hip adduction      ?Ankle dorsiflexion (L4)      ?Ankle plantarflexion (S1)      ?Ankle inversion      ?Ankle eversion      ?Great Toe ext (L5)      ?Grossly      ?  ?(Blank rows = not tested, score listed is out of 5 possible points.  N = WNL, D = diminished, C = clear for gross weakness with myotome testing, * = concordant pain with testing) ?  ?Functional Tests ?  ?Eval (09/27/2021)       ?Progressive balance screen (highest level completed for >/= 10''): ?  ?Tandem: R in rear 4'', L in rear 3''      ?Sustained supine bridge (dominant leg extended at 120'', if reached): 120'' with pain (norm 170'') ?       ?       ?       ?       ?       ?       ?       ?       ?       ?       ?       ?       ?       ?  ?  ? LUMBAR SPECIAL TESTS:  ?Straight leg raise: L (-), R (-) ?  ? PALPATION:  ?          TTP bil lumbar paraspinals  ?  ? SPINAL SEGMENTAL MOBILITY ASSESSMENT:  ?Lower lumbar "relief" ?  ?PATIENT SURVEYS:  ?Modified Oswestry 36%  ?  ?  ?TODAY'S TREATMENT  ? ?[facility] Adult PT Treatment:                                                DATE: 10/12/2021 ?Therapeutic Exercise: ?Knee planks 3x30sec ?Prone swimmers 3x12 ?DKTC stretch 3x30sec ?Seated trunk rotation stretch x10 BIL ?Standing abdominal press-down with 20# cable 3x10 ?Standing trunk extension stretch 2x10 ?Manual Therapy: ?Effleurage and tapotement to BIL lumbar paraspinals ?Skilled palpation to identify trigger points prior to TPDN ?Neuromuscular re-ed: ?N/A ?Therapeutic Activity: ?N/A ?Modalities: ?N/A ?Self Care: ?N/A ? ?Trigger Point Dry-Needling  ?Treatment instructions: Expect mild to moderate muscle soreness. S/S of pneumothorax if dry needled over a lung field, and to seek immediate medical attention should they occur. Patient verbalized understanding of these instructions and education. ? ?Patient Consent Given: Yes ?Education handout provided: Previously provided ?Muscles treated: L3-L4 lumbar multifidi BIL ?Electrical stimulation performed: No ?Parameters: N/A ?Treatment response/outcome: Improved muscle extensibility  ? ? ? ?[facility] Adult PT Treatment: DATE: 10/07/2021 ?Therapeutic Exercise: ?LTR x 5 ?nu-step L5 69mwhile taking subjective and planning session with patient ?PPT - 2x10 ?W/ gait belt resistance ?Bridge 2x10 5''  hold ?Alternating clam with RTB - 10x ea ?Hip adduction ball squeeze - 5'' 2x10 ?Manual Therapy: ?STM to lumbar  paraspinals ?Grade III PA mobs to L4-S1 (NT) ?Skilled palpation for TDN ? Trigger Point Dry Needling Treatment: ?Pre-treatment instruction: Patient instructed on dry needling rationale, procedures, and possible side effects including pain during treatment (achy,cramping feeling), bruising, drop of blood, lightheadedness, nausea, sweating. ?Patient Consent Given: Yes ?Education handout provided: No ?Muscles treated: L and R lumbar multifidi   ?Needle size and number: .30x18m x 4 ?Electrical stimulation performed: No ?Parameters: N/A ?Treatment response/outcome: Palpable decrease in muscle tension ?Post-treatment instructions: Patient instructed to expect possible mild to moderate muscle soreness later today and/or tomorrow. Patient instructed in methods to reduce muscle soreness and to continue prescribed HEP. If patient was dry needled over the lung field, patient was instructed on signs and symptoms of pneumothorax and, however unlikely, to see immediate medical attention should they occur. Patient was also educated on signs and symptoms of infection and to seek medical attention should they occur. Patient verbalized understanding of these instructions and education.   ? ? [facility] ?Therapeutic Exercise: ?LTR x 5 ?Bridge 2x10  ?Manual Therapy: ?STM to lumbar paraspinals ?Grade III PA mobs to L4-S1 ? Trigger Point Dry Needling Treatment: ?Pre-treatment instruction: Patient instructed on dry needling rationale, procedures, and possible side effects including pain during treatment (achy,cramping feeling), bruising, drop of blood, lightheadedness, nausea, sweating. ?Patient Consent Given: Yes ?Education handout provided: No ?Muscles treated: L lumbar multifidi   ?Needle size and number: .30x556mx 2 ?Electrical stimulation performed: No ?Parameters: N/A ?Treatment response/outcome: Palpable decrease in muscle tension ?Post-treatment instructions: Patient instructed to expect possible  mild to moderate muscle soreness later today and/or tomorrow. Patient instructed in methods to reduce muscle soreness and to continue prescribed HEP. If patient was dry needled over the lung field, patient

## 2021-10-27 NOTE — Therapy (Signed)
?OUTPATIENT PHYSICAL THERAPY TREATMENT NOTE ? ? ?Patient Name: Gina Johns ?MRN: [id number] ?DOB:April 19, 1956, 66 y.o., female ?Today's Date: 10/28/2021 ? ?PCP: Prince Solian, MD ?REFERRING PROVIDER: Prince Solian, MD ? ?END OF SESSION:  ? PT End of Session - 10/28/21 2130   ? ? Visit Number 5   ? Date for PT Re-Evaluation 11/22/21   ? Authorization Type BCBS - MCR - PN on visit 10   ? Progress Note Due on Visit 10   ? PT Start Time [phone]   ? PT Stop Time 0935   5 minutes TPDN  ? PT Time Calculation (min) 44 min   ? Activity Tolerance Patient tolerated treatment well   ? Behavior During Therapy [facility] for tasks assessed/performed   ? ?  ?  ? ?  ? ? ? ? ?Past Medical History:  ?Diagnosis Date  ? Anxiety   ? Arthritis   ? Breast cancer ([place]) 12/07/2011  ? right breast 2x3 cm  ? Depression   ? " sometimes"  ? Hypercholesterolemia   ? PMH  ? Right knee pain 08/26/2012  ? ?Past Surgical History:  ?Procedure Laterality Date  ? BREAST LUMPECTOMY WITH AXILLARY LYMPH NODE BIOPSY    ? CESAREAN SECTION    ? Hx: of times 2  ? MASS EXCISION Right 02/25/2014  ? Procedure: EXCISION MASS RIGHT CHEST WALL;  Surgeon: Autumn Messing III, MD;  Location: [place];  Service: General;  Laterality: Right;  ? MASTECTOMY Right   ? TOTAL MASTECTOMY Right 03/20/2013  ? Procedure: TOTAL MASTECTOMY;  Surgeon: Merrie Roof, MD;  Location: [place];  Service: General;  Laterality: Right;  ? ?Patient Active Problem List  ? Diagnosis Date Noted  ? Memory loss 06/10/2018  ? Depression with anxiety 06/10/2018  ? Low serum vitamin D 06/10/2018  ? Osteopenia determined by x-ray 04/05/2016  ? Upper airway cough syndrome 10/13/2015  ? Osteopenia 02/22/2014  ? 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 Lymphedema 10/19/2013  ? Breast cancer of upper-outer quadrant of right female breast ([place]) 03/06/2013  ? Dyslipidemia 01/22/2013  ? Right knee pain 08/26/2012  ? ? ?REFERRING DIAG:  ?Referral diagnosis: Strain of lumbar region, initial  encounter [S39.012A] ? ?THERAPY DIAG:  ?Low back pain without sciatica, unspecified back pain laterality, unspecified chronicity ? ?Muscle weakness ? ?Unsteadiness on feet ? ?PERTINENT HISTORY:  ?Osteopenia, spondylolisthesis, hx of cancer  ? ?PRECAUTIONS:  ?Osteopenia, spondylolisthesis, hx of cancer  ? ?SUBJECTIVE:  ? ?Pt reports that she rode in her car for 1.5 hours this morning, which increased her LBP to 5/10. She reports that holding her hand on her low back helps. She adds that she has been doing her HEP.  ? ?PAIN:  ?Are you having pain? Yes ?Pain location: Rt anterior hip ?NPRS scale:  ?current 5/10  ?Aggravating factors: turning in bed, standing (immediate), sitting (2-3 hours) ?Relieving factors: immediately waking up, hot pack ?Pain description: intermittent, sharp, and aching ?Stage: Subacute ? ?OBJECTIVE:  ?  ?GENERAL OBSERVATION/GAIT: ?          Reduced lumbar lor ?  ?SENSATION: ?         Light touch: Appears intact ?  ?MUSCLE LENGTH: ?Hamstrings: Right no restriction; Left no restriction ?ASLR: Right ASLR = PSLR; Left ASLR = PSLR ?  ?LUMBAR AROM ?  ?AROM AROM  ?09/27/2021  ?Flexion WNL, w/ no pain  ?Extension limited by 50%, w/ concordant pain  ?Right lateral flexion WNL  ?  Left lateral flexion WNL, w/ concordant pain  ?Right rotation WNL  ?Left rotation WNL  ?  ?(Blank rows = not tested) ?  ?DIRECTIONAL PREFERENCE: ?          None clear, relief with PA ?  ?LE MMT: ?  ?MMT Right ?09/27/2021 Left ?09/27/2021  ?Hip flexion (L2, L3)      ?Knee extension (L3)      ?Knee flexion      ?Hip abduction 3+* 3+*  ?Hip extension      ?Hip external rotation      ?Hip internal rotation      ?Hip adduction      ?Ankle dorsiflexion (L4)      ?Ankle plantarflexion (S1)      ?Ankle inversion      ?Ankle eversion      ?Great Toe ext (L5)      ?Grossly      ?  ?(Blank rows = not tested, score listed is out of 5 possible points.  N = WNL, D = diminished, C = clear for gross weakness with myotome testing, * = concordant pain  with testing) ?  ?Functional Tests ?  ?Eval (09/27/2021)      ?Progressive balance screen (highest level completed for >/= 10''): ?  ?Tandem: R in rear 4'', L in rear 3''      ?Sustained supine bridge (dominant leg extended at 120'', if reached): 120'' with pain (norm 170'') ?       ?       ?       ?       ?       ?       ?       ?       ?       ?       ?       ?       ?       ?  ?  ? LUMBAR SPECIAL TESTS:  ?Straight leg raise: L (-), R (-) ?  ? PALPATION:  ?          TTP bil lumbar paraspinals  ?  ? SPINAL SEGMENTAL MOBILITY ASSESSMENT:  ?Lower lumbar "relief" ?  ?PATIENT SURVEYS:  ?Modified Oswestry 36%  ?  ?  ?TODAY'S TREATMENT  ? ?[facility] Adult PT Treatment:                                                DATE: 10/28/2021 ?Therapeutic Exercise: ?Supine 90/90 abdominal isometric with handhold resistance 3x30sec ?Supine LTR 2x10 BIL ?Standing trunk extension stretch 2x10 ?Manual Therapy: ?Skilled manual therapy to identify trigger points ?Effleurage/ deep tissue massage to needled muscles following TPDN ?Neuromuscular re-ed: ?Attended E-stim dry needling to L3-L4 lumbar multifidi x10 minutes ?Therapeutic Activity: ?N/A ?Modalities: ?N/A ?Self Care: ?N/A ? ?Trigger Point Dry-Needling  ?Treatment instructions: Expect mild to moderate muscle soreness. S/S of pneumothorax if dry needled over a lung field, and to seek immediate medical attention should they occur. Patient verbalized understanding of these instructions and education. ? ?Patient Consent Given: Yes ?Education handout provided: Previously provided ?Muscles treated: L3-L4 lumbar multifidi ?Electrical stimulation performed: Yes ?Parameters:  microAmps, frequency: 2, level 3 intensity x10 minutes , preceded by TPDN with pistoning 3/4 way to lamina ?Treatment response/outcome: Improved muscle extensibility, multiple twitch  responses, sustained contractions during E-stim ? ? ? ?[facility] Adult PT Treatment:                                                DATE:  10/12/2021 ?Therapeutic Exercise: ?Knee planks 3x30sec ?Prone swimmers 3x12 ?DKTC stretch 3x30sec ?Seated trunk rotation stretch x10 BIL ?Standing abdominal press-down with 20# cable 3x10 ?Standing trunk extension stretch 2x10 ?Manual Therapy: ?Effleurage and tapotement to BIL lumbar paraspinals ?Skilled palpation to identify trigger points prior to TPDN ?Neuromuscular re-ed: ?N/A ?Therapeutic Activity: ?N/A ?Modalities: ?N/A ?Self Care: ?N/A ? ?Trigger Point Dry-Needling  ?Treatment instructions: Expect mild to moderate muscle soreness. S/S of pneumothorax if dry needled over a lung field, and to seek immediate medical attention should they occur. Patient verbalized understanding of these instructions and education. ? ?Patient Consent Given: Yes ?Education handout provided: Previously provided ?Muscles treated: L3-L4 lumbar multifidi BIL ?Electrical stimulation performed: No ?Parameters: N/A ?Treatment response/outcome: Improved muscle extensibility  ? ? ? ?[facility] Adult PT Treatment: DATE: 10/07/2021 ?Therapeutic Exercise: ?LTR x 5 ?nu-step L5 57mwhile taking subjective and planning session with patient ?PPT - 2x10 ?W/ gait belt resistance ?Bridge 2x10 5'' hold ?Alternating clam with RTB - 10x ea ?Hip adduction ball squeeze - 5'' 2x10 ?Manual Therapy: ?STM to lumbar paraspinals ?Grade III PA mobs to L4-S1 (NT) ?Skilled palpation for TDN ? Trigger Point Dry Needling Treatment: ?Pre-treatment instruction: Patient instructed on dry needling rationale, procedures, and possible side effects including pain during treatment (achy,cramping feeling), bruising, drop of blood, lightheadedness, nausea, sweating. ?Patient Consent Given: Yes ?Education handout provided: No ?Muscles treated: L and R lumbar multifidi   ?Needle size and number: .30x525mx 4 ?Electrical stimulation performed: No ?Parameters: N/A ?Treatment response/outcome: Palpable decrease in muscle tension ?Post-treatment instructions: Patient instructed to expect  possible mild to moderate muscle soreness later today and/or tomorrow. Patient instructed in methods to reduce muscle soreness and to continue prescribed HEP. If patient was dry needled over the lung field, patient was i

## 2021-10-28 ENCOUNTER — Ambulatory Visit: Payer: Medicare Other | Attending: Sports Medicine

## 2021-10-28 DIAGNOSIS — R2681 Unsteadiness on feet: Secondary | ICD-10-CM | POA: Diagnosis not present

## 2021-10-28 DIAGNOSIS — M6281 Muscle weakness (generalized): Secondary | ICD-10-CM | POA: Insufficient documentation

## 2021-10-28 DIAGNOSIS — M545 Low back pain, unspecified: Secondary | ICD-10-CM | POA: Diagnosis not present

## 2021-11-02 ENCOUNTER — Ambulatory Visit: Payer: Medicare Other | Admitting: Physical Therapy

## 2021-11-02 ENCOUNTER — Encounter: Payer: Self-pay | Admitting: Physical Therapy

## 2021-11-02 DIAGNOSIS — R2681 Unsteadiness on feet: Secondary | ICD-10-CM | POA: Diagnosis not present

## 2021-11-02 DIAGNOSIS — M6281 Muscle weakness (generalized): Secondary | ICD-10-CM | POA: Diagnosis not present

## 2021-11-02 DIAGNOSIS — M545 Low back pain, unspecified: Secondary | ICD-10-CM | POA: Diagnosis not present

## 2021-11-02 NOTE — Therapy (Signed)
?OUTPATIENT PHYSICAL THERAPY TREATMENT NOTE ? ? ?Patient Name: Gina Johns ?MRN: [id number] ?DOB:June 21, 1956, 66 y.o., female ?Today's Date: 11/02/2021 ? ?PCP: Prince Solian, MD ?REFERRING PROVIDER: Prince Solian, MD ? ?END OF SESSION:  ? PT End of Session - 11/02/21 1754   ? ? Visit Number 6   ? Date for PT Re-Evaluation 11/22/21   ? Authorization Type BCBS - MCR - PN on visit 10   ? Progress Note Due on Visit 10   ? PT Start Time 5366   ? PT Stop Time 4403   ? PT Time Calculation (min) 38 min   ? Activity Tolerance Patient tolerated treatment well   ? Behavior During Therapy [facility] for tasks assessed/performed   ? ?  ?  ? ?  ? ? ? ? ?Past Medical History:  ?Diagnosis Date  ? Anxiety   ? Arthritis   ? Breast cancer ([place]) 12/07/2011  ? right breast 2x3 cm  ? Depression   ? " sometimes"  ? Hypercholesterolemia   ? PMH  ? Right knee pain 08/26/2012  ? ?Past Surgical History:  ?Procedure Laterality Date  ? BREAST LUMPECTOMY WITH AXILLARY LYMPH NODE BIOPSY    ? CESAREAN SECTION    ? Hx: of times 2  ? MASS EXCISION Right 02/25/2014  ? Procedure: EXCISION MASS RIGHT CHEST WALL;  Surgeon: Autumn Messing III, MD;  Location: [place];  Service: General;  Laterality: Right;  ? MASTECTOMY Right   ? TOTAL MASTECTOMY Right 03/20/2013  ? Procedure: TOTAL MASTECTOMY;  Surgeon: Merrie Roof, MD;  Location: [place];  Service: General;  Laterality: Right;  ? ?Patient Active Problem List  ? Diagnosis Date Noted  ? Memory loss 06/10/2018  ? Depression with anxiety 06/10/2018  ? Low serum vitamin D 06/10/2018  ? Osteopenia determined by x-ray 04/05/2016  ? Upper airway cough syndrome 10/13/2015  ? Osteopenia 02/22/2014  ? 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 Lymphedema 10/19/2013  ? Breast cancer of upper-outer quadrant of right female breast ([place]) 03/06/2013  ? Dyslipidemia 01/22/2013  ? Right knee pain 08/26/2012  ? ? ?REFERRING DIAG:  ?Referral diagnosis: Strain of lumbar region, initial encounter  [S39.012A] ? ?THERAPY DIAG:  ?Low back pain without sciatica, unspecified back pain laterality, unspecified chronicity ? ?Muscle weakness ? ?Unsteadiness on feet ? ?PERTINENT HISTORY:  ?Osteopenia, spondylolisthesis, hx of cancer  ? ?PRECAUTIONS:  ?Osteopenia, spondylolisthesis, hx of cancer  ? ?SUBJECTIVE:  ? ?Pt reports that overall she feels she is improving.  She feels that TDN is particularly helpful. ? ?PAIN:  ?Are you having pain? Yes ?Pain location: Rt anterior hip ?NPRS scale:  ?current 4/10  ?Aggravating factors: turning in bed, standing (immediate), sitting (2-3 hours) ?Relieving factors: immediately waking up, hot pack ?Pain description: intermittent, sharp, and aching ?Stage: Subacute ? ?OBJECTIVE:  ?  ?GENERAL OBSERVATION/GAIT: ?          Reduced lumbar lor ?  ?SENSATION: ?         Light touch: Appears intact ?  ?MUSCLE LENGTH: ?Hamstrings: Right no restriction; Left no restriction ?ASLR: Right ASLR = PSLR; Left ASLR = PSLR ?  ?LUMBAR AROM ?  ?AROM AROM  ?09/27/2021  ?Flexion WNL, w/ no pain  ?Extension limited by 50%, w/ concordant pain  ?Right lateral flexion WNL  ?Left lateral flexion WNL, w/ concordant pain  ?Right rotation WNL  ?Left rotation WNL  ?  ?(Blank rows = not tested) ?  ?DIRECTIONAL PREFERENCE: ?  None clear, relief with PA ?  ?LE MMT: ?  ?MMT Right ?09/27/2021 Left ?09/27/2021  ?Hip flexion (L2, L3)      ?Knee extension (L3)      ?Knee flexion      ?Hip abduction 3+* 3+*  ?Hip extension      ?Hip external rotation      ?Hip internal rotation      ?Hip adduction      ?Ankle dorsiflexion (L4)      ?Ankle plantarflexion (S1)      ?Ankle inversion      ?Ankle eversion      ?Great Toe ext (L5)      ?Grossly      ?  ?(Blank rows = not tested, score listed is out of 5 possible points.  N = WNL, D = diminished, C = clear for gross weakness with myotome testing, * = concordant pain with testing) ?  ?Functional Tests ?  ?Eval (09/27/2021)      ?Progressive balance screen (highest level completed  for >/= 10''): ?  ?Tandem: R in rear 4'', L in rear 3''      ?Sustained supine bridge (dominant leg extended at 120'', if reached): 120'' with pain (norm 170'') ?       ?       ?       ?       ?       ?       ?       ?       ?       ?       ?       ?       ?       ?  ?  ? LUMBAR SPECIAL TESTS:  ?Straight leg raise: L (-), R (-) ?  ? PALPATION:  ?          TTP bil lumbar paraspinals  ?  ? SPINAL SEGMENTAL MOBILITY ASSESSMENT:  ?Lower lumbar "relief" ?  ?PATIENT SURVEYS:  ?Modified Oswestry 36%  ?  ?  ?TODAY'S TREATMENT  ? ?[facility] Adult PT Treatment:                                                DATE: 11/02/2021 ?Therapeutic Exercise: ?Prone press up  ?Prone swimmers ?Manual Therapy: ?Skilled manual therapy to identify trigger points ?deep tissue massage to needled muscles following TPDN ?Manual L QL stretch with pt in R S/L ? ?Trigger Point Dry-Needling  ?Treatment instructions: Expect mild to moderate muscle soreness. S/S of pneumothorax if dry needled over a lung field, and to seek immediate medical attention should they occur. Patient verbalized understanding of these instructions and education. ? ?Patient Consent Given: Yes ?Education handout provided: Previously provided ?Muscles treated: L3-L4 lumbar multifidi ?Electrical stimulation performed: Yes ?Parameters: 5 min low frequency - milli amps - low intensity; 5 min - micro amps - high frequency high intensity. ? ?Treatment response/outcome: Improved muscle extensibility ? ?[facility] Adult PT Treatment:                                                DATE: 10/28/2021 ?Therapeutic Exercise: ?Supine 90/90 abdominal isometric  with handhold resistance 3x30sec ?Supine LTR 2x10 BIL ?Standing trunk extension stretch 2x10 ?Manual Therapy: ?Skilled manual therapy to identify trigger points ?Effleurage/ deep tissue massage to needled muscles following TPDN ?Neuromuscular re-ed: ?Attended E-stim dry needling to L3-L4 lumbar multifidi x10 minutes ?Therapeutic  Activity: ?N/A ?Modalities: ?N/A ?Self Care: ?N/A ? ?Trigger Point Dry-Needling  ?Treatment instructions: Expect mild to moderate muscle soreness. S/S of pneumothorax if dry needled over a lung field, and to seek immediate medical attention should they occur. Patient verbalized understanding of these instructions and education. ? ?Patient Consent Given: Yes ?Education handout provided: Previously provided ?Muscles treated: L3-L4 lumbar multifidi ?Electrical stimulation performed: Yes ?Parameters:  microAmps, frequency: 2, level 3 intensity x10 minutes , preceded by TPDN with pistoning 3/4 way to lamina ?Treatment response/outcome: Improved muscle extensibility, multiple twitch responses, sustained contractions during E-stim ? ? ? ?[facility] Adult PT Treatment:                                                DATE: 10/12/2021 ?Therapeutic Exercise: ?Knee planks 3x30sec ?Prone swimmers 3x12 ?DKTC stretch 3x30sec ?Seated trunk rotation stretch x10 BIL ?Standing abdominal press-down with 20# cable 3x10 ?Standing trunk extension stretch 2x10 ?Manual Therapy: ?Effleurage and tapotement to BIL lumbar paraspinals ?Skilled palpation to identify trigger points prior to TPDN ?Neuromuscular re-ed: ?N/A ?Therapeutic Activity: ?N/A ?Modalities: ?N/A ?Self Care: ?N/A ? ?Trigger Point Dry-Needling  ?Treatment instructions: Expect mild to moderate muscle soreness. S/S of pneumothorax if dry needled over a lung field, and to seek immediate medical attention should they occur. Patient verbalized understanding of these instructions and education. ? ?Patient Consent Given: Yes ?Education handout provided: Previously provided ?Muscles treated: L3-L4 lumbar multifidi BIL ?Electrical stimulation performed: No ?Parameters: N/A ?Treatment response/outcome: Improved muscle extensibility  ? ? ? ?[facility] Adult PT Treatment: DATE: 10/07/2021 ?Therapeutic Exercise: ?LTR x 5 ?nu-step L5 24mwhile taking subjective and planning session with patient ?PPT -  2x10 ?W/ gait belt resistance ?Bridge 2x10 5'' hold ?Alternating clam with RTB - 10x ea ?Hip adduction ball squeeze - 5'' 2x10 ?Manual Therapy: ?STM to lumbar paraspinals ?Grade III PA mobs to L4-S1 (NT) ?Skilled palpation for TDN ? T

## 2021-11-08 ENCOUNTER — Ambulatory Visit: Payer: Medicare Other

## 2021-11-08 DIAGNOSIS — M6281 Muscle weakness (generalized): Secondary | ICD-10-CM | POA: Diagnosis not present

## 2021-11-08 DIAGNOSIS — R2681 Unsteadiness on feet: Secondary | ICD-10-CM

## 2021-11-08 DIAGNOSIS — M545 Low back pain, unspecified: Secondary | ICD-10-CM | POA: Diagnosis not present

## 2021-11-08 NOTE — Therapy (Signed)
?OUTPATIENT PHYSICAL THERAPY TREATMENT NOTE ? ? ?Patient Name: Gina Johns ?MRN: [id number] ?DOB:12-Jul-1955, 66 y.o., female ?Today's Date: 11/08/2021 ? ?PCP: Prince Solian, MD ?REFERRING PROVIDER: Prince Solian, MD ? ?END OF SESSION:  ? PT End of Session - 11/08/21 1752   ? ? Visit Number 7   ? Date for PT Re-Evaluation 11/22/21   ? Authorization Type BCBS - MCR - PN on visit 10   ? Progress Note Due on Visit 10   ? PT Start Time 7893   Pt arrived 7 minutes late to her appointment.  ? PT Stop Time 8101   ? PT Time Calculation (min) 38 min   ? Activity Tolerance Patient tolerated treatment well   ? Behavior During Therapy [facility] for tasks assessed/performed   ? ?  ?  ? ?  ? ? ? ? ? ?Past Medical History:  ?Diagnosis Date  ? Anxiety   ? Arthritis   ? Breast cancer ([place]) 12/07/2011  ? right breast 2x3 cm  ? Depression   ? " sometimes"  ? Hypercholesterolemia   ? PMH  ? Right knee pain 08/26/2012  ? ?Past Surgical History:  ?Procedure Laterality Date  ? BREAST LUMPECTOMY WITH AXILLARY LYMPH NODE BIOPSY    ? CESAREAN SECTION    ? Hx: of times 2  ? MASS EXCISION Right 02/25/2014  ? Procedure: EXCISION MASS RIGHT CHEST WALL;  Surgeon: Autumn Messing III, MD;  Location: [place];  Service: General;  Laterality: Right;  ? MASTECTOMY Right   ? TOTAL MASTECTOMY Right 03/20/2013  ? Procedure: TOTAL MASTECTOMY;  Surgeon: Merrie Roof, MD;  Location: [place];  Service: General;  Laterality: Right;  ? ?Patient Active Problem List  ? Diagnosis Date Noted  ? Memory loss 06/10/2018  ? Depression with anxiety 06/10/2018  ? Low serum vitamin D 06/10/2018  ? Osteopenia determined by x-ray 04/05/2016  ? Upper airway cough syndrome 10/13/2015  ? Osteopenia 02/22/2014  ? 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 Lymphedema 10/19/2013  ? Breast cancer of upper-outer quadrant of right female breast ([place]) 03/06/2013  ? Dyslipidemia 01/22/2013  ? Right knee pain 08/26/2012  ? ? ?REFERRING DIAG:  ?Referral diagnosis:  Strain of lumbar region, initial encounter [S39.012A] ? ?THERAPY DIAG:  ?Low back pain without sciatica, unspecified back pain laterality, unspecified chronicity ? ?Muscle weakness ? ?Unsteadiness on feet ? ?PERTINENT HISTORY:  ?Osteopenia, spondylolisthesis, hx of cancer  ? ?PRECAUTIONS:  ?Osteopenia, spondylolisthesis, hx of cancer  ? ?SUBJECTIVE:  ? ?Pt reports low levels of pain today, adding that TPDN has been helpful and that she has been adherent to her HEP. ? ?PAIN:  ?Are you having pain? Yes ?Pain location: Rt anterior hip ?NPRS scale:  ?current 2/10  ?Aggravating factors: turning in bed, standing (immediate), sitting (2-3 hours) ?Relieving factors: immediately waking up, hot pack ?Pain description: intermittent, sharp, and aching ?Stage: Subacute ? ?OBJECTIVE:  ?  ?GENERAL OBSERVATION/GAIT: ?          Reduced lumbar lor ?  ?SENSATION: ?         Light touch: Appears intact ?  ?MUSCLE LENGTH: ?Hamstrings: Right no restriction; Left no restriction ?ASLR: Right ASLR = PSLR; Left ASLR = PSLR ?  ?LUMBAR AROM ?  ?AROM AROM  ?09/27/2021  ?Flexion WNL, w/ no pain  ?Extension limited by 50%, w/ concordant pain  ?Right lateral flexion WNL  ?Left lateral flexion WNL, w/ concordant pain  ?Right rotation WNL  ?  Left rotation WNL  ?  ?(Blank rows = not tested) ?  ?DIRECTIONAL PREFERENCE: ?          None clear, relief with PA ?  ?LE MMT: ?  ?MMT Right ?09/27/2021 Left ?09/27/2021  ?Hip flexion (L2, L3)      ?Knee extension (L3)      ?Knee flexion      ?Hip abduction 3+* 3+*  ?Hip extension      ?Hip external rotation      ?Hip internal rotation      ?Hip adduction      ?Ankle dorsiflexion (L4)      ?Ankle plantarflexion (S1)      ?Ankle inversion      ?Ankle eversion      ?Great Toe ext (L5)      ?Grossly      ?  ?(Blank rows = not tested, score listed is out of 5 possible points.  N = WNL, D = diminished, C = clear for gross weakness with myotome testing, * = concordant pain with testing) ?  ?Functional Tests ?  ?Eval  (09/27/2021)      ?Progressive balance screen (highest level completed for >/= 10''): ?  ?Tandem: R in rear 4'', L in rear 3''      ?Sustained supine bridge (dominant leg extended at 120'', if reached): 120'' with pain (norm 170'') ?       ?       ?       ?       ?       ?       ?       ?       ?       ?       ?       ?       ?       ?  ?  ? LUMBAR SPECIAL TESTS:  ?Straight leg raise: L (-), R (-) ?  ? PALPATION:  ?          TTP bil lumbar paraspinals  ?  ? SPINAL SEGMENTAL MOBILITY ASSESSMENT:  ?Lower lumbar "relief" ?  ?PATIENT SURVEYS:  ?Modified Oswestry 36%  ?  ?  ?TODAY'S TREATMENT  ? ?[facility] Adult PT Treatment:                                                DATE: 11/08/2021 ?Therapeutic Exercise: ?Sidelying lumbar open book 2x10 BIL ?Marching bridges 3x20 ?Sidelying hip abduction 2x10 with 3-sec hold BIL ?Manual Therapy: ?Skilled manual therapy to identify trigger points ?Effleurage/ deep tissue massage to lumbar musculature following TPDN ?Neuromuscular re-ed: ?N/A ?Therapeutic Activity: ?N/A ?Modalities: ?Attended E-stim dry needling to L3-L4 lumbar multifidi x10 minutes ?Self Care: ?N/A ? ?Trigger Point Dry-Needling  ?Treatment instructions: Expect mild to moderate muscle soreness. S/S of pneumothorax if dry needled over a lung field, and to seek immediate medical attention should they occur. Patient verbalized understanding of these instructions and education. ? ?Patient Consent Given: Yes ?Education handout provided: Previously provided ?Muscles treated: L3-L4 lumbar multifidi ?Electrical stimulation performed: Yes ?Parameters: 5 min low frequency - milli amps - low intensity; 5 min - micro amps - high frequency high intensity. ? ?Treatment response/outcome: Improved muscle extensibility ? ? ?[facility] Adult PT Treatment:  DATE: 11/02/2021 ?Therapeutic Exercise: ?Prone press up  ?Prone swimmers ?Manual Therapy: ?Skilled manual therapy to identify trigger points ?deep  tissue massage to needled muscles following TPDN ?Manual L QL stretch with pt in R S/L ? ?Trigger Point Dry-Needling  ?Treatment instructions: Expect mild to moderate muscle soreness. S/S of pneumothorax if dry needled over a lung field, and to seek immediate medical attention should they occur. Patient verbalized understanding of these instructions and education. ? ?Patient Consent Given: Yes ?Education handout provided: Previously provided ?Muscles treated: L3-L4 lumbar multifidi ?Electrical stimulation performed: Yes ?Parameters: 5 min low frequency - milli amps - low intensity; 5 min - micro amps - high frequency high intensity. ? ?Treatment response/outcome: Improved muscle extensibility ? ?[facility] Adult PT Treatment:                                                DATE: 10/28/2021 ?Therapeutic Exercise: ?Supine 90/90 abdominal isometric with handhold resistance 3x30sec ?Supine LTR 2x10 BIL ?Standing trunk extension stretch 2x10 ?Manual Therapy: ?Skilled manual therapy to identify trigger points ?Effleurage/ deep tissue massage to needled muscles following TPDN ?Neuromuscular re-ed: ?Attended E-stim dry needling to L3-L4 lumbar multifidi x10 minutes ?Therapeutic Activity: ?N/A ?Modalities: ?N/A ?Self Care: ?N/A ? ?Trigger Point Dry-Needling  ?Treatment instructions: Expect mild to moderate muscle soreness. S/S of pneumothorax if dry needled over a lung field, and to seek immediate medical attention should they occur. Patient verbalized understanding of these instructions and education. ? ?Patient Consent Given: Yes ?Education handout provided: Previously provided ?Muscles treated: L3-L4 lumbar multifidi ?Electrical stimulation performed: Yes ?Parameters:  microAmps, frequency: 2, level 3 intensity x10 minutes , preceded by TPDN with pistoning 3/4 way to lamina ?Treatment response/outcome: Improved muscle extensibility, multiple twitch responses, sustained contractions during E-stim ? ? ? ? ? ? ?PATIENT EDUCATION:   ?HEP updated ?  ?ASTERISK SIGNS ?  ?  ?[facility] Signs Eval (09/27/2021)            ?Pain with sustained bridge              ?Lifting from ground/lumbar flexion              ?Balance              ?Hip abduction stren

## 2021-11-16 ENCOUNTER — Ambulatory Visit: Payer: Medicare Other

## 2021-11-16 DIAGNOSIS — M545 Low back pain, unspecified: Secondary | ICD-10-CM | POA: Diagnosis not present

## 2021-11-16 DIAGNOSIS — R2681 Unsteadiness on feet: Secondary | ICD-10-CM | POA: Diagnosis not present

## 2021-11-16 DIAGNOSIS — M6281 Muscle weakness (generalized): Secondary | ICD-10-CM

## 2021-11-16 NOTE — Therapy (Signed)
OUTPATIENT PHYSICAL THERAPY TREATMENT NOTE   Patient Name: Ciaira Natividad MRN: [id number] DOB:1956-01-06, 66 y.o., female Today's Date: 11/16/2021  PCP: Prince Solian, MD REFERRING PROVIDER: Prince Solian, MD  END OF SESSION:   PT End of Session - 11/16/21 1854     Visit Number 8    Date for PT Re-Evaluation 11/22/21    Authorization Type BCBS - MCR - PN on visit 10    Progress Note Due on Visit 10    PT Start Time 1836    PT Stop Time 1914    PT Time Calculation (min) 38 min    Activity Tolerance Patient tolerated treatment well    Behavior During Therapy [facility] for tasks assessed/performed                 Past Medical History:  Diagnosis Date   Anxiety    Arthritis    Breast cancer ([place]) 12/07/2011   right breast 2x3 cm   Depression    " sometimes"   Hypercholesterolemia    PMH   Right knee pain 08/26/2012   Past Surgical History:  Procedure Laterality Date   BREAST LUMPECTOMY WITH AXILLARY LYMPH NODE BIOPSY     CESAREAN SECTION     Hx: of times 2   MASS EXCISION Right 02/25/2014   Procedure: EXCISION MASS RIGHT CHEST WALL;  Surgeon: Autumn Messing III, MD;  Location: [place];  Service: General;  Laterality: Right;   MASTECTOMY Right    TOTAL MASTECTOMY Right 03/20/2013   Procedure: TOTAL MASTECTOMY;  Surgeon: Merrie Roof, MD;  Location: [place];  Service: General;  Laterality: Right;   Patient Active Problem List   Diagnosis Date Noted   Memory loss 06/10/2018   Depression with anxiety 06/10/2018   Low serum vitamin D 06/10/2018   Osteopenia determined by x-ray 04/05/2016   Upper airway cough syndrome 10/13/2015   Osteopenia 02/22/2014   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   Chondromalacia of patella, 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   Lymphedema 10/19/2013   Breast cancer of upper-outer quadrant of right female breast ([place]) 03/06/2013   Dyslipidemia 01/22/2013   Right knee pain 08/26/2012    REFERRING DIAG:  Referral diagnosis: Strain of lumbar region, initial encounter  [S39.012A]  THERAPY DIAG:  Low back pain without sciatica, unspecified back pain laterality, unspecified chronicity  Muscle weakness  Unsteadiness on feet  PERTINENT HISTORY:  Osteopenia, spondylolisthesis, hx of cancer   PRECAUTIONS:  Osteopenia, spondylolisthesis, hx of cancer   SUBJECTIVE:   Pt reports continued LBP, although she reports she has noticed an improvement in her overall symptoms. She requests TPDN today.  PAIN:  Are you having pain? Yes Pain location: Low back NPRS scale:  current 4/10  Aggravating factors: turning in bed, standing (immediate), sitting (2-3 hours) Relieving factors: immediately waking up, hot pack Pain description: intermittent, sharp, and aching Stage: Subacute  OBJECTIVE:    GENERAL OBSERVATION/GAIT:           Reduced lumbar lor   SENSATION:          Light touch: Appears intact   MUSCLE LENGTH: Hamstrings: Right no restriction; Left no restriction ASLR: Right ASLR = PSLR; Left ASLR = PSLR   LUMBAR AROM   AROM AROM  09/27/2021  Flexion WNL, w/ no pain  Extension limited by 50%, w/ concordant pain  Right lateral flexion WNL  Left lateral flexion WNL, w/ concordant pain  Right rotation WNL  Left rotation WNL    (Blank rows = not tested)  DIRECTIONAL PREFERENCE:           None clear, relief with PA   LE MMT:   MMT Right 09/27/2021 Left 09/27/2021  Hip flexion (L2, L3)      Knee extension (L3)      Knee flexion      Hip abduction 3+* 3+*  Hip extension      Hip external rotation      Hip internal rotation      Hip adduction      Ankle dorsiflexion (L4)      Ankle plantarflexion (S1)      Ankle inversion      Ankle eversion      Great Toe ext (L5)      Grossly        (Blank rows = not tested, score listed is out of 5 possible points.  N = WNL, D = diminished, C = clear for gross weakness with myotome testing, * = concordant pain with testing)   Functional Tests   Eval (09/27/2021)      Progressive balance screen  (highest level completed for >/= 10''):   Tandem: R in rear 4'', L in rear 3''      Sustained supine bridge (dominant leg extended at 120'', if reached): 120'' with pain (norm 170'')                                                                                                 LUMBAR SPECIAL TESTS:  Straight leg raise: L (-), R (-)    PALPATION:            TTP bil lumbar paraspinals     SPINAL SEGMENTAL MOBILITY ASSESSMENT:  Lower lumbar "relief"   PATIENT SURVEYS:  Modified Oswestry 36%      TODAY'S TREATMENT   [facility] Adult PT Treatment:                                                DATE: 11/16/2021 Therapeutic Exercise: Standing abdominal press-down with 20# cable 3x12 Mini-squat side steps with 7# cable to waist attachment at Free Motion machine x5 walk-outs BIL Manual Therapy: Skilled manual therapy to identify trigger points Effleurage/ deep tissue massage to lumbar musculature following TPDN Neuromuscular re-ed: N/A Therapeutic Activity: N/A Modalities: Attended E-stim dry needling to L3-L4 lumbar multifidi x10 minutes Self Care: N/A  Trigger Point Dry-Needling  Treatment instructions: Expect mild to moderate muscle soreness. S/S of pneumothorax if dry needled over a lung field, and to seek immediate medical attention should they occur. Patient verbalized understanding of these instructions and education.  Patient Consent Given: Yes Education handout provided: Previously provided Muscles treated: L3-L4 lumbar multifidi Electrical stimulation performed: Yes Parameters: 5 min low frequency - milli amps - low intensity; 5 min - micro amps - high frequency high intensity.  Treatment response/outcome: Improved muscle extensibility   [facility] Adult PT Treatment:  DATE: 11/08/2021 Therapeutic Exercise: Sidelying lumbar open book 2x10 BIL Marching bridges 3x20 Sidelying hip abduction 2x10 with 3-sec hold  BIL Manual Therapy: Skilled manual therapy to identify trigger points Effleurage/ deep tissue massage to lumbar musculature following TPDN Neuromuscular re-ed: N/A Therapeutic Activity: N/A Modalities: Attended E-stim dry needling to L3-L4 lumbar multifidi x10 minutes Self Care: N/A  Trigger Point Dry-Needling  Treatment instructions: Expect mild to moderate muscle soreness. S/S of pneumothorax if dry needled over a lung field, and to seek immediate medical attention should they occur. Patient verbalized understanding of these instructions and education.  Patient Consent Given: Yes Education handout provided: Previously provided Muscles treated: L3-L4 lumbar multifidi Electrical stimulation performed: Yes Parameters: 5 min low frequency - milli amps - low intensity; 5 min - micro amps - high frequency high intensity.  Treatment response/outcome: Improved muscle extensibility   [facility] Adult PT Treatment:                                                DATE: 11/02/2021 Therapeutic Exercise: Prone press up  Prone swimmers Manual Therapy: Skilled manual therapy to identify trigger points deep tissue massage to needled muscles following TPDN Manual L QL stretch with pt in R S/L  Trigger Point Dry-Needling  Treatment instructions: Expect mild to moderate muscle soreness. S/S of pneumothorax if dry needled over a lung field, and to seek immediate medical attention should they occur. Patient verbalized understanding of these instructions and education.  Patient Consent Given: Yes Education handout provided: Previously provided Muscles treated: L3-L4 lumbar multifidi Electrical stimulation performed: Yes Parameters: 5 min low frequency - milli amps - low intensity; 5 min - micro amps - high frequency high intensity.  Treatment response/outcome: Improved muscle extensibility    PATIENT EDUCATION:  HEP updated   ASTERISK SIGNS     Asterisk Signs Eval (09/27/2021)            Pain  with sustained bridge              Lifting from ground/lumbar flexion              Balance              Hip abduction strength                                 HOME EXERCISE PROGRAM: Access Code: [email or web address] URL: [email or web address] Date: 10/07/2021 Prepared by: Shearon Balo  Exercises - Supine Lower Trunk Rotation  - 1 x daily - 7 x weekly - 1 sets - 20 reps - 3 hold - Supine Posterior Pelvic Tilt  - 2 x daily - 7 x weekly - 2 sets - 10 reps - 5'' hold - Supine Bridge  - 1 x daily - 7 x weekly - 2 sets - 10 reps - 5" hold - Hooklying Isometric Clamshell  - 2 x daily - 7 x weekly - 3 sets - 10 reps - Supine Hip Adduction Isometric with Ball  - 1 x daily - 7 x weekly - 2 sets - 10 reps - 10'' hold  Added 10/12/2021:  - Prone Swimmer  - 1 x daily - 7 x weekly - 2 sets - 12 reps - Plank on Knees  - 1 x daily - 7 x weekly - 3  sets - to failure hold  Added 10/28/2021: - Abdominal Isometric Hold - FEET OFF TABLE*  - 1 x daily - 7 x weekly - 3 sets - 30 reps  Added 11/08/2021: - Sidelying Open Book Thoracic Lumbar Rotation and Extension  - 1 x daily - 7 x weekly - 2 sets - 10 reps - Marching Bridge  - 1 x daily - 7 x weekly - 3 sets - 10 reps - Sidelying Hip Abduction  - 1 x daily - 7 x weekly - 2 sets - 10 reps - 3-sec hold   ASSESSMENT:   CLINICAL IMPRESSION: Pt again arrived late to her appointment today, which led to a truncated session. She requests TPDN and manual therapy, to which the PT obliged. She again responded well to all interventions today and leaves with improved pain from 4/10 to 2/10. Pt will continue to benefit from skilled PT to address her primary impairments and return to her prior level of function with less limitation.   OBJECTIVE IMPAIRMENTS: Pain, lumbar ROM, LE/core strength   ACTIVITY LIMITATIONS: bending, lifting, standing, walking, housework   PERSONAL FACTORS: See medical history and pertinent history     GOALS:     SHORT TERM  GOALS:   Afnan will be >75% HEP compliant to improve carryover between sessions and facilitate independent management of condition   Evaluation (09/27/2021): ongoing Target date: 11/22/2021 Goal status: MET (4/15)     LONG TERM GOALS:   Annalise will self report >/= 50% decrease in pain from evaluation    Evaluation/Baseline (09/27/2021): 7/10 max pain Target date: 11/22/2021 Goal status: INITIAL     2.  Laquitha will show a >/= 16 pt improvement in their ODI score (MCID is 12 pts) as a proxy for functional improvement    Evaluation/Baseline (09/27/2021): 36 pts Target date: 11/22/2021 Goal status: INITIAL     3.  Lorilyn will be able to lift 25 lbs from the floor and place on a 3 foot counter, not limited by pain    Evaluation/Baseline (09/27/2021): painful Target date: 11/22/2021 Goal status: INITIAL     4.  Netasha will be able to maintain supine bridge for 120'' (dominant leg extended if 120'' reached) as evidence of improved hip extension and core strength with minimal discomfort (norm for healthy adult ~170'')    Evaluation/Baseline (09/27/2021): 46'' with pain Target date: 11/22/2021 Goal status: INITIAL     5.  Madailein will be able to stand for >30'' in tandem stance, to show a significant improvement in balance in order to reduce fall risk    Evaluation/Baseline (09/27/2021): R in rear 4'', L in rear 3'' Target date: 11/22/2021 Goal status: INITIAL     PLAN: PT FREQUENCY: 1-2x/week   PT DURATION: 8 weeks (Ending 11/22/2021)   PLANNED INTERVENTIONS: Therapeutic exercises, Aquatic therapy, Therapeutic activity, Neuro Muscular re-education, Gait training, Patient/Family education, Joint mobilization, Dry Needling, Electrical stimulation, Spinal mobilization and/or manipulation, Moist heat, Taping, Vasopneumatic device, Ionotophoresis 4mg /ml Dexamethasone, and Manual therapy   PLAN FOR NEXT SESSION: assess response to TPDN, gentle hip and lumbar ROM with progressive core and hip  strengthening, manual PRN (lumbar PA relieving on eval), balance    Cherie Ouch, PT 11/16/2021, 7:14 PM

## 2021-11-22 ENCOUNTER — Ambulatory Visit: Payer: Medicare Other

## 2021-11-22 DIAGNOSIS — M6281 Muscle weakness (generalized): Secondary | ICD-10-CM | POA: Diagnosis not present

## 2021-11-22 DIAGNOSIS — M545 Low back pain, unspecified: Secondary | ICD-10-CM

## 2021-11-22 DIAGNOSIS — R2681 Unsteadiness on feet: Secondary | ICD-10-CM

## 2021-11-22 NOTE — Therapy (Signed)
OUTPATIENT PHYSICAL THERAPY TREATMENT NOTE/ DISCHARGE SUMMARY   Patient Name: Gina Johns MRN: [id number] DOB:[birth year], 66 y.o., female Today's Date: 11/22/2021  PCP: Prince Solian, MD REFERRING PROVIDER: Thurman Coyer, DO  END OF SESSION:   PT End of Session - 11/22/21 1856     Visit Number 9    Date for PT Re-Evaluation 11/22/21    Authorization Type BCBS - MCR - PN on visit 10    Progress Note Due on Visit 10    PT Start Time 4098    PT Stop Time 1914    PT Time Calculation (min) 41 min    Activity Tolerance Patient tolerated treatment well    Behavior During Therapy [facility] for tasks assessed/performed                  Past Medical History:  Diagnosis Date   Anxiety    Arthritis    Breast cancer ([place]) 12/07/2011   right breast 2x3 cm   Depression    " sometimes"   Hypercholesterolemia    PMH   Right knee pain 08/26/2012   Past Surgical History:  Procedure Laterality Date   BREAST LUMPECTOMY WITH AXILLARY LYMPH NODE BIOPSY     CESAREAN SECTION     Hx: of times 2   MASS EXCISION Right 02/25/2014   Procedure: EXCISION MASS RIGHT CHEST WALL;  Surgeon: Autumn Messing III, MD;  Location: [place];  Service: General;  Laterality: Right;   MASTECTOMY Right    TOTAL MASTECTOMY Right 03/20/2013   Procedure: TOTAL MASTECTOMY;  Surgeon: Merrie Roof, MD;  Location: [place];  Service: General;  Laterality: Right;   Patient Active Problem List   Diagnosis Date Noted   Memory loss 06/10/2018   Depression with anxiety 06/10/2018   Low serum vitamin D 06/10/2018   Osteopenia determined by x-ray 04/05/2016   Upper airway cough syndrome 10/13/2015   Osteopenia 02/22/2014   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   Chondromalacia of patella, 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   Lymphedema 10/19/2013   Breast cancer of upper-outer quadrant of right female breast ([place]) 03/06/2013   Dyslipidemia 01/22/2013   Right knee pain 08/26/2012    REFERRING DIAG:  Referral diagnosis: Strain of lumbar  region, initial encounter [S39.012A]  THERAPY DIAG:  Low back pain without sciatica, unspecified back pain laterality, unspecified chronicity  Muscle weakness  Unsteadiness on feet  PERTINENT HISTORY:  Osteopenia, spondylolisthesis, hx of cancer   PRECAUTIONS:  Osteopenia, spondylolisthesis, hx of cancer   SUBJECTIVE:   Pt reports low-level (1-2/10) pain today. She reports adherence to her HEP and is agreeable to discharge from PT at this time.  PAIN:  Are you having pain? Yes Pain location: Low back NPRS scale:  current 4/10  Aggravating factors: turning in bed, standing (immediate), sitting (2-3 hours) Relieving factors: immediately waking up, hot pack Pain description: intermittent, sharp, and aching Stage: Subacute  OBJECTIVE:    GENERAL OBSERVATION/GAIT:           Reduced lumbar lor   SENSATION:          Light touch: Appears intact   MUSCLE LENGTH: Hamstrings: Right no restriction; Left no restriction ASLR: Right ASLR = PSLR; Left ASLR = PSLR   LUMBAR AROM   AROM AROM  09/27/2021 AROM 11/22/2021  Flexion WNL, w/ no pain WNL  Extension limited by 50%, w/ concordant pain WNL, no pain  Right lateral flexion WNL WNL  Left lateral flexion WNL, w/ concordant pain WNL  Right  rotation WNL WNL  Left rotation WNL WNL    (Blank rows = not tested)   DIRECTIONAL PREFERENCE:           None clear, relief with PA   LE MMT:   MMT Right 09/27/2021 Left 09/27/2021 Right 11/22/2021 Left 11/22/2021  Hip flexion (L2, L3)        Knee extension (L3)        Knee flexion        Hip abduction 3+* 3+* 5/5 5/5  Hip extension     4+/5 4+/5  Hip external rotation        Hip internal rotation        Hip adduction        Ankle dorsiflexion (L4)        Ankle plantarflexion (S1)        Ankle inversion        Ankle eversion        Great Toe ext (L5)        Grossly          (Blank rows = not tested, score listed is out of 5 possible points.  N = WNL, D = diminished, C = clear  for gross weakness with myotome testing, * = concordant pain with testing)   Functional Tests   Eval (09/27/2021)  11/22/2021    Progressive balance screen (highest level completed for >/= 10''):   Tandem: R in rear 4'', L in rear 3''  Rt in rear: 8 seconds; Lt is rear: 6 seconds    Sustained supine bridge (dominant leg extended at 120'', if reached): 120'' with pain (norm 170'')    175"      25# kettlebell ldead lift from floor to 3-foot counter X8 with good form                                                                                      LUMBAR SPECIAL TESTS:  Straight leg raise: L (-), R (-)    PALPATION:            TTP bil lumbar paraspinals     SPINAL SEGMENTAL MOBILITY ASSESSMENT:  Lower lumbar "relief"   PATIENT SURVEYS:  Modified Oswestry 36%  11/22/2021: 22%     TODAY'S TREATMENT   [facility] Adult PT Treatment:                                                DATE: 11/22/2021 Therapeutic Exercise: N/A Manual Therapy: N/A Neuromuscular re-ed: N/A Therapeutic Activity: Re-assessment of objective measures with pt education Re-administration of ODI with pt education 25# kettlebell dead lift to 3-foot counter 3x8 Modalities: Attended E-stim dry needling to L3-L4 lumbar multifidi x10 minutes Self Care: N/A  Trigger Point Dry-Needling  Treatment instructions: Expect mild to moderate muscle soreness. S/S of pneumothorax if dry needled over a lung field, and to seek immediate medical attention should they occur. Patient verbalized understanding of these instructions and education.  Patient Consent Given: Yes Education handout provided: Previously provided  Muscles treated: L3-L4 lumbar multifidi Electrical stimulation performed: Yes Parameters: 5 min low frequency - milli amps - low intensity; 5 min - micro amps - high frequency high intensity.  Treatment response/outcome: Improved muscle extensibility   [facility] Adult PT Treatment:                                                 DATE: 11/16/2021 Therapeutic Exercise: Standing abdominal press-down with 20# cable 3x12 Mini-squat side steps with 7# cable to waist attachment at Free Motion machine x5 walk-outs BIL Manual Therapy: Skilled manual therapy to identify trigger points Effleurage/ deep tissue massage to lumbar musculature following TPDN Neuromuscular re-ed: N/A Therapeutic Activity: N/A Modalities: Attended E-stim dry needling to L3-L4 lumbar multifidi x10 minutes Self Care: N/A  Trigger Point Dry-Needling  Treatment instructions: Expect mild to moderate muscle soreness. S/S of pneumothorax if dry needled over a lung field, and to seek immediate medical attention should they occur. Patient verbalized understanding of these instructions and education.  Patient Consent Given: Yes Education handout provided: Previously provided Muscles treated: L3-L4 lumbar multifidi Electrical stimulation performed: Yes Parameters: 5 min low frequency - milli amps - low intensity; 5 min - micro amps - high frequency high intensity.  Treatment response/outcome: Improved muscle extensibility   [facility] Adult PT Treatment:                                                DATE: 11/08/2021 Therapeutic Exercise: Sidelying lumbar open book 2x10 BIL Marching bridges 3x20 Sidelying hip abduction 2x10 with 3-sec hold BIL Manual Therapy: Skilled manual therapy to identify trigger points Effleurage/ deep tissue massage to lumbar musculature following TPDN Neuromuscular re-ed: N/A Therapeutic Activity: N/A Modalities: Attended E-stim dry needling to L3-L4 lumbar multifidi x10 minutes Self Care: N/A  Trigger Point Dry-Needling  Treatment instructions: Expect mild to moderate muscle soreness. S/S of pneumothorax if dry needled over a lung field, and to seek immediate medical attention should they occur. Patient verbalized understanding of these instructions and education.  Patient Consent Given:  Yes Education handout provided: Previously provided Muscles treated: L3-L4 lumbar multifidi Electrical stimulation performed: Yes Parameters: 5 min low frequency - milli amps - low intensity; 5 min - micro amps - high frequency high intensity.  Treatment response/outcome: Improved muscle extensibility       PATIENT EDUCATION:  HEP updated   ASTERISK SIGNS     Asterisk Signs Eval (09/27/2021)            Pain with sustained bridge              Lifting from ground/lumbar flexion              Balance              Hip abduction strength                                 HOME EXERCISE PROGRAM: Access Code: [email or web address] URL: [email or web address] Date: 10/07/2021 Prepared by: Shearon Balo  Exercises - Supine Lower Trunk Rotation  - 1 x daily - 7 x weekly - 1 sets - 20 reps - 3 hold - Supine Posterior Pelvic  Tilt  - 2 x daily - 7 x weekly - 2 sets - 10 reps - 5'' hold - Supine Bridge  - 1 x daily - 7 x weekly - 2 sets - 10 reps - 5" hold - Hooklying Isometric Clamshell  - 2 x daily - 7 x weekly - 3 sets - 10 reps - Supine Hip Adduction Isometric with Ball  - 1 x daily - 7 x weekly - 2 sets - 10 reps - 10'' hold  Added 10/12/2021:  - Prone Swimmer  - 1 x daily - 7 x weekly - 2 sets - 12 reps - Plank on Knees  - 1 x daily - 7 x weekly - 3 sets - to failure hold  Added 10/28/2021: - Abdominal Isometric Hold - FEET OFF TABLE*  - 1 x daily - 7 x weekly - 3 sets - 30 reps  Added 11/08/2021: - Sidelying Open Book Thoracic Lumbar Rotation and Extension  - 1 x daily - 7 x weekly - 2 sets - 10 reps - Marching Bridge  - 1 x daily - 7 x weekly - 3 sets - 10 reps - Sidelying Hip Abduction  - 1 x daily - 7 x weekly - 2 sets - 10 reps - 3-sec hold  Added 11/22/2021: - Diagonal Curl Up with Reach  - 1 x daily - 7 x weekly - 3 sets - 16 reps - Bird Dog  - 1 x daily - 7 x weekly - 3 sets - 20 reps   ASSESSMENT:   CLINICAL IMPRESSION: Upon re-assessment of objective measures and  goals, the pt has met all of her rehab goals and has made excellent progress in lumbar ROM, hip strength, functional ability (ODI), pain levels, and lifting capacity. Due to these metrics, along with pt's request to discontinue PT, the pt is discharged from PT at this time.   OBJECTIVE IMPAIRMENTS: Pain, lumbar ROM, LE/core strength   ACTIVITY LIMITATIONS: bending, lifting, standing, walking, housework   PERSONAL FACTORS: See medical history and pertinent history     GOALS:     SHORT TERM GOALS:   Angelee will be >75% HEP compliant to improve carryover between sessions and facilitate independent management of condition   Evaluation (09/27/2021): ongoing Target date: 11/22/2021 Goal status: MET (4/15)     LONG TERM GOALS:   Lynde will self report >/= 50% decrease in pain from evaluation    Evaluation/Baseline (09/27/2021): 7/10 max pain Target date: 11/22/2021 11/22/2021: Pt reports 50% improvement since starting PT, (3/10 today) Goal status: ACHIEVED     2.  Lean will show a >/= 16 pt improvement in their ODI score (MCID is 12 pts) as a proxy for functional improvement    Evaluation/Baseline (09/27/2021): 36 pts Target date: 11/22/2021 11/22/2021: 22% Goal status: ACHIEVED     3.  Gerardo will be able to lift 25 lbs from the floor and place on a 3 foot counter, not limited by pain    Evaluation/Baseline (09/27/2021): painful Target date: 11/22/2021 11/22/2021: Achieved x8 Goal status: ACHIEVED     4.  Baylei will be able to maintain supine bridge for 120'' (dominant leg extended if 120'' reached) as evidence of improved hip extension and core strength with minimal discomfort (norm for healthy adult ~170'')    Evaluation/Baseline (09/27/2021): 89'' with pain Target date: 11/22/2021 11/22/2021: 175" Goal status: ACHIEVED     5.  Abigial will be able to stand for >30'' in tandem stance, to show a significant  improvement in balance in order to reduce fall risk     Evaluation/Baseline (09/27/2021): R in rear 4'', L in rear 3'' Target date: 11/22/2021 11/22/2021: Rt in rear: 8", Lt in rear: 6" Goal status: PARTIALLY MET     PLAN: PT FREQUENCY: 1-2x/week   PT DURATION: 8 weeks (Ending 11/22/2021)   PLANNED INTERVENTIONS: Therapeutic exercises, Aquatic therapy, Therapeutic activity, Neuro Muscular re-education, Gait training, Patient/Family education, Joint mobilization, Dry Needling, Electrical stimulation, Spinal mobilization and/or manipulation, Moist heat, Taping, Vasopneumatic device, Ionotophoresis 4mg /ml Dexamethasone, and Manual therapy   PLAN FOR NEXT SESSION: Pt is discharged from PT at this time.  PHYSICAL THERAPY DISCHARGE SUMMARY  Visits from Start of Care: 9  Current functional level related to goals / functional outcomes: Pt has met or mostly met all of her rehab goals   Remaining deficits: Impairments in tandem stance balance   Education / Equipment: HEP   Patient agrees to discharge. Patient goals were met. Patient is being discharged due to meeting the stated rehab goals.   Vanessa Chancellor, PT, DPT 11/22/21 7:25 PM

## 2022-04-10 DIAGNOSIS — H40013 Open angle with borderline findings, low risk, bilateral: Secondary | ICD-10-CM | POA: Diagnosis not present

## 2022-07-25 ENCOUNTER — Other Ambulatory Visit: Payer: Self-pay | Admitting: Internal Medicine

## 2022-07-25 DIAGNOSIS — Z1231 Encounter for screening mammogram for malignant neoplasm of breast: Secondary | ICD-10-CM

## 2022-08-28 ENCOUNTER — Ambulatory Visit
Admission: RE | Admit: 2022-08-28 | Discharge: 2022-08-28 | Disposition: A | Discharge status: Home / Self Care | Payer: Medicare Other | Source: Ambulatory Visit | Attending: Internal Medicine | Admitting: Internal Medicine

## 2022-08-28 DIAGNOSIS — Z1231 Encounter for screening mammogram for malignant neoplasm of breast: Secondary | ICD-10-CM | POA: Diagnosis not present

## 2022-10-16 DIAGNOSIS — M25561 Pain in right knee: Secondary | ICD-10-CM | POA: Diagnosis not present

## 2022-10-16 DIAGNOSIS — M8580 Other specified disorders of bone density and structure, unspecified site: Secondary | ICD-10-CM | POA: Diagnosis not present

## 2022-10-16 DIAGNOSIS — E785 Hyperlipidemia, unspecified: Secondary | ICD-10-CM | POA: Diagnosis not present

## 2022-10-19 DIAGNOSIS — Z170 Estrogen receptor positive status [ER+]: Secondary | ICD-10-CM | POA: Diagnosis not present

## 2022-10-19 DIAGNOSIS — C50411 Malignant neoplasm of upper-outer quadrant of right female breast: Secondary | ICD-10-CM | POA: Diagnosis not present

## 2022-10-23 DIAGNOSIS — F419 Anxiety disorder, unspecified: Secondary | ICD-10-CM | POA: Diagnosis not present

## 2022-10-23 DIAGNOSIS — Z0000 Encounter for general adult medical examination without abnormal findings: Secondary | ICD-10-CM | POA: Diagnosis not present

## 2022-10-23 DIAGNOSIS — M8580 Other specified disorders of bone density and structure, unspecified site: Secondary | ICD-10-CM | POA: Diagnosis not present

## 2022-10-23 DIAGNOSIS — J45909 Unspecified asthma, uncomplicated: Secondary | ICD-10-CM | POA: Diagnosis not present

## 2022-10-23 DIAGNOSIS — E785 Hyperlipidemia, unspecified: Secondary | ICD-10-CM | POA: Diagnosis not present

## 2022-11-21 DIAGNOSIS — L814 Other melanin hyperpigmentation: Secondary | ICD-10-CM | POA: Diagnosis not present

## 2023-01-17 DIAGNOSIS — C50411 Malignant neoplasm of upper-outer quadrant of right female breast: Secondary | ICD-10-CM | POA: Diagnosis not present

## 2023-01-22 ENCOUNTER — Telehealth: Payer: Self-pay | Admitting: Sports Medicine

## 2023-01-22 ENCOUNTER — Ambulatory Visit
Admission: RE | Admit: 2023-01-22 | Discharge: 2023-01-22 | Disposition: A | Discharge status: Home / Self Care | Payer: Medicare Other | Source: Ambulatory Visit | Attending: Sports Medicine | Admitting: Sports Medicine

## 2023-01-22 DIAGNOSIS — M25561 Pain in right knee: Secondary | ICD-10-CM

## 2023-01-22 DIAGNOSIS — M1711 Unilateral primary osteoarthritis, right knee: Secondary | ICD-10-CM | POA: Diagnosis not present

## 2023-01-22 DIAGNOSIS — M25461 Effusion, right knee: Secondary | ICD-10-CM | POA: Diagnosis not present

## 2023-01-22 NOTE — Addendum Note (Signed)
Addended by: Merrilyn Puma on: 01/22/2023 11:52 AM   Modules accepted: Orders

## 2023-01-22 NOTE — Telephone Encounter (Signed)
Please note that this is a telephone note  Patient presents today complaining of medial sided right knee pain that is present when ambulating uphill or downhill.  No injury.  Physical exam shows full range of motion with no effusion.  She is tender to palpation along the medial joint line.  Negative Thessaly's.  I recommended that we get some x-rays of the right knee to evaluate the degree of DJD present.  I also recommended formal physical therapy (she has done well with PT in the past.  If symptoms persist then consider merits of cortisone injection.

## 2023-01-24 ENCOUNTER — Ambulatory Visit: Payer: Medicare Other

## 2023-01-29 DIAGNOSIS — H524 Presbyopia: Secondary | ICD-10-CM | POA: Diagnosis not present

## 2023-01-29 DIAGNOSIS — H04123 Dry eye syndrome of bilateral lacrimal glands: Secondary | ICD-10-CM | POA: Diagnosis not present

## 2023-01-29 DIAGNOSIS — H40013 Open angle with borderline findings, low risk, bilateral: Secondary | ICD-10-CM | POA: Diagnosis not present

## 2023-01-29 DIAGNOSIS — H25813 Combined forms of age-related cataract, bilateral: Secondary | ICD-10-CM | POA: Diagnosis not present

## 2023-01-29 DIAGNOSIS — H35033 Hypertensive retinopathy, bilateral: Secondary | ICD-10-CM | POA: Diagnosis not present

## 2023-02-05 ENCOUNTER — Telehealth: Payer: Self-pay | Admitting: Sports Medicine

## 2023-02-05 DIAGNOSIS — C50411 Malignant neoplasm of upper-outer quadrant of right female breast: Secondary | ICD-10-CM | POA: Diagnosis not present

## 2023-02-05 NOTE — Telephone Encounter (Signed)
Please note that this is not a telephone note.  I discussed x-ray results of her right knee with her today when she was in the office for her husband's appointment.  She has some moderate degenerative changes.  She is scheduled to start physical therapy tomorrow and have asked her to follow-up with me again in 4 weeks.  We briefly discussed merits of cortisone injection if physical therapy is ineffective.

## 2023-02-06 ENCOUNTER — Other Ambulatory Visit: Payer: Self-pay

## 2023-02-06 ENCOUNTER — Ambulatory Visit: Payer: Medicare Other | Attending: Sports Medicine

## 2023-02-06 DIAGNOSIS — M25561 Pain in right knee: Secondary | ICD-10-CM | POA: Diagnosis not present

## 2023-02-06 DIAGNOSIS — R262 Difficulty in walking, not elsewhere classified: Secondary | ICD-10-CM | POA: Insufficient documentation

## 2023-02-06 DIAGNOSIS — M6281 Muscle weakness (generalized): Secondary | ICD-10-CM | POA: Diagnosis not present

## 2023-02-06 NOTE — Therapy (Signed)
OUTPATIENT PHYSICAL THERAPY LOWER EXTREMITY EVALUATION   Patient Name: Gina Johns MRN: [id number] DOB:1955/10/30, 67 y.o., female Today's Date: 02/07/2023  END OF SESSION:  PT End of Session - 02/06/23 1040     Visit Number 1    Number of Visits 13    Date for PT Re-Evaluation 03/29/23    Authorization Type [employer] MEDICARE    PT Start Time 1025    PT Stop Time 1110    PT Time Calculation (min) 45 min    Activity Tolerance Patient tolerated treatment well    Behavior During Therapy WFL for tasks assessed/performed             Past Medical History:  Diagnosis Date   Anxiety    Arthritis    Breast cancer (HCC) 12/07/2011   right breast 2x3 cm   Depression    " sometimes"   Hypercholesterolemia    PMH   Right knee pain 08/26/2012   Past Surgical History:  Procedure Laterality Date   BREAST LUMPECTOMY WITH AXILLARY LYMPH NODE BIOPSY     CESAREAN SECTION     Hx: of times 2   MASS EXCISION Right 02/25/2014   Procedure: EXCISION MASS RIGHT CHEST WALL;  Surgeon: Chevis Pretty III, MD;  Location: [facility] OR;  Service: General;  Laterality: Right;   MASTECTOMY Right    TOTAL MASTECTOMY Right 03/20/2013   Procedure: TOTAL MASTECTOMY;  Surgeon: Robyne Askew, MD;  Location: [facility] OR;  Service: General;  Laterality: Right;   Patient Active Problem List   Diagnosis Date Noted   Memory loss 06/10/2018   Depression with anxiety 06/10/2018   Low serum vitamin D 06/10/2018   Osteopenia determined by x-ray 04/05/2016   Upper airway cough syndrome 10/13/2015   Osteopenia 02/22/2014   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   Chondromalacia of patella, 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   Lymphedema 10/19/2013   Breast cancer of upper-outer quadrant of right female breast (HCC) 03/06/2013   Dyslipidemia 01/22/2013   Right knee pain 08/26/2012    PCP: Chilton Greathouse, MD  REFERRING PROVIDER: Ralene Cork, DO  REFERRING DIAG: [phone] (ICD-10-CM) - Acute pain of right knee   THERAPY DIAG:  Acute pain of  right knee  Muscle weakness (generalized)  Difficulty in walking, not elsewhere classified  Rationale for Evaluation and Treatment: Rehabilitation  ONSET DATE: approx 1 month ago  SUBJECTIVE:   SUBJECTIVE STATEMENT: Pt reports her R knee started bothering her approx 1 month ago without a precipitating event.  PERTINENT HISTORY: Arthritis  PAIN:  Are you having pain? Yes: NPRS scale: 0/10 Pain location: ant R knee, lat inf patella pole  Pain description: ache Aggravating factors: walking and steps Relieving factors: pain does not usually last that long   Pain range on eval=0-8/10  PRECAUTIONS: None  RED FLAGS: None   WEIGHT BEARING RESTRICTIONS: No  FALLS:  Has patient fallen in last 6 months? No  LIVING ENVIRONMENT: Lives with: lives with their family Lives in: House/apartment No issue c accessing or mobility within home. 13 steps in home  OCCUPATION: Part-time as Research scientist (life sciences)  PLOF: Independent  PATIENT GOALS: less pain  NEXT MD VISIT: 03/12/23 Dr. Herminio Heads  OBJECTIVE:   DIAGNOSTIC FINDINGS: DG R knee 01/22/23 FINDINGS: Minimal medial tibiofemoral joint space narrowing. There is mild to moderate tricompartmental peripheral spurring. Small knee joint effusion. Fragmented osteophyte about the lateral patella. No fracture or erosive change. No focal soft tissue abnormalities.   IMPRESSION: Mild-to-moderate tricompartmental osteoarthritis with small joint effusion.  PATIENT SURVEYS:  FOTO: Perceived function   56%, predicted   68%   COGNITION: Overall cognitive status: Within functional limits for tasks assessed     SENSATION: WFL  EDEMA:  None palpated or observed  MUSCLE LENGTH: Hamstrings: Right min tight deg; Left min tight deg Thomas test: Right NT deg; Left NT deg  POSTURE: No Significant postural limitations  PALPATION: Medial joint space, lateral inf patella pole, vastus lateralis/ITB  LOWER EXTREMITY ROM:  Active ROM  Right eval Left eval  Hip flexion    Hip extension    Hip abduction    Hip adduction    Hip internal rotation    Hip external rotation    Knee flexion WNLs WNLs  Knee extension WNLs WNLs  Ankle dorsiflexion 5 10  Ankle plantarflexion    Ankle inversion    Ankle eversion     (Blank rows = not tested)  LOWER EXTREMITY MMT:  MMT Right eval Left eval  Hip flexion 5 5  Hip extension 5 5  Hip abduction    Hip adduction 5 5  Hip internal rotation    Hip external rotation 5 5  Knee flexion 5 5  Knee extension 5 5  Ankle dorsiflexion    Ankle plantarflexion    Ankle inversion    Ankle eversion     (Blank rows = not tested)  LOWER EXTREMITY SPECIAL TESTS:  Knee special tests: Anterior drawer test: negative, Posterior drawer test: negative, McMurray's test: negative, Patellafemoral apprehension test: negative, Patellafemoral grind test: negative, and Patella tap test (ballotable patella): negative Pain of the R lateral thigh was provoked with R quad set  FUNCTIONAL TESTS:  5 times sit to stand: 10.8 " s use of arm rests WNLS  GAIT: Distance walked: 200" Assistive device utilized: None Level of assistance: Complete Independence Comments: WNLs, pt was not experiencing pain   TODAY'S TREATMENT:   [facility] Adult PT Treatment:                                                DATE: 02/06/23 Therapeutic Exercise: Developed, instructed in, and pt completed therex as noted in HEP  Self Care: RICE for management  of symptoms; Instruction in step to pattern for steps to reduce pain                                                                                                                           PATIENT EDUCATION:  Education details: Eval findings, POC, HEP, self care  Person educated: Patient and Spouse Education method: Explanation, Demonstration, Tactile cues, Verbal cues, and Handouts Education comprehension: verbalized understanding, returned demonstration, verbal cues  required, tactile cues required, and needs further education  HOME EXERCISE PROGRAM: Access Code: [email or web address] URL: [email or web address] Date: 02/06/2023 Prepared by: Joellyn Rued  Exercises - Supine Hamstring Stretch with Strap  -  2 x daily - 7 x weekly - 1 sets - 3 reps - 30 hold - Supine ITB Stretch with Strap  - 2 x daily - 7 x weekly - 1 sets - 3 reps - 30 hold - Seated Piriformis Stretch (Mirrored)  - 1 x daily - 7 x weekly - 1 sets - 3 reps - 30 hold - Gastroc Stretch on Wall (Mirrored)  - 1 x daily - 7 x weekly - 1 sets - 3 reps - 30 hold  ASSESSMENT:  CLINICAL IMPRESSION: Patient is a 67 y.o. female who was seen today for physical therapy evaluation and treatment for M25.561 (ICD-10-CM) - Acute pain of right knee . Pt presents with a recent onset of intermittent R knee pain. Above xrays show degenerative changes of the R knee.Eval revealed good R knee and hip strength, good R knee ROM, an appropriate 5xSTS time, lateral thigh pain with the completion of a quad set, min tight hamstrings , and decreased ankle DF R in comparison to the L. A HEP was initiated. Pt will benefit from skilled PT to address impairments to optimize function of the R knee with less pain.   OBJECTIVE IMPAIRMENTS: decreased activity tolerance, difficulty walking, decreased ROM, impaired flexibility, and pain.   ACTIVITY LIMITATIONS: squatting, stairs, and locomotion level  PARTICIPATION LIMITATIONS: cleaning, shopping, and community activity  PERSONAL FACTORS: Time since onset of injury/illness/exacerbation are also affecting patient's functional outcome.   REHAB POTENTIAL: Excellent  CLINICAL DECISION MAKING: Stable/uncomplicated  EVALUATION COMPLEXITY: Low   GOALS:   SHORT TERM GOALS=LTGs   LONG TERM GOALS: Target date: 03/29/23  Pt will be Ind in a final HEP to maintain achieved LOF  Baseline: started Goal status: INITIAL  2.  Pt will voice understanding of measures to assist in  pain reduction  Baseline: started Goal status: INITIAL  3.  Increased R DF to 10d for improved mechanics with gait Baseline: 5d Goal status: INITIAL  4.  Pt will report a 50% or greater decrease in R knee pain for improved function and QOL Baseline: 0-8/10 Goal status: INITIAL  5.  Pt's FOTO score will improved to the predicted value of 68% as indication of improved function  Baseline: 56% Goal status: INITIAL  6.  Assess and will improve by MCID of 22ft as indication of improved functional mobility if a deficit is found Baseline: TBA Goal status: INITIAL   PLAN:  PT FREQUENCY: 2x/week  PT DURATION: 6 weeks  PLANNED INTERVENTIONS: Therapeutic exercises, Therapeutic activity, Neuromuscular re-education, Balance training, Gait training, Patient/Family education, Self Care, Joint mobilization, Stair training, Aquatic Therapy, Dry Needling, Electrical stimulation, Cryotherapy, Moist heat, Taping, Vasopneumatic device, Ultrasound, Ionotophoresis 4mg /ml Dexamethasone, Manual therapy, and Re-evaluation  PLAN FOR NEXT SESSION: Assess ; Review FOTO; assess response to HEP; progress therex as indicated; use of modalities, manual therapy; and TPDN as indicated.  Varnell Orvis MS, PT 02/08/23 6:02 AM

## 2023-02-08 ENCOUNTER — Encounter: Payer: Self-pay | Admitting: Physical Therapy

## 2023-02-08 ENCOUNTER — Ambulatory Visit: Payer: Medicare Other | Admitting: Physical Therapy

## 2023-02-08 DIAGNOSIS — M6281 Muscle weakness (generalized): Secondary | ICD-10-CM | POA: Diagnosis not present

## 2023-02-08 DIAGNOSIS — R262 Difficulty in walking, not elsewhere classified: Secondary | ICD-10-CM

## 2023-02-08 DIAGNOSIS — M25561 Pain in right knee: Secondary | ICD-10-CM | POA: Diagnosis not present

## 2023-02-08 NOTE — Therapy (Signed)
OUTPATIENT PHYSICAL THERAPY TREATMENT   Patient Name: Gina Johns MRN: [id number] DOB:[birth year], 67 y.o., female Today's Date: 02/08/2023  END OF SESSION:  PT End of Session - 02/08/23 0939     Visit Number 2    Number of Visits 13    Date for PT Re-Evaluation 03/29/23    Authorization Type [employer] MEDICARE    PT Start Time 0935    PT Stop Time 1015    PT Time Calculation (min) 40 min    Activity Tolerance Patient tolerated treatment well    Behavior During Therapy [facility] for tasks assessed/performed              Past Medical History:  Diagnosis Date   Anxiety    Arthritis    Breast cancer (HCC) 12/07/2011   right breast 2x3 cm   Depression    " sometimes"   Hypercholesterolemia    PMH   Right knee pain 08/26/2012   Past Surgical History:  Procedure Laterality Date   BREAST LUMPECTOMY WITH AXILLARY LYMPH NODE BIOPSY     CESAREAN SECTION     Hx: of times 2   MASS EXCISION Right 02/25/2014   Procedure: EXCISION MASS RIGHT CHEST WALL;  Surgeon: Chevis Pretty III, MD;  Location: [facility] OR;  Service: General;  Laterality: Right;   MASTECTOMY Right    TOTAL MASTECTOMY Right 03/20/2013   Procedure: TOTAL MASTECTOMY;  Surgeon: Robyne Askew, MD;  Location: [facility] OR;  Service: General;  Laterality: Right;   Patient Active Problem List   Diagnosis Date Noted   Memory loss 06/10/2018   Depression with anxiety 06/10/2018   Low serum vitamin D 06/10/2018   Osteopenia determined by x-ray 04/05/2016   Upper airway cough syndrome 10/13/2015   Osteopenia 02/22/2014   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   Chondromalacia of patella, 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   Lymphedema 10/19/2013   Breast cancer of upper-outer quadrant of right female breast (HCC) 03/06/2013   Dyslipidemia 01/22/2013   Right knee pain 08/26/2012    PCP: Chilton Greathouse, MD  REFERRING PROVIDER: Ralene Cork, DO  REFERRING DIAG: [phone] (ICD-10-CM) - Acute pain of right knee   THERAPY DIAG:  Acute pain of right  knee  Difficulty in walking, not elsewhere classified  Muscle weakness (generalized)  Rationale for Evaluation and Treatment: Rehabilitation  ONSET DATE: approx 1 month ago  SUBJECTIVE:   SUBJECTIVE STATEMENT: I walked yesterday about 40 min It bothered my a little bit. When there is an incline or decline it hurts.    PERTINENT HISTORY: Arthritis  PAIN:  Are you having pain? Yes: NPRS scale: 2/10 Pain location: ant R knee, lat inf patella pole  Pain description: ache Aggravating factors: walking and steps Relieving factors: pain does not usually last that long   Pain range on eval=0-8/10  PRECAUTIONS: None  RED FLAGS: None   WEIGHT BEARING RESTRICTIONS: No  FALLS:  Has patient fallen in last 6 months? No  LIVING ENVIRONMENT: Lives with: lives with their family Lives in: House/apartment No issue c accessing or mobility within home. 13 steps in home  OCCUPATION: Part-time as Research scientist (life sciences)  PLOF: Independent  PATIENT GOALS: less pain  NEXT MD VISIT: 03/12/23 Dr. Margaretha Sheffield  OBJECTIVE:   DIAGNOSTIC FINDINGS: DG R knee 01/22/23 FINDINGS: Minimal medial tibiofemoral joint space narrowing. There is mild to moderate tricompartmental peripheral spurring. Small knee joint effusion. Fragmented osteophyte about the lateral patella. No fracture or erosive change. No focal soft tissue abnormalities.   IMPRESSION: Mild-to-moderate  tricompartmental osteoarthritis with small joint effusion.  PATIENT SURVEYS:  FOTO: Perceived function   56%, predicted   68%   COGNITION: Overall cognitive status: Within functional limits for tasks assessed     SENSATION: WFL  EDEMA:  None palpated or observed  MUSCLE LENGTH: Hamstrings: Right min tight deg; Left min tight deg Thomas test: Right NT deg; Left NT deg  POSTURE: No Significant postural limitations  PALPATION: Medial joint space, lateral inf patella pole, vastus lateralis/ITB  LOWER EXTREMITY ROM:  Active  ROM Right eval Left eval  Hip flexion    Hip extension    Hip abduction    Hip adduction    Hip internal rotation    Hip external rotation    Knee flexion WNLs WNLs  Knee extension WNLs WNLs  Ankle dorsiflexion 5 10  Ankle plantarflexion    Ankle inversion    Ankle eversion     (Blank rows = not tested)  LOWER EXTREMITY MMT:  MMT Right eval Left eval  Hip flexion 5 5  Hip extension 5 5  Hip abduction    Hip adduction 5 5  Hip internal rotation    Hip external rotation 5 5  Knee flexion 5 5  Knee extension 5 5  Ankle dorsiflexion    Ankle plantarflexion    Ankle inversion    Ankle eversion     (Blank rows = not tested)  LOWER EXTREMITY SPECIAL TESTS:  Knee special tests: Anterior drawer test: negative, Posterior drawer test: negative, McMurray's test: negative, Patellafemoral apprehension test: negative, Patellafemoral grind test: negative, and Patella tap test (ballotable patella): negative Pain of the R lateral thigh was provoked with R quad set  FUNCTIONAL TESTS:  5 times sit to stand: 10.8 " s use of arm rests WNLS  GAIT: Distance walked: 200" Assistive device utilized: None Level of assistance: Complete Independence Comments: WNLs, pt was not experiencing pain   TODAY'S TREATMENT:    [facility] Adult PT Treatment:                                                DATE: 02/08/23 Therapeutic Exercise: Recumbent bike 6 min L3 level  Seated piriformis 45 sec x 1 each side Hamstring 30 sec x 2 ITB 30 sec x 2  SLR hip flexion, extension and abduction  Bridging   15  Singel leg x 10 each sit to stand x 15 Squat x 15 with maximal cues for hip hinge Self Care: Walking program, acupuncture vs dry needling, HEP cues for technique , squat form to reduce knee force  [facility] Adult PT Treatment:                                                DATE: 02/06/23 Therapeutic Exercise: Developed, instructed in, and pt completed therex as noted in HEP  Self Care: RICE for  management  of symptoms; Instruction in step to pattern for steps to reduce pain  PATIENT EDUCATION:  Education details: Eval findings, POC, HEP, self care  Person educated: Patient and Spouse Education method: Explanation, Demonstration, Tactile cues, Verbal cues, and Handouts Education comprehension: verbalized understanding, returned demonstration, verbal cues required, tactile cues required, and needs further education  HOME EXERCISE PROGRAM: Access Code: [email or web address] URL: [email or web address] Date: 02/08/2023 Prepared by: Karie Mainland  Exercises - Supine Hamstring Stretch with Strap  - 2 x daily - 7 x weekly - 1 sets - 3 reps - 30 hold - Supine ITB Stretch with Strap  - 2 x daily - 7 x weekly - 1 sets - 3 reps - 30 hold - Seated Piriformis Stretch (Mirrored)  - 1 x daily - 7 x weekly - 1 sets - 3 reps - 30 hold - Gastroc Stretch on Wall (Mirrored)  - 1 x daily - 7 x weekly - 1 sets - 3 reps - 30 hold - Active Straight Leg Raise with Quad Set  - 1 x daily - 7 x weekly - 2 sets - 10 reps - 5 hold - Sidelying Hip Abduction  - 1 x daily - 7 x weekly - 2 sets - 10 reps - 5 hold - Supine Bridge  - 1 x daily - 7 x weekly - 2 sets - 10 reps - 5 hold   ASSESSMENT:  CLINICAL IMPRESSION: Patient did well today with exercises given on evaluation.  Added couple of strengthening exercises per request.  Patient needed maximal cueing to perform a squat due to poor motor planning.  She can easily perform sit to stand transfer.  Patient insist that she could use trigger point dry needling and would like to see if that would help.  She may rework her schedule for next week in order to do that.  OBJECTIVE IMPAIRMENTS: decreased activity tolerance, difficulty walking, decreased ROM, impaired flexibility, and pain.   ACTIVITY LIMITATIONS: squatting, stairs, and locomotion  level  PARTICIPATION LIMITATIONS: cleaning, shopping, and community activity  PERSONAL FACTORS: Time since onset of injury/illness/exacerbation are also affecting patient's functional outcome.   REHAB POTENTIAL: Excellent  CLINICAL DECISION MAKING: Stable/uncomplicated  EVALUATION COMPLEXITY: Low   GOALS:   SHORT TERM GOALS=LTGs   LONG TERM GOALS: Target date: 03/29/23  Pt will be Ind in a final HEP to maintain achieved LOF  Baseline: started Goal status: INITIAL  2.  Pt will voice understanding of measures to assist in pain reduction  Baseline: started Goal status: INITIAL  3.  Increased R DF to 10d for improved mechanics with gait Baseline: 5d Goal status: INITIAL  4.  Pt will report a 50% or greater decrease in R knee pain for improved function and QOL Baseline: 0-8/10 Goal status: INITIAL  5.  Pt's FOTO score will improved to the predicted value of 68% as indication of improved function  Baseline: 56% Goal status: INITIAL  6.  Assess and will improve by MCID of 56ft as indication of improved functional mobility if a deficit is found Baseline: TBA Goal status: INITIAL   PLAN:  PT FREQUENCY: 2x/week  PT DURATION: 6 weeks  PLANNED INTERVENTIONS: Therapeutic exercises, Therapeutic activity, Neuromuscular re-education, Balance training, Gait training, Patient/Family education, Self Care, Joint mobilization, Stair training, Aquatic Therapy, Dry Needling, Electrical stimulation, Cryotherapy, Moist heat, Taping, Vasopneumatic device, Ultrasound, Ionotophoresis 4mg /ml Dexamethasone, Manual therapy, and Re-evaluation  PLAN FOR NEXT SESSION: Assess ; Review FOTO; assess response to HEP; progress therex as indicated; use of modalities, manual therapy; and TPDN as indicated.  Karie Mainland, PT 02/08/23 10:18  AM Phone: [phone] Fax: [phone]

## 2023-02-11 ENCOUNTER — Ambulatory Visit: Payer: Medicare Other | Admitting: Sports Medicine

## 2023-02-12 ENCOUNTER — Encounter: Payer: Self-pay | Admitting: Physical Therapy

## 2023-02-12 ENCOUNTER — Ambulatory Visit: Payer: Medicare Other | Admitting: Physical Therapy

## 2023-02-12 DIAGNOSIS — M25561 Pain in right knee: Secondary | ICD-10-CM

## 2023-02-12 DIAGNOSIS — M6281 Muscle weakness (generalized): Secondary | ICD-10-CM

## 2023-02-12 DIAGNOSIS — R262 Difficulty in walking, not elsewhere classified: Secondary | ICD-10-CM | POA: Diagnosis not present

## 2023-02-12 NOTE — Therapy (Signed)
OUTPATIENT PHYSICAL THERAPY TREATMENT   Patient Name: Gina Johns MRN: [id number] DOB:08-Feb-1956, 67 y.o., female Today's Date: 02/12/2023  END OF SESSION:  PT End of Session - 02/12/23 1021     Visit Number 3    Number of Visits 13    Date for PT Re-Evaluation 03/29/23    Authorization Type BCBS MEDICARE    PT Start Time 1017    PT Stop Time 1100    PT Time Calculation (min) 43 min    Activity Tolerance Patient tolerated treatment well    Behavior During Therapy WFL for tasks assessed/performed               Past Medical History:  Diagnosis Date   Anxiety    Arthritis    Breast cancer (HCC) 12/07/2011   right breast 2x3 cm   Depression    " sometimes"   Hypercholesterolemia    PMH   Right knee pain 08/26/2012   Past Surgical History:  Procedure Laterality Date   BREAST LUMPECTOMY WITH AXILLARY LYMPH NODE BIOPSY     CESAREAN SECTION     Hx: of times 2   MASS EXCISION Right 02/25/2014   Procedure: EXCISION MASS RIGHT CHEST WALL;  Surgeon: Chevis Pretty III, MD;  Location: [facility] OR;  Service: General;  Laterality: Right;   MASTECTOMY Right    TOTAL MASTECTOMY Right 03/20/2013   Procedure: TOTAL MASTECTOMY;  Surgeon: Robyne Askew, MD;  Location: [facility] OR;  Service: General;  Laterality: Right;   Patient Active Problem List   Diagnosis Date Noted   Memory loss 06/10/2018   Depression with anxiety 06/10/2018   Low serum vitamin D 06/10/2018   Osteopenia determined by x-ray 04/05/2016   Upper airway cough syndrome 10/13/2015   Osteopenia 02/22/2014   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   Chondromalacia of patella, 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  Right now it is OK 3/10.  I think this may be a part of my life.  I walked this weekend, pain after walsing 40 min . No improvement yet.   PERTINENT HISTORY: Arthritis  PAIN:  Are you having pain? Yes: NPRS scale: 3/10 Pain location: ant R knee, lat inf patella pole  Pain description: ache Aggravating factors: walking and steps Relieving factors: pain does not usually last that long   Pain range on eval=0-8/10  PRECAUTIONS: None  RED FLAGS: None   WEIGHT BEARING RESTRICTIONS: No  FALLS:  Has patient fallen in last 6 months? No  LIVING ENVIRONMENT: Lives with: lives with their family Lives in: House/apartment No issue c accessing or mobility within home. 13 steps in home  OCCUPATION: Part-time as Research scientist (life sciences)  PLOF: Independent  PATIENT GOALS: less pain  NEXT MD VISIT: 03/12/23 Dr. Margaretha Sheffield  OBJECTIVE:   DIAGNOSTIC FINDINGS: DG R knee 01/22/23 FINDINGS: Minimal medial tibiofemoral joint space narrowing. There is mild to moderate tricompartmental peripheral spurring. Small knee joint effusion. Fragmented osteophyte about the lateral patella. No fracture or  erosive change. No focal soft tissue abnormalities.   IMPRESSION: Mild-to-moderate tricompartmental osteoarthritis with small joint effusion.  PATIENT SURVEYS:  FOTO: Perceived function   56%, predicted   68%   COGNITION: Overall cognitive status: Within functional limits for tasks assessed     SENSATION: WFL  EDEMA:  None palpated or observed  MUSCLE LENGTH: Hamstrings: Right min tight deg; Left min tight deg Thomas test: Right NT deg; Left NT deg  POSTURE: No Significant postural limitations  PALPATION: Medial joint space, lateral inf patella pole, vastus lateralis/ITB  LOWER  EXTREMITY ROM:  Active ROM Right eval Left eval  Hip flexion    Hip extension    Hip abduction    Hip adduction    Hip internal rotation    Hip external rotation    Knee flexion WNLs WNLs  Knee extension WNLs WNLs  Ankle dorsiflexion 5 10  Ankle plantarflexion    Ankle inversion    Ankle eversion     (Blank rows = not tested)  LOWER EXTREMITY MMT:  MMT Right eval Left eval  Hip flexion 5 5  Hip extension 5 5  Hip abduction    Hip adduction 5 5  Hip internal rotation    Hip external rotation 5 5  Knee flexion 5 5  Knee extension 5 5  Ankle dorsiflexion    Ankle plantarflexion    Ankle inversion    Ankle eversion     (Blank rows = not tested)  LOWER EXTREMITY SPECIAL TESTS:  Knee special tests: Anterior drawer test: negative, Posterior drawer test: negative, McMurray's test: negative, Patellafemoral apprehension test: negative, Patellafemoral grind test: negative, and Patella tap test (ballotable patella): negative Pain of the R lateral thigh was provoked with R quad set  FUNCTIONAL TESTS:  5 times sit to stand: 10.8 " s use of arm rests WNLS  GAIT: Distance walked: 200" Assistive device utilized: None Level of assistance: Complete Independence Comments: WNLs, pt was not experiencing pain   TODAY'S TREATMENT:    [facility] Adult PT Treatment:                                                DATE: 02/12/23 Therapeutic Exercise: Nustep  5 min, L6 UE and LE Supine Hamstring Stretch with Strap   3 x 20 sec  Supine ITB Stretch with Strap  3 x 20 sec  Seated Piriformis Stretch 2 x 1 min  Active Straight Leg Raise with Quad Set x 10 , 2 sets 1 set with hip ER  Sidelying Hip Abduction  x 10 x 2 sets  Supine Bridge x 15 Calf stretch 45 sec x 1 each side  Wall squats x 10 mid range  Isometric hold 20 sec  Active quad stretch x 30 sec   Therapeutic Activity: 6-min walk 1815 feet    [facility] Adult PT Treatment:                                                DATE:  02/08/23 Therapeutic Exercise: Recumbent bike 6 min L3 level  Seated piriformis 45 sec x 1 each side Hamstring 30 sec x 2 ITB 30 sec x 2  SLR hip flexion, extension and abduction  Bridging x  15  Singel leg x 10 each sit to stand x 15 Squat x 15 with maximal cues for hip hinge Self Care: Walking program, acupuncture vs dry needling, HEP cues for technique , squat form to reduce knee force  [facility] Adult PT Treatment:                                                DATE: 02/06/23 Therapeutic Exercise: Developed, instructed in, and pt completed therex as noted in HEP  Self Care: RICE for management  of symptoms; Instruction in step to pattern for steps to reduce pain                                                                                                                           PATIENT EDUCATION:  Education details: Eval findings, POC, HEP, self care  Person educated: Patient and Spouse Education method: Explanation, Demonstration, Tactile cues, Verbal cues, and Handouts Education comprehension: verbalized understanding, returned demonstration, verbal cues required, tactile cues required, and needs further education  HOME EXERCISE PROGRAM: Access Code: [email or web address] URL: [email or web address] Date: 02/08/2023 Prepared by: Karie Mainland  Exercises - Supine Hamstring Stretch with Strap  - 2 x daily - 7 x weekly - 1 sets - 3 reps - 30 hold - Supine ITB Stretch with Strap  - 2 x daily - 7 x weekly - 1 sets - 3 reps - 30 hold - Seated Piriformis Stretch (Mirrored)  - 1 x daily - 7 x weekly - 1 sets - 3 reps - 30 hold - Gastroc Stretch on Wall (Mirrored)  - 1 x daily - 7 x weekly - 1 sets - 3 reps - 30 hold - Active Straight Leg Raise with Quad Set  - 1 x daily - 7 x weekly - 2 sets - 10 reps - 5 hold - Sidelying Hip Abduction  - 1 x daily - 7 x weekly - 2 sets - 10 reps - 5 hold - Supine Bridge  - 1 x daily - 7 x weekly - 2 sets - 10 reps - 5  hold   ASSESSMENT:  CLINICAL IMPRESSION: Patient showing good tolerance for exercise, she fatigues quickly with SLR in flexion and sidelying. She was able to walk 1800 feet in 6 min indicating and excellent baseline.  She walks a little bit slower than this on her neighborhood walking. She will cont to benefit from skiled PT to reduce pain in Rt knee with standing activities and understand form with squats, lifting.   OBJECTIVE IMPAIRMENTS: decreased activity tolerance, difficulty walking, decreased ROM, impaired flexibility, and pain.   ACTIVITY LIMITATIONS: squatting, stairs, and locomotion level  PARTICIPATION LIMITATIONS: cleaning, shopping, and community activity  PERSONAL FACTORS: Time since onset of injury/illness/exacerbation are also affecting patient's functional outcome.   REHAB POTENTIAL: Excellent  CLINICAL DECISION MAKING: Stable/uncomplicated  EVALUATION COMPLEXITY: Low   GOALS:   SHORT TERM GOALS=LTGs   LONG TERM GOALS: Target date: 03/29/23  Pt will be Ind in a final HEP to maintain achieved LOF  Baseline: started Goal status: INITIAL  2.  Pt will voice understanding of measures to assist in pain reduction  Baseline: started Goal status: INITIAL  3.  Increased R DF to 10d for improved mechanics with gait Baseline: 5d Goal status: INITIAL  4.  Pt will report a 50% or greater decrease in R knee pain for improved function and QOL Baseline: 0-8/10 Goal status: INITIAL  5.  Pt's FOTO score will improved to the predicted value of 68% as indication of improved function  Baseline: 56% Goal status: INITIAL  6.  Assess and will improve by MCID of 73ft as indication of improved functional mobility if a deficit is found Baseline: 6 min walk 1800 feet +, goal deferred  Goal status: MET, DC GOAL    PLAN:  PT FREQUENCY: 2x/week  PT DURATION: 6 weeks  PLANNED INTERVENTIONS: Therapeutic exercises, Therapeutic activity, Neuromuscular re-education,  Balance training, Gait training, Patient/Family education, Self Care, Joint mobilization, Stair training, Aquatic Therapy, Dry Needling, Electrical stimulation, Cryotherapy, Moist heat, Taping, Vasopneumatic device, Ultrasound, Ionotophoresis 4mg /ml Dexamethasone, Manual therapy, and Re-evaluation  PLAN FOR NEXT SESSION: squat form , lifting mech. Review FOTO; assess response to HEP; progress therex as indicated; use of modalities, manual therapy; and TPDN as indicated.  Karie Mainland, PT 02/12/23 11:05 AM Phone: [phone] Fax: [phone]

## 2023-02-14 ENCOUNTER — Ambulatory Visit: Payer: Medicare Other | Admitting: Physical Therapy

## 2023-02-14 ENCOUNTER — Encounter: Payer: Self-pay | Admitting: Physical Therapy

## 2023-02-14 DIAGNOSIS — M6281 Muscle weakness (generalized): Secondary | ICD-10-CM

## 2023-02-14 DIAGNOSIS — M25561 Pain in right knee: Secondary | ICD-10-CM | POA: Diagnosis not present

## 2023-02-14 DIAGNOSIS — R262 Difficulty in walking, not elsewhere classified: Secondary | ICD-10-CM | POA: Diagnosis not present

## 2023-02-14 NOTE — Therapy (Signed)
OUTPATIENT PHYSICAL THERAPY TREATMENT   Patient Name: Gina Johns MRN: [id number] DOB:02/10/1956, 67 y.o., female Today's Date: 02/14/2023  END OF SESSION:  PT End of Session - 02/14/23 0853     Visit Number 4    Number of Visits 13    Date for PT Re-Evaluation 03/29/23    Authorization Type BCBS MEDICARE    PT Start Time [phone]    PT Stop Time 0930    PT Time Calculation (min) 43 min    Activity Tolerance Patient tolerated treatment well    Behavior During Therapy [facility] for tasks assessed/performed               Past Medical History:  Diagnosis Date   Anxiety    Arthritis    Breast cancer (HCC) 12/07/2011   right breast 2x3 cm   Depression    " sometimes"   Hypercholesterolemia    PMH   Right knee pain 08/26/2012   Past Surgical History:  Procedure Laterality Date   BREAST LUMPECTOMY WITH AXILLARY LYMPH NODE BIOPSY     CESAREAN SECTION     Hx: of times 2   MASS EXCISION Right 02/25/2014   Procedure: EXCISION MASS RIGHT CHEST WALL;  Surgeon: Chevis Pretty III, MD;  Location: [facility] OR;  Service: General;  Laterality: Right;   MASTECTOMY Right    TOTAL MASTECTOMY Right 03/20/2013   Procedure: TOTAL MASTECTOMY;  Surgeon: Robyne Askew, MD;  Location: [facility] OR;  Service: General;  Laterality: Right;   Patient Active Problem List   Diagnosis Date Noted   Memory loss 06/10/2018   Depression with anxiety 06/10/2018   Low serum vitamin D 06/10/2018   Osteopenia determined by x-ray 04/05/2016   Upper airway cough syndrome 10/13/2015   Osteopenia 02/22/2014   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   Chondromalacia of patella, 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  I did not walk yesterday so I did not have the pain .   PERTINENT HISTORY: Arthritis  PAIN:  Are you having pain? Yes: NPRS scale: 0/10 Pain location: ant R knee, lat inf patella pole  Pain description: ache Aggravating factors: walking and steps Relieving factors: pain does not usually last that long   Pain range on eval=0-8/10  PRECAUTIONS: None  RED FLAGS: None   WEIGHT BEARING RESTRICTIONS: No  FALLS:  Has patient fallen in last 6 months? No  LIVING ENVIRONMENT: Lives with: lives with their family Lives in: House/apartment No issue c accessing or mobility within home. 13 steps in home  OCCUPATION: Part-time as Research scientist (life sciences)  PLOF: Independent  PATIENT GOALS: less pain  NEXT MD VISIT: 03/12/23 Dr. Margaretha Sheffield  OBJECTIVE:   DIAGNOSTIC FINDINGS: DG R knee 01/22/23 FINDINGS: Minimal medial tibiofemoral joint space narrowing. There is mild to moderate tricompartmental peripheral spurring. Small knee joint effusion. Fragmented osteophyte about the lateral patella. No fracture or erosive change. No focal soft tissue abnormalities.   IMPRESSION: Mild-to-moderate tricompartmental osteoarthritis with small  joint effusion.  PATIENT SURVEYS:  FOTO: Perceived function   56%, predicted   68%   COGNITION: Overall cognitive status: Within functional limits for tasks assessed     SENSATION: WFL  EDEMA:  None palpated or observed  MUSCLE LENGTH: Hamstrings: Right min tight deg; Left min tight deg Thomas test: Right NT deg; Left NT deg  POSTURE: No Significant postural limitations  PALPATION: Medial joint space, lateral inf patella pole, vastus lateralis/ITB  LOWER EXTREMITY ROM:  Active ROM Right eval Left eval  Hip flexion     Hip extension    Hip abduction    Hip adduction    Hip internal rotation    Hip external rotation    Knee flexion WNLs WNLs  Knee extension WNLs WNLs  Ankle dorsiflexion 5 10  Ankle plantarflexion    Ankle inversion    Ankle eversion     (Blank rows = not tested)  LOWER EXTREMITY MMT:  MMT Right eval Left eval  Hip flexion 5 5  Hip extension 5 5  Hip abduction    Hip adduction 5 5  Hip internal rotation    Hip external rotation 5 5  Knee flexion 5 5  Knee extension 5 5  Ankle dorsiflexion    Ankle plantarflexion    Ankle inversion    Ankle eversion     (Blank rows = not tested)  LOWER EXTREMITY SPECIAL TESTS:  Knee special tests: Anterior drawer test: negative, Posterior drawer test: negative, McMurray's test: negative, Patellafemoral apprehension test: negative, Patellafemoral grind test: negative, and Patella tap test (ballotable patella): negative Pain of the R lateral thigh was provoked with R quad set  FUNCTIONAL TESTS:  5 times sit to stand: 10.8 " s use of arm rests WNLS  GAIT: Distance walked: 200" Assistive device utilized: None Level of assistance: Complete Independence Comments: WNLs, pt was not experiencing pain   TODAY'S TREATMENT:     [facility] Adult PT Treatment:                                                DATE: 02/14/23 Therapeutic Exercise: NuStep L5 Ue and LE for 5 min  Sit to stand, cues for slow descent x 15  Squat hover 2 sec x 10 Lateral walking 3 sets x 10 steps  Standing hip abduction red band at ankle then hip x 15 x 2 each side  Supine red looped band: clam and then bridge with leg lift  SLR x 12-15 each LE x 2  Standing calf stretch on slantboard, double leg and single for post knee flexibility. Self Care: Importance of doing a challenging exercise to make progress   [facility] Adult PT Treatment:                                                DATE: 02/12/23 Therapeutic Exercise: Nustep  5 min, L6 UE and LE Supine Hamstring Stretch  with Strap   3 x 20 sec  Supine ITB Stretch with Strap  3 x 20 sec  Seated Piriformis Stretch 2 x 1 min  Active Straight Leg Raise with Quad Set x 10 , 2 sets 1 set with hip ER  Sidelying Hip Abduction  x 10  x 2 sets  Supine Bridge x 15 Calf stretch 45 sec x 1 each side  Wall squats x 10 mid range  Isometric hold 20 sec  Active quad stretch x 30 sec   Therapeutic Activity: 6-min walk 1815 feet    [facility] Adult PT Treatment:                                                DATE: 02/08/23 Therapeutic Exercise: Recumbent bike 6 min L3 level  Seated piriformis 45 sec x 1 each side Hamstring 30 sec x 2 ITB 30 sec x 2  SLR hip flexion, extension and abduction  Bridging x 15  Singel leg x 10 each sit to stand x 15 Squat x 15 with maximal cues for hip hinge Self Care: Walking program, acupuncture vs dry needling, HEP cues for technique , squat form to reduce knee force  [facility] Adult PT Treatment:                                                DATE: 02/06/23 Therapeutic Exercise: Developed, instructed in, and pt completed therex as noted in HEP  Self Care: RICE for management  of symptoms; Instruction in step to pattern for steps to reduce pain                                                                                                                           PATIENT EDUCATION:  Education details: Eval findings, POC, HEP, self care  Person educated: Patient and Spouse Education method: Explanation, Demonstration, Tactile cues, Verbal cues, and Handouts Education comprehension: verbalized understanding, returned demonstration, verbal cues required, tactile cues required, and needs further education  HOME EXERCISE PROGRAM: Access Code: [email or web address] URL: [email or web address] Date: 02/08/2023 Prepared by: Karie Mainland  Exercises - Supine Hamstring Stretch with Strap  - 2 x daily - 7 x weekly - 1 sets - 3 reps - 30 hold - Supine ITB Stretch with Strap  - 2 x daily - 7 x  weekly - 1 sets - 3 reps - 30 hold - Seated Piriformis Stretch (Mirrored)  - 1 x daily - 7 x weekly - 1 sets - 3 reps - 30 hold - Gastroc Stretch on Wall (Mirrored)  - 1 x daily - 7 x weekly - 1 sets - 3 reps - 30 hold - Active Straight Leg Raise with Quad Set  - 1 x daily - 7 x weekly - 2 sets - 10 reps - 5 hold - Sidelying Hip Abduction  - 1 x daily - 7 x weekly - 2 sets - 10 reps - 5 hold - Supine Bridge  - 1 x daily - 7 x weekly -  2 sets - 10 reps - 5 hold   ASSESSMENT:  CLINICAL IMPRESSION: Patient needs encouragement during session to do the "harder" exercises.  Provided differentiation of pain vs strain vs fatigue.  She needed cues to perform her SLR given on HEP.  She will benefit from skilled PT to provide exercises that will ultimately improve her function and mobility. Reprinted HEP for clarity.   OBJECTIVE IMPAIRMENTS: decreased activity tolerance, difficulty walking, decreased ROM, impaired flexibility, and pain.   ACTIVITY LIMITATIONS: squatting, stairs, and locomotion level  PARTICIPATION LIMITATIONS: cleaning, shopping, and community activity  PERSONAL FACTORS: Time since onset of injury/illness/exacerbation are also affecting patient's functional outcome.   REHAB POTENTIAL: Excellent  CLINICAL DECISION MAKING: Stable/uncomplicated  EVALUATION COMPLEXITY: Low   GOALS:   SHORT TERM GOALS=LTGs   LONG TERM GOALS: Target date: 03/29/23  Pt will be Ind in a final HEP to maintain achieved LOF  Baseline: started Goal status: INITIAL  2.  Pt will voice understanding of measures to assist in pain reduction  Baseline: started Goal status: INITIAL  3.  Increased R DF to 10d for improved mechanics with gait Baseline: 5d Goal status: INITIAL  4.  Pt will report a 50% or greater decrease in R knee pain for improved function and QOL Baseline: 0-8/10 Goal status: INITIAL  5.  Pt's FOTO score will improved to the predicted value of 68% as indication of improved  function  Baseline: 56% Goal status: INITIAL  6.  Assess and will improve by MCID of 19ft as indication of improved functional mobility if a deficit is found Baseline: 6 min walk 1800 feet +, goal deferred  Goal status: MET, DC GOAL    PLAN:  PT FREQUENCY: 2x/week  PT DURATION: 6 weeks  PLANNED INTERVENTIONS: Therapeutic exercises, Therapeutic activity, Neuromuscular re-education, Balance training, Gait training, Patient/Family education, Self Care, Joint mobilization, Stair training, Aquatic Therapy, Dry Needling, Electrical stimulation, Cryotherapy, Moist heat, Taping, Vasopneumatic device, Ultrasound, Ionotophoresis 4mg /ml Dexamethasone, Manual therapy, and Re-evaluation  PLAN FOR NEXT SESSION: squat form , lifting mech. Review FOTO; assess response to HEP; progress therex as indicated; use of modalities, manual therapy; and TPDN as indicated.  Karie Mainland, PT 02/14/23 8:54 AM  Karie Mainland, PT 02/14/23 9:28 AM Phone: [phone] Fax: [phone]

## 2023-02-18 ENCOUNTER — Ambulatory Visit: Payer: Medicare Other | Admitting: Physical Therapy

## 2023-02-18 ENCOUNTER — Encounter: Payer: Self-pay | Admitting: Physical Therapy

## 2023-02-18 DIAGNOSIS — M25561 Pain in right knee: Secondary | ICD-10-CM

## 2023-02-18 DIAGNOSIS — M6281 Muscle weakness (generalized): Secondary | ICD-10-CM | POA: Diagnosis not present

## 2023-02-18 DIAGNOSIS — R262 Difficulty in walking, not elsewhere classified: Secondary | ICD-10-CM

## 2023-02-18 NOTE — Therapy (Signed)
OUTPATIENT PHYSICAL THERAPY TREATMENT   Patient Name: Gina Johns MRN: [id number] DOB:1955-12-15, 67 y.o., female Today's Date: 02/18/2023  END OF SESSION:  PT End of Session - 02/18/23 1507     Visit Number 5    Number of Visits 13    Date for PT Re-Evaluation 03/29/23    Authorization Type BCBS MEDICARE    PT Start Time 1505    PT Stop Time 1545    PT Time Calculation (min) 40 min    Activity Tolerance Patient tolerated treatment well    Behavior During Therapy WFL for tasks assessed/performed                Past Medical History:  Diagnosis Date   Anxiety    Arthritis    Breast cancer (HCC) 12/07/2011   right breast 2x3 cm   Depression    " sometimes"   Hypercholesterolemia    PMH   Right knee pain 08/26/2012   Past Surgical History:  Procedure Laterality Date   BREAST LUMPECTOMY WITH AXILLARY LYMPH NODE BIOPSY     CESAREAN SECTION     Hx: of times 2   MASS EXCISION Right 02/25/2014   Procedure: EXCISION MASS RIGHT CHEST WALL;  Surgeon: Chevis Pretty III, MD;  Location: [facility] OR;  Service: General;  Laterality: Right;   MASTECTOMY Right    TOTAL MASTECTOMY Right 03/20/2013   Procedure: TOTAL MASTECTOMY;  Surgeon: Robyne Askew, MD;  Location: [facility] OR;  Service: General;  Laterality: Right;   Patient Active Problem List   Diagnosis Date Noted   Memory loss 06/10/2018   Depression with anxiety 06/10/2018   Low serum vitamin D 06/10/2018   Osteopenia determined by x-ray 04/05/2016   Upper airway cough syndrome 10/13/2015   Osteopenia 02/22/2014   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   Chondromalacia of patella, 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  Micah Flesher out of town.  Pain can be 6/10 and then go away. Did the exercises a little.   PERTINENT HISTORY: Arthritis  PAIN:  Are you having pain? Yes: NPRS scale: 6/10 Pain location: ant R knee, lat inf patella pole  Pain description: ache Aggravating factors: walking and steps Relieving factors: pain does not usually last that long   Pain range on eval=0-8/10  PRECAUTIONS: None  RED FLAGS: None   WEIGHT BEARING RESTRICTIONS: No  FALLS:  Has patient fallen in last 6 months? No  LIVING ENVIRONMENT: Lives with: lives with their family Lives in: House/apartment No issue c accessing or mobility within home. 13 steps in home  OCCUPATION: Part-time as Research scientist (life sciences)  PLOF: Independent  PATIENT GOALS: less pain  NEXT MD VISIT: 03/12/23 Dr. Margaretha Sheffield  OBJECTIVE:   DIAGNOSTIC FINDINGS: DG R knee 01/22/23 FINDINGS: Minimal medial tibiofemoral joint space narrowing. There is mild to moderate tricompartmental peripheral spurring. Small knee joint effusion. Fragmented osteophyte about the lateral patella. No fracture or erosive change. No focal soft tissue abnormalities.  IMPRESSION: Mild-to-moderate tricompartmental osteoarthritis with small joint effusion.  PATIENT SURVEYS:  FOTO: Perceived function   56%, predicted   68%   COGNITION: Overall cognitive status: Within functional limits for tasks assessed     SENSATION: WFL  EDEMA:  None palpated or observed  MUSCLE LENGTH: Hamstrings: Right min tight deg; Left min tight deg Thomas test: Right NT deg; Left NT deg  POSTURE: No Significant postural limitations  PALPATION: Medial joint space, lateral inf patella pole, vastus lateralis/ITB  LOWER EXTREMITY ROM:  Active ROM  Right eval Left eval  Hip flexion    Hip extension    Hip abduction    Hip adduction    Hip internal rotation    Hip external rotation    Knee flexion WNLs WNLs  Knee extension WNLs WNLs  Ankle dorsiflexion 5 10  Ankle plantarflexion    Ankle inversion    Ankle eversion     (Blank rows = not tested)  LOWER EXTREMITY MMT:  MMT Right eval Left eval  Hip flexion 5 5  Hip extension 5 5  Hip abduction    Hip adduction 5 5  Hip internal rotation    Hip external rotation 5 5  Knee flexion 5 5  Knee extension 5 5  Ankle dorsiflexion    Ankle plantarflexion    Ankle inversion    Ankle eversion     (Blank rows = not tested)  LOWER EXTREMITY SPECIAL TESTS:  Knee special tests: Anterior drawer test: negative, Posterior drawer test: negative, McMurray's test: negative, Patellafemoral apprehension test: negative, Patellafemoral grind test: negative, and Patella tap test (ballotable patella): negative Pain of the R lateral thigh was provoked with R quad set  FUNCTIONAL TESTS:  5 times sit to stand: 10.8 " s use of arm rests WNLS  GAIT: Distance walked: 200" Assistive device utilized: None Level of assistance: Complete Independence Comments: WNLs, pt was not experiencing pain   TODAY'S TREATMENT:    [facility] Adult PT Treatment:                                                DATE: 02/18/23 Therapeutic Exercise: NuStep L5 UE and LE for 5 min  Calf stretch at Slant board 1 min  Hip abduction x 15  Squat x 10 mini at the bar slow mod cues  LAQ 4 lbs x 15 each LE x 2  SLR 2 x 10 toes up then toes out x 10  Stretching to hip with strap (hamstring x 2 )  Bridge x 10  Bridge with butterfly Sidelying clam green band x 15  Seated piriformis x 3 , 30 sec each   [facility] Adult PT Treatment:                                                DATE: 02/14/23 Therapeutic Exercise: NuStep L5 Ue and LE for 5 min  Sit to stand, cues for slow descent x 15  Squat hover 2 sec x 10 Lateral walking  3 sets x 10 steps  Standing hip abduction red band at ankle then hip x 15 x 2 each side  Supine red looped band: clam and then bridge with leg lift  SLR x 12-15  each LE x 2  Standing calf stretch on slantboard, double leg and single for post knee flexibility. Self Care: Importance of doing a challenging exercise to make progress   [facility] Adult PT Treatment:                                                DATE: 02/12/23 Therapeutic Exercise: Nustep  5 min, L6 UE and LE Supine Hamstring Stretch with Strap   3 x 20 sec  Supine ITB Stretch with Strap  3 x 20 sec  Seated Piriformis Stretch 2 x 1 min  Active Straight Leg Raise with Quad Set x 10 , 2 sets 1 set with hip ER  Sidelying Hip Abduction  x 10 x 2 sets  Supine Bridge x 15 Calf stretch 45 sec x 1 each side  Wall squats x 10 mid range  Isometric hold 20 sec  Active quad stretch x 30 sec   Therapeutic Activity: 6-min walk 1815 feet    [facility] Adult PT Treatment:                                                DATE: 02/08/23 Therapeutic Exercise: Recumbent bike 6 min L3 level  Seated piriformis 45 sec x 1 each side Hamstring 30 sec x 2 ITB 30 sec x 2  SLR hip flexion, extension and abduction  Bridging x 15  Singel leg x 10 each sit to stand x 15 Squat x 15 with maximal cues for hip hinge Self Care: Walking program, acupuncture vs dry needling, HEP cues for technique , squat form to reduce knee force  [facility] Adult PT Treatment:                                                DATE: 02/06/23 Therapeutic Exercise: Developed, instructed in, and pt completed therex as noted in HEP  Self Care: RICE for management  of symptoms; Instruction in step to pattern for steps to reduce pain                                                                                                                           PATIENT EDUCATION:  Education details: Eval findings, POC, HEP, self care  Person educated: Patient and Spouse Education method:  Explanation, Demonstration, Tactile cues, Verbal cues, and Handouts Education comprehension: verbalized understanding, returned demonstration, verbal cues required, tactile cues required, and needs further education  HOME EXERCISE PROGRAM: Access Code: [email or web address] URL: [email or web address] Date: 02/08/2023 Prepared by: Karie Mainland  Exercises - Supine Hamstring Stretch with Strap  -  2 x daily - 7 x weekly - 1 sets - 3 reps - 30 hold - Supine ITB Stretch with Strap  - 2 x daily - 7 x weekly - 1 sets - 3 reps - 30 hold - Seated Piriformis Stretch (Mirrored)  - 1 x daily - 7 x weekly - 1 sets - 3 reps - 30 hold - Gastroc Stretch on Wall (Mirrored)  - 1 x daily - 7 x weekly - 1 sets - 3 reps - 30 hold - Active Straight Leg Raise with Quad Set  - 1 x daily - 7 x weekly - 2 sets - 10 reps - 5 hold - Sidelying Hip Abduction  - 1 x daily - 7 x weekly - 2 sets - 10 reps - 5 hold - Supine Bridge  - 1 x daily - 7 x weekly - 2 sets - 10 reps - 5 hold   ASSESSMENT:  CLINICAL IMPRESSION: Patient continues to have knee pain, moderate much of the time.  She did the exercises today and did not have any increased pain.  She thinks she may be having increased pain at home because she is doing it wrong.  She is interested to see if dry needling to her hamstring or calf may give her some relief. Encouraged consistency with HEP for optimal results.   OBJECTIVE IMPAIRMENTS: decreased activity tolerance, difficulty walking, decreased ROM, impaired flexibility, and pain.   ACTIVITY LIMITATIONS: squatting, stairs, and locomotion level  PARTICIPATION LIMITATIONS: cleaning, shopping, and community activity  PERSONAL FACTORS: Time since onset of injury/illness/exacerbation are also affecting patient's functional outcome.   REHAB POTENTIAL: Excellent  CLINICAL DECISION MAKING: Stable/uncomplicated  EVALUATION COMPLEXITY: Low   GOALS:   SHORT TERM GOALS=LTGs   LONG TERM GOALS: Target date:  03/29/23  Pt will be Ind in a final HEP to maintain achieved LOF  Baseline: started Goal status: ongoing   2.  Pt will voice understanding of measures to assist in pain reduction  Baseline: Waiting it out.  No OTC meds. No ice or heat.  Goal status: ongoing   3.  Increased R DF to 10d for improved mechanics with gait Baseline: 5d Goal status: ongoing   4.  Pt will report a 50% or greater decrease in R knee pain for improved function and QOL Baseline: 0-8/10 Goal status: ongoing   5.  Pt's FOTO score will improved to the predicted value of 68% as indication of improved function  Baseline: 56% Goal status: ongoing   6.  Assess and will improve by MCID of 75ft as indication of improved functional mobility if a deficit is found Baseline: 6 min walk 1800 feet +, goal deferred  Goal status: MET, DC GOAL    PLAN:  PT FREQUENCY: 2x/week  PT DURATION: 6 weeks  PLANNED INTERVENTIONS: Therapeutic exercises, Therapeutic activity, Neuromuscular re-education, Balance training, Gait training, Patient/Family education, Self Care, Joint mobilization, Stair training, Aquatic Therapy, Dry Needling, Electrical stimulation, Cryotherapy, Moist heat, Taping, Vasopneumatic device, Ultrasound, Ionotophoresis 4mg /ml Dexamethasone, Manual therapy, and Re-evaluation  PLAN FOR NEXT SESSION: squat form , lifting mech. Review FOTO; assess response to HEP; progress therex as indicated; use of modalities, manual therapy; and TPDN as indicated.  Karie Mainland, PT 02/18/23 3:41 PM  Karie Mainland, PT 02/18/23 3:41 PM Phone: [phone] Fax: [phone]

## 2023-02-19 NOTE — Therapy (Signed)
OUTPATIENT PHYSICAL THERAPY TREATMENT   Patient Name: Gina Johns MRN: [id number] DOB:[birth year], 67 y.o., female Today's Date: 02/21/2023  END OF SESSION:  PT End of Session - 02/20/23 1655     Visit Number 6    Number of Visits 13    Date for PT Re-Evaluation 03/29/23    Authorization Type BCBS MEDICARE    PT Start Time 1633    PT Stop Time 1718    PT Time Calculation (min) 45 min    Activity Tolerance Patient tolerated treatment well    Behavior During Therapy [facility] for tasks assessed/performed                 Past Medical History:  Diagnosis Date   Anxiety    Arthritis    Breast cancer (HCC) 12/07/2011   right breast 2x3 cm   Depression    " sometimes"   Hypercholesterolemia    PMH   Right knee pain 08/26/2012   Past Surgical History:  Procedure Laterality Date   BREAST LUMPECTOMY WITH AXILLARY LYMPH NODE BIOPSY     CESAREAN SECTION     Hx: of times 2   MASS EXCISION Right 02/25/2014   Procedure: EXCISION MASS RIGHT CHEST WALL;  Surgeon: Chevis Pretty III, MD;  Location: [facility] OR;  Service: General;  Laterality: Right;   MASTECTOMY Right    TOTAL MASTECTOMY Right 03/20/2013   Procedure: TOTAL MASTECTOMY;  Surgeon: Robyne Askew, MD;  Location: [facility] OR;  Service: General;  Laterality: Right;   Patient Active Problem List   Diagnosis Date Noted   Memory loss 06/10/2018   Depression with anxiety 06/10/2018   Low serum vitamin D 06/10/2018   Osteopenia determined by x-ray 04/05/2016   Upper airway cough syndrome 10/13/2015   Osteopenia 02/22/2014   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   Chondromalacia of patella, 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  PERTINENT HISTORY: Arthritis  PAIN:  Are you having pain? Yes: NPRS scale: 6/10 Pain location: ant R knee, lat inf patella pole  Pain description: ache Aggravating factors: walking and steps Relieving factors: pain does not usually last that long   Pain range on eval=0-8/10  PRECAUTIONS: None  RED FLAGS: None   WEIGHT BEARING RESTRICTIONS: No  FALLS:  Has patient fallen in last 6 months? No  LIVING ENVIRONMENT: Lives with: lives with their family Lives in: House/apartment No issue c accessing or mobility within home. 13 steps in home  OCCUPATION: Part-time as Research scientist (life sciences)  PLOF: Independent  PATIENT GOALS: less pain  NEXT MD VISIT: 03/12/23 Dr. Margaretha Sheffield  OBJECTIVE:   DIAGNOSTIC FINDINGS: DG R knee 01/22/23 FINDINGS: Minimal medial tibiofemoral joint space narrowing. There is mild to moderate tricompartmental peripheral spurring. Small knee joint effusion. Fragmented osteophyte about the lateral patella. No fracture or erosive change. No focal soft tissue abnormalities.   IMPRESSION: Mild-to-moderate tricompartmental osteoarthritis with small joint effusion.  PATIENT SURVEYS:  FOTO: Perceived function   56%, predicted   68%   COGNITION: Overall cognitive status: Within functional limits for tasks assessed     SENSATION: WFL  EDEMA:  None palpated or observed  MUSCLE LENGTH: Hamstrings: Right min tight deg; Left min tight deg Thomas test: Right NT deg; Left NT deg  POSTURE: No Significant postural limitations  PALPATION: Medial joint space, lateral inf patella pole, vastus lateralis/ITB  LOWER EXTREMITY ROM:  Active ROM Right eval Left eval  Hip flexion    Hip  extension    Hip abduction    Hip adduction    Hip internal rotation    Hip external rotation    Knee flexion WNLs WNLs  Knee extension WNLs WNLs  Ankle dorsiflexion 5 10  Ankle plantarflexion    Ankle inversion    Ankle eversion     (Blank rows = not tested)  LOWER EXTREMITY MMT:  MMT Right eval Left eval  Hip flexion 5 5  Hip extension 5 5  Hip abduction    Hip adduction 5 5  Hip internal rotation    Hip external rotation 5 5  Knee flexion 5 5  Knee extension 5 5  Ankle dorsiflexion    Ankle plantarflexion    Ankle inversion    Ankle eversion     (Blank rows = not tested)  LOWER EXTREMITY SPECIAL TESTS:  Knee special tests: Anterior drawer test: negative, Posterior drawer test: negative, McMurray's test: negative, Patellafemoral apprehension test: negative, Patellafemoral grind test: negative, and Patella tap test (ballotable patella): negative Pain of the R lateral thigh was provoked with R quad set  FUNCTIONAL TESTS:  5 times sit to stand: 10.8 " s use of arm rests WNLS  GAIT: Distance walked: 200" Assistive device utilized: None Level of assistance: Complete Independence Comments: WNLs, pt was not experiencing pain   TODAY'S TREATMENT:   [facility] Adult PT Treatment:                                                DATE: 02/20/23 Therapeutic Exercise: NuStep L5 UE and LE for 5 min  SLR c quad set x 10 in DF, x10 in PF Piriformis strech x2 20" ITB stretch c strap x2 20" Hip abduction 2x10 LAQ 4 lbs 2x10  NuStep L5 UE and LE for 5 min  Manual Therapy: STM to the R vastus lateralis Skilled palpation to identify TrPs and taut muscle bands Trigger Point Dry Needling Treatment: Pre-treatment instruction: Patient instructed on dry needling rationale, procedures, and possible side effects including pain during treatment (achy,cramping feeling), bruising, drop of blood, lightheadedness, nausea, sweating. Patient Consent Given: Yes Education handout provided:  Yes Muscles treated: R vastus lateralis  Needle size and number: .30x13mm x 1 Electrical stimulation performed: No Parameters: N/A Treatment response/outcome: Twitch response elicited and Palpable decrease in muscle tension Post-treatment instructions: Patient instructed to expect possible mild to moderate muscle soreness later today and/or tomorrow. Patient instructed in methods to reduce muscle soreness and to continue prescribed HEP. If patient was dry needled over the lung field, patient was instructed on signs and symptoms of pneumothorax and, however unlikely, to see immediate medical attention should they occur. Patient was also educated on signs and symptoms of infection and to seek medical attention should they occur. Patient verbalized understanding of these instructions and education.    [facility]  Adult PT Treatment:                                                DATE: 02/18/23 Therapeutic Exercise: NuStep L5 UE and LE for 5 min  Calf stretch at Slant board 1 min  Hip abduction x 15  Squat x 10 mini at the bar slow mod cues  LAQ 4 lbs x 15 each LE x 2  SLR 2 x 10 toes up then toes out x 10  Stretching to hip with strap (hamstring x 2 )  Bridge x 10  Bridge with butterfly Sidelying clam green band x 15  Seated piriformis x 3 , 30 sec each   [facility] Adult PT Treatment:                                                DATE: 02/14/23 Therapeutic Exercise: NuStep L5 Ue and LE for 5 min  Sit to stand, cues for slow descent x 15  Squat hover 2 sec x 10 Lateral walking 3 sets x 10 steps  Standing hip abduction red band at ankle then hip x 15 x 2 each side  Supine red looped band: clam and then bridge with leg lift  SLR x 12-15 each LE x 2  Standing calf stretch on slantboard, double leg and single for post knee flexibility. Self Care: Importance of doing a challenging exercise to make progress                                                                                                                       PATIENT EDUCATION:  Education details: Eval findings, POC, HEP, self care  Person educated: Patient and Spouse Education method: Explanation, Demonstration, Tactile cues, Verbal cues, and Handouts Education comprehension: verbalized understanding, returned demonstration, verbal cues required, tactile cues required, and needs further education  HOME EXERCISE PROGRAM: Access Code: [email or web address] URL: [email or web address] Date: 02/08/2023 Prepared by: Karie Mainland  Exercises - Supine Hamstring Stretch with Strap  - 2 x daily - 7 x weekly - 1 sets - 3 reps - 30 hold - Supine ITB Stretch with Strap  - 2 x daily - 7 x weekly - 1 sets - 3 reps - 30 hold - Seated Piriformis Stretch (Mirrored)  - 1 x daily - 7 x weekly - 1 sets - 3 reps - 30 hold - Gastroc Stretch on Wall (Mirrored)  - 1 x daily - 7 x weekly - 1 sets - 3 reps - 30 hold - Active Straight Leg Raise with Quad Set  - 1 x daily - 7 x weekly - 2 sets - 10 reps - 5 hold - Sidelying Hip Abduction  - 1 x daily -  7 x weekly - 2 sets - 10 reps - 5 hold - Supine Bridge  - 1 x daily - 7 x weekly - 2 sets - 10 reps - 5 hold   ASSESSMENT:  CLINICAL IMPRESSION: PT was completed for STM f/b TPDN to the vastus lateralis. With TPDN a muscle twitch response was elicited and decreased muscle tension was palpated. Therex was then completed for strengthening and stretching of the R knee/LE for muscle activation. Pt experienced the anticipated increase in muscle soreness following the TPDN, but otherwise tolerated treatment without adverse effects. Will assess pt's full response to today's session her next PT appt.  OBJECTIVE IMPAIRMENTS: decreased activity tolerance, difficulty walking, decreased ROM, impaired flexibility, and pain.   ACTIVITY LIMITATIONS: squatting, stairs, and locomotion level  PARTICIPATION LIMITATIONS: cleaning, shopping, and community activity  PERSONAL FACTORS: Time since onset of  injury/illness/exacerbation are also affecting patient's functional outcome.   REHAB POTENTIAL: Excellent  CLINICAL DECISION MAKING: Stable/uncomplicated  EVALUATION COMPLEXITY: Low   GOALS:   SHORT TERM GOALS=LTGs   LONG TERM GOALS: Target date: 03/29/23  Pt will be Ind in a final HEP to maintain achieved LOF  Baseline: started Goal status: ongoing   2.  Pt will voice understanding of measures to assist in pain reduction  Baseline: Waiting it out.  No OTC meds. No ice or heat.  Goal status: ongoing   3.  Increased R DF to 10d for improved mechanics with gait Baseline: 5d Goal status: ongoing   4.  Pt will report a 50% or greater decrease in R knee pain for improved function and QOL Baseline: 0-8/10 Goal status: ongoing   5.  Pt's FOTO score will improved to the predicted value of 68% as indication of improved function  Baseline: 56% Goal status: ongoing   6.  Assess and will improve by MCID of 53ft as indication of improved functional mobility if a deficit is found Baseline: 6 min walk 1800 feet +, goal deferred  Goal status: MET, DC GOAL    PLAN:  PT FREQUENCY: 2x/week  PT DURATION: 6 weeks  PLANNED INTERVENTIONS: Therapeutic exercises, Therapeutic activity, Neuromuscular re-education, Balance training, Gait training, Patient/Family education, Self Care, Joint mobilization, Stair training, Aquatic Therapy, Dry Needling, Electrical stimulation, Cryotherapy, Moist heat, Taping, Vasopneumatic device, Ultrasound, Ionotophoresis 4mg /ml Dexamethasone, Manual therapy, and Re-evaluation  PLAN FOR NEXT SESSION: squat form , lifting mech. Review FOTO; assess response to HEP; progress therex as indicated; use of modalities, manual therapy; and TPDN as indicated.  Tabbitha Janvrin MS, PT 02/21/23 5:31 AM

## 2023-02-20 ENCOUNTER — Ambulatory Visit: Payer: Medicare Other

## 2023-02-20 DIAGNOSIS — R262 Difficulty in walking, not elsewhere classified: Secondary | ICD-10-CM

## 2023-02-20 DIAGNOSIS — M6281 Muscle weakness (generalized): Secondary | ICD-10-CM | POA: Diagnosis not present

## 2023-02-20 DIAGNOSIS — M25561 Pain in right knee: Secondary | ICD-10-CM | POA: Diagnosis not present

## 2023-02-20 NOTE — Patient Instructions (Signed)

## 2023-02-25 NOTE — Therapy (Signed)
OUTPATIENT PHYSICAL THERAPY TREATMENT   Patient Name: Gina Johns MRN: [id number] DOB:1956-02-17, 67 y.o., female Today's Date: 02/26/2023  END OF SESSION:  PT End of Session - 02/26/23 1635     Visit Number 7    Number of Visits 13    Date for PT Re-Evaluation 03/29/23    Authorization Type [employer] MEDICARE    PT Start Time 1634    PT Stop Time 1720    PT Time Calculation (min) 46 min    Activity Tolerance Patient tolerated treatment well    Behavior During Therapy WFL for tasks assessed/performed                  Past Medical History:  Diagnosis Date   Anxiety    Arthritis    Breast cancer (HCC) 12/07/2011   right breast 2x3 cm   Depression    " sometimes"   Hypercholesterolemia    PMH   Right knee pain 08/26/2012   Past Surgical History:  Procedure Laterality Date   BREAST LUMPECTOMY WITH AXILLARY LYMPH NODE BIOPSY     CESAREAN SECTION     Hx: of times 2   MASS EXCISION Right 02/25/2014   Procedure: EXCISION MASS RIGHT CHEST WALL;  Surgeon: Chevis Pretty III, MD;  Location: [facility] OR;  Service: General;  Laterality: Right;   MASTECTOMY Right    TOTAL MASTECTOMY Right 03/20/2013   Procedure: TOTAL MASTECTOMY;  Surgeon: Robyne Askew, MD;  Location: [facility] OR;  Service: General;  Laterality: Right;   Patient Active Problem List   Diagnosis Date Noted   Memory loss 06/10/2018   Depression with anxiety 06/10/2018   Low serum vitamin D 06/10/2018   Osteopenia determined by x-ray 04/05/2016   Upper airway cough syndrome 10/13/2015   Osteopenia 02/22/2014   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   Chondromalacia of patella, 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   Lymphedema 10/19/2013   Breast cancer of upper-outer quadrant of right female breast (HCC) 03/06/2013   Dyslipidemia 01/22/2013   Right knee pain 08/26/2012    PCP: Chilton Greathouse, MD  REFERRING PROVIDER: Ralene Cork, DO  REFERRING DIAG: [phone] (ICD-10-CM) - Acute pain of right knee   THERAPY DIAG:  Acute pain of right  knee  Muscle weakness (generalized)  Difficulty in walking, not elsewhere classified  Rationale for Evaluation and Treatment: Rehabilitation  ONSET DATE: approx 1 month ago  SUBJECTIVE:   SUBJECTIVE STATEMENT: Overall, pt reports her R knee pain has improved a little with decreased frequency of the incidences of pain. The pain is brief and intermittent.  PERTINENT HISTORY: Arthritis  PAIN:  Are you having pain? Yes: NPRS scale: 6/10 Pain location: ant R knee, lat inf patella pole  Pain description: ache Aggravating factors: walking and steps Relieving factors: pain does not usually last that long   Pain range on eval=0-8/10  PRECAUTIONS: None  RED FLAGS: None   WEIGHT BEARING RESTRICTIONS: No  FALLS:  Has patient fallen in last 6 months? No  LIVING ENVIRONMENT: Lives with: lives with their family Lives in: House/apartment No issue c accessing or mobility within home. 13 steps in home  OCCUPATION: Part-time as Research scientist (life sciences)  PLOF: Independent  PATIENT GOALS: less pain  NEXT MD VISIT: 03/12/23 Dr. Margaretha Sheffield  OBJECTIVE:   DIAGNOSTIC FINDINGS: DG R knee 01/22/23 FINDINGS: Minimal medial tibiofemoral joint space narrowing. There is mild to moderate tricompartmental peripheral spurring. Small knee joint effusion. Fragmented osteophyte about the lateral patella. No fracture or erosive change. No focal soft  tissue abnormalities.   IMPRESSION: Mild-to-moderate tricompartmental osteoarthritis with small joint effusion.  PATIENT SURVEYS:  FOTO: Perceived function   56%, predicted   68%   COGNITION: Overall cognitive status: Within functional limits for tasks assessed     SENSATION: WFL  EDEMA:  None palpated or observed  MUSCLE LENGTH: Hamstrings: Right min tight deg; Left min tight deg Thomas test: Right NT deg; Left NT deg  POSTURE: No Significant postural limitations  PALPATION: Medial joint space, lateral inf patella pole, vastus  lateralis/ITB  LOWER EXTREMITY ROM:  Active ROM Right eval Left eval Rt 02/26/23  Hip flexion     Hip extension     Hip abduction     Hip adduction     Hip internal rotation     Hip external rotation     Knee flexion WNLs WNLs   Knee extension WNLs WNLs   Ankle dorsiflexion 5 10 10   Ankle plantarflexion     Ankle inversion     Ankle eversion      (Blank rows = not tested)  LOWER EXTREMITY MMT:  MMT Right eval Left eval  Hip flexion 5 5  Hip extension 5 5  Hip abduction    Hip adduction 5 5  Hip internal rotation    Hip external rotation 5 5  Knee flexion 5 5  Knee extension 5 5  Ankle dorsiflexion    Ankle plantarflexion    Ankle inversion    Ankle eversion     (Blank rows = not tested)  LOWER EXTREMITY SPECIAL TESTS:  Knee special tests: Anterior drawer test: negative, Posterior drawer test: negative, McMurray's test: negative, Patellafemoral apprehension test: negative, Patellafemoral grind test: negative, and Patella tap test (ballotable patella): negative Pain of the R lateral thigh was provoked with R quad set  FUNCTIONAL TESTS:  5 times sit to stand: 10.8 " s use of arm rests WNLS  GAIT: Distance walked: 200" Assistive device utilized: None Level of assistance: Complete Independence Comments: WNLs, pt was not experiencing pain   TODAY'S TREATMENT:   [facility] Adult PT Treatment:                                                DATE: 02/26/23 Therapeutic Exercise: NuStep L5 UE and LE for 5 min  SLR c quad set x 10 in DF, x10 in PF Piriformis strech x2 20" ITB stretch c strap x2 20" Hamstring stretch c strap x2 20" LAQ 4 lbs 2x10 c hip add ball squeezes Lateral side steps c airex 2x10 Wall slides to 60d c hip add ball Manual Therapy: STM to the R vastus lateralis Skilled palpation to identify TrPs and taut muscle bands Trigger Point Dry Needling Treatment: Pre-treatment instruction: Patient instructed on dry needling rationale, procedures, and possible  side effects including pain during treatment (achy,cramping feeling), bruising, drop of blood, lightheadedness, nausea, sweating. Patient Consent Given: Yes Education handout provided: Yes Muscles treated: R vastus lateralis  Needle size and number: .25x52mm x 2 Electrical stimulation performed: No Parameters: N/A Treatment response/outcome: Twitch response elicited and Palpable decrease in muscle tension Post-treatment instructions: Patient instructed to expect possible mild to moderate muscle soreness later today and/or tomorrow. Patient instructed in methods to reduce muscle soreness and to continue prescribed HEP. If patient was dry needled over the lung field, patient was instructed on signs and symptoms of  pneumothorax and, however unlikely, to see immediate medical attention should they occur. Patient was also educated on signs and symptoms of infection and to seek medical attention should they occur. Patient verbalized understanding of these instructions and education.   [facility] Adult PT Treatment:                                                DATE: 02/20/23 Therapeutic Exercise: NuStep L5 UE and LE for 5 min  SLR c quad set x 10 in DF, x10 in PF Piriformis strech x1 20" ITB stretch c strap x2 20" Hip abduction 2x10 LAQ 4 lbs 2x10  NuStep L5 UE and LE for 5 min  Manual Therapy: STM to the R vastus lateralis Skilled palpation to identify TrPs and taut muscle bands Trigger Point Dry Needling Treatment: Pre-treatment instruction: Patient instructed on dry needling rationale, procedures, and possible side effects including pain during treatment (achy,cramping feeling), bruising, drop of blood, lightheadedness, nausea, sweating. Patient Consent Given: Yes Education handout provided: Yes Muscles treated: R vastus lateralis  Needle size and number: .30x30mm x 1 Electrical stimulation performed: No Parameters: N/A Treatment response/outcome: Twitch response elicited and Palpable decrease  in muscle tension Post-treatment instructions: Patient instructed to expect possible mild to moderate muscle soreness later today and/or tomorrow. Patient instructed in methods to reduce muscle soreness and to continue prescribed HEP. If patient was dry needled over the lung field, patient was instructed on signs and symptoms of pneumothorax and, however unlikely, to see immediate medical attention should they occur. Patient was also educated on signs and symptoms of infection and to seek medical attention should they occur. Patient verbalized understanding of these instructions and education.    [facility] Adult PT Treatment:                                                DATE: 02/18/23 Therapeutic Exercise: NuStep L5 UE and LE for 5 min  Calf stretch at Slant board 1 min  Hip abduction x 15  Squat x 10 mini at the bar slow mod cues  LAQ 4 lbs x 15 each LE x 2  SLR 2 x 10 toes up then toes out x 10  Stretching to hip with strap (hamstring x 2 )  Bridge x 10  Bridge with butterfly Sidelying clam green band x 15  Seated piriformis x 3 , 30 sec each   [facility] Adult PT Treatment:                                                DATE: 02/14/23 Therapeutic Exercise: NuStep L5 Ue and LE for 5 min  Sit to stand, cues for slow descent x 15  Squat hover 2 sec x 10 Lateral walking 3 sets x 10 steps  Standing hip abduction red band at ankle then hip x 15 x 2 each side  Supine red looped band: clam and then bridge with leg lift  SLR x 12-15 each LE x 2  Standing calf stretch on slantboard, double leg and single for post knee flexibility. Self Care: Importance  of doing a challenging exercise to make progress                                                                                                                      PATIENT EDUCATION:  Education details: Eval findings, POC, HEP, self care  Person educated: Patient and Spouse Education method: Explanation, Demonstration, Tactile cues, Verbal cues, and  Handouts Education comprehension: verbalized understanding, returned demonstration, verbal cues required, tactile cues required, and needs further education  HOME EXERCISE PROGRAM: Access Code: [email or web address] URL: [email or web address] Date: 02/08/2023 Prepared by: Karie Mainland  Exercises - Supine Hamstring Stretch with Strap  - 2 x daily - 7 x weekly - 1 sets - 3 reps - 30 hold - Supine ITB Stretch with Strap  - 2 x daily - 7 x weekly - 1 sets - 3 reps - 30 hold - Seated Piriformis Stretch (Mirrored)  - 1 x daily - 7 x weekly - 1 sets - 3 reps - 30 hold - Gastroc Stretch on Wall (Mirrored)  - 1 x daily - 7 x weekly - 1 sets - 3 reps - 30 hold - Active Straight Leg Raise with Quad Set  - 1 x daily - 7 x weekly - 2 sets - 10 reps - 5 hold - Sidelying Hip Abduction  - 1 x daily - 7 x weekly - 2 sets - 10 reps - 5 hold - Supine Bridge  - 1 x daily - 7 x weekly - 2 sets - 10 reps - 5 hold   ASSESSMENT:  CLINICAL IMPRESSION: PT was continued for STM f/b TPDN to the vastus lateralis. Muscle twitch responses were elicited with TPDN. Therex was then completed for strengthening and stretching of the R knee/LE for muscle activation. Reassessed R nakle DF ROM was was found increased, meeting goal #3. Pt tolerated treatment without adverse effects. Pt will continue to benefit from skilled PT to address impairments for improved function with less pain.    OBJECTIVE IMPAIRMENTS: decreased activity tolerance, difficulty walking, decreased ROM, impaired flexibility, and pain.   ACTIVITY LIMITATIONS: squatting, stairs, and locomotion level  PARTICIPATION LIMITATIONS: cleaning, shopping, and community activity  PERSONAL FACTORS: Time since onset of injury/illness/exacerbation are also affecting patient's functional outcome.   REHAB POTENTIAL: Excellent  CLINICAL DECISION MAKING: Stable/uncomplicated  EVALUATION COMPLEXITY: Low   GOALS:   SHORT TERM GOALS=LTGs   LONG TERM GOALS:  Target date: 03/29/23  Pt will be Ind in a final HEP to maintain achieved LOF  Baseline: started Goal status: ongoing   2.  Pt will voice understanding of measures to assist in pain reduction  Baseline: Waiting it out.  No OTC meds. No ice or heat.  Goal status: ongoing   3.  Increased R DF to 10d for improved mechanics with gait Baseline: 5d 02/26/23: 12d Goal status: MET   4.  Pt will report a 50% or greater decrease in R knee pain for improved function and QOL Baseline: 0-8/10 Goal status: ongoing  5.  Pt's FOTO score will improved to the predicted value of 68% as indication of improved function  Baseline: 56% Goal status: ongoing   6.  Assess and will improve by MCID of 62ft as indication of improved functional mobility if a deficit is found Baseline: 6 min walk 1800 feet +, goal deferred  Goal status: MET, DC GOAL    PLAN:  PT FREQUENCY: 2x/week  PT DURATION: 6 weeks  PLANNED INTERVENTIONS: Therapeutic exercises, Therapeutic activity, Neuromuscular re-education, Balance training, Gait training, Patient/Family education, Self Care, Joint mobilization, Stair training, Aquatic Therapy, Dry Needling, Electrical stimulation, Cryotherapy, Moist heat, Taping, Vasopneumatic device, Ultrasound, Ionotophoresis 4mg /ml Dexamethasone, Manual therapy, and Re-evaluation  PLAN FOR NEXT SESSION: squat form , lifting mech. Review FOTO; assess response to HEP; progress therex as indicated; use of modalities, manual therapy; and TPDN as indicated.  Tarek Cravens MS, PT 02/26/23 5:38 PM

## 2023-02-26 ENCOUNTER — Ambulatory Visit: Payer: Medicare Other | Attending: Internal Medicine

## 2023-02-26 DIAGNOSIS — M6281 Muscle weakness (generalized): Secondary | ICD-10-CM | POA: Insufficient documentation

## 2023-02-26 DIAGNOSIS — M25561 Pain in right knee: Secondary | ICD-10-CM | POA: Insufficient documentation

## 2023-02-26 DIAGNOSIS — R262 Difficulty in walking, not elsewhere classified: Secondary | ICD-10-CM | POA: Insufficient documentation

## 2023-03-03 NOTE — Progress Notes (Unsigned)
Primary Physician/Referring:  Chilton Greathouse, MD  Patient ID: Gina Johns, female    DOB: 1956-06-22, 67 y.o.   MRN: [id number]  No chief complaint on file.  HPI:    Gina Johns  is a 67 y.o. 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  ***  Past Medical History:  Diagnosis Date   Anxiety    Arthritis    Breast cancer (HCC) 12/07/2011   right breast 2x3 cm   Depression    " sometimes"   Hypercholesterolemia    PMH   Right knee pain 08/26/2012   Past Surgical History:  Procedure Laterality Date   BREAST LUMPECTOMY WITH AXILLARY LYMPH NODE BIOPSY     CESAREAN SECTION     Hx: of times 2   MASS EXCISION Right 02/25/2014   Procedure: EXCISION MASS RIGHT CHEST WALL;  Surgeon: Chevis Pretty III, MD;  Location: [facility] OR;  Service: General;  Laterality: Right;   MASTECTOMY Right    TOTAL MASTECTOMY Right 03/20/2013   Procedure: TOTAL MASTECTOMY;  Surgeon: Robyne Askew, MD;  Location: [facility] OR;  Service: General;  Laterality: Right;   Family History  Problem Relation Age of Onset   Cancer Sister 36       acute leukemia   Hypertension Sister    Hypertension Mother     Social History   Tobacco Use   Smoking status: Never   Smokeless tobacco: Never  Substance Use Topics   Alcohol use: No   Marital Status: Married  ROS  ***ROS Objective      09/12/2021    3:36 PM 06/10/2018   11:01 AM 04/05/2016    4:42 PM  Vitals with BMI  Height 5\' 4"  5\' 3"  5\' 3"   Weight 156 lbs 162 lbs 8 oz 159 lbs 10 oz  BMI 26.76 28.79 28.3  Systolic 114 119 409  Diastolic 72 76 81  Pulse  63 69   Last menstrual period 03/04/2013.   ***Physical Exam Laboratory examination:   External labs:   Labs 10/16/2022:  Serum glucose 98 mg, BUN 12, creatinine  0.9, EGFR 62.6/75.8 mL, LFTs normal.  Hb 13.4/HCT 39.2, platelets 249, normal indicis.  Total cholesterol 264, triglycerides 197, HDL 44, LDL 181.  Non-HDL cholesterol 220.  Apolipoprotein B 147.  Vitamin D 27.4.  Uric acid 7.4.  TSH 3.140.  Radiology:   Chest x-ray two-view 11/26/2019: Normal heart size and pulmonary vascularity.  Prominent epicardial fat pad at LEFT cardiophrenic angle unchanged.  Atherosclerotic calcification aorta.  Lungs clear. No pleural effusion or pneumothorax.  Post RIGHT mastectomy.   Cardiac Studies:   ***  EKG:   ***  *** Medications and allergies  No Known Allergies  Medication list   Current Outpatient Medications:    cholecalciferol (VITAMIN D) 1000 units tablet, Take 1 tablet (1,000 Units total) by mouth daily. (Patient taking differently: Take 1,000 Units by mouth every 30 (thirty) days. ), Disp: 100 tablet, Rfl: 4   Cyanocobalamin (B-12 PO), Take 1 Dose by mouth., Disp: , Rfl:    sertraline (ZOLOFT) 50 MG tablet, Take 1/2 to 1 pill daily, Disp: 90 tablet, Rfl: 1  Assessment     ICD-10-CM   1. Dyspnea on exertion  R06.09     2. Pure hypercholesterolemia  E78.00  No orders of the defined types were placed in this encounter.   No orders of the defined types were placed in this encounter.   There are no discontinued medications.   Recommendations:   Gina Johns is a 67 y.o. 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  ***    Yates Decamp, MD, [facility] 03/03/2023, 10:28 PM Office: [phone]

## 2023-03-04 ENCOUNTER — Ambulatory Visit: Payer: Medicare Other | Admitting: Cardiology

## 2023-03-04 ENCOUNTER — Encounter: Payer: Self-pay | Admitting: Cardiology

## 2023-03-04 VITALS — BP 120/70 | HR 70 | Resp 16 | Ht 64.0 in | Wt 161.0 lb

## 2023-03-04 DIAGNOSIS — I700 Atherosclerosis of aorta: Secondary | ICD-10-CM

## 2023-03-04 DIAGNOSIS — E7800 Pure hypercholesterolemia, unspecified: Secondary | ICD-10-CM | POA: Diagnosis not present

## 2023-03-04 DIAGNOSIS — R0609 Other forms of dyspnea: Secondary | ICD-10-CM | POA: Diagnosis not present

## 2023-03-04 DIAGNOSIS — J4530 Mild persistent asthma, uncomplicated: Secondary | ICD-10-CM

## 2023-03-04 MED ORDER — ASPIRIN 81 MG PO CHEW
81.0000 mg | CHEWABLE_TABLET | Freq: Every day | ORAL | Status: DC
Start: 2023-03-04 — End: 2023-03-07

## 2023-03-04 MED ORDER — EZETIMIBE-SIMVASTATIN 10-40 MG PO TABS
1.0000 | ORAL_TABLET | Freq: Every evening | ORAL | 2 refills | Status: DC
Start: 2023-03-04 — End: 2023-04-10

## 2023-03-04 NOTE — Progress Notes (Unsigned)
Primary Physician/Referring:  Chilton Greathouse, MD  Patient ID: Gina Johns, female    DOB: Mar 27, 1956, 67 y.o.   MRN: [id number]  Chief Complaint  Patient presents with   Dyspnea on exertion   New Patient (Initial Visit)   HPI:    Gina Johns  is a 67 y.o. 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  Patient states that especially over the last year or so symptoms of dyspnea have gotten very worse, even doing minimal activity brings on marked dyspnea.  She denies any associated chest pain and states that her activity is limited.  She also continues to have episodes of wheezing at night.  She is accompanied by her husband.  Past Medical History:  Diagnosis Date   Anxiety    Arthritis    Breast cancer (HCC) 12/07/2011   right breast 2x3 cm   Depression    " sometimes"   Hypercholesterolemia    PMH   Right knee pain 08/26/2012   Past Surgical History:  Procedure Laterality Date   BREAST LUMPECTOMY WITH AXILLARY LYMPH NODE BIOPSY     CESAREAN SECTION     Hx: of times 2   MASS EXCISION Right 02/25/2014   Procedure: EXCISION MASS RIGHT CHEST WALL;  Surgeon: Chevis Pretty III, MD;  Location: [facility] OR;  Service: General;  Laterality: Right;   MASTECTOMY Right    TOTAL MASTECTOMY Right 03/20/2013   Procedure: TOTAL MASTECTOMY;  Surgeon: Robyne Askew, MD;  Location: [facility] OR;  Service: General;  Laterality: Right;   Family History  Problem Relation Age of Onset   Hypertension Mother    Cancer Sister 90       acute leukemia   Hypertension Sister     Social History   Tobacco Use   Smoking status: Never   Smokeless tobacco: Never  Substance Use Topics   Alcohol use: No   Marital Status: Married  ROS  Review of Systems  Cardiovascular:   Positive for dyspnea on exertion. Negative for chest pain and leg swelling.  Respiratory:  Positive for cough and wheezing.    Objective      03/04/2023    4:35 PM 09/12/2021    3:36 PM 06/10/2018   11:01 AM  Vitals with BMI  Height 5\' 4"  5\' 4"  5\' 3"   Weight 161 lbs 156 lbs 162 lbs 8 oz  BMI 27.62 26.76 28.79  Systolic 120 114 401  Diastolic 70 72 76  Pulse 70  63   Blood pressure 120/70, pulse 70, resp. rate 16, height 5\' 4"  (1.626 m), weight 161 lb (73 kg), last menstrual period 03/04/2013, SpO2 97%.   Physical Exam Neck:     Vascular: No carotid bruit or JVD.  Cardiovascular:     Rate and Rhythm: Normal rate and regular rhythm.     Pulses: Intact distal pulses.     Heart sounds: Normal heart sounds. No murmur heard.    No gallop.  Pulmonary:     Effort: Pulmonary effort is normal.     Breath sounds: Wheezing (scattered and diffuse) present.  Abdominal:     General: Bowel sounds are normal.     Palpations: Abdomen is soft.  Musculoskeletal:     Right lower leg: No edema.     Left lower  leg: No edema.    Laboratory examination:   External labs:   Labs 10/16/2022:  Serum glucose 98 mg, BUN 12, creatinine 0.9, EGFR 62.6/75.8 mL, LFTs normal.  Hb 13.4/HCT 39.2, platelets 249, normal indicis.  Total cholesterol 264, triglycerides 197, HDL 44, LDL 181.  Non-HDL cholesterol 220.  Apolipoprotein B 147.  Vitamin D 27.4.  Uric acid 7.4.  TSH 3.140.  Radiology:   Chest x-ray two-view 11/26/2019: Normal heart size and pulmonary vascularity.  Prominent epicardial fat pad at LEFT cardiophrenic angle unchanged.  Atherosclerotic calcification aorta.  Lungs clear. No pleural effusion or pneumothorax.  Post RIGHT mastectomy.   Cardiac Studies:   NA  EKG:   EKG 03/04/2023: Normal sinus rhythm with rate of 63 bpm, left atrial enlargement.  No evidence of ischemia.    Medications and allergies  No Known Allergies  Medication list   Current Outpatient Medications:     aspirin (ASPIRIN CHILDRENS) 81 MG chewable tablet, Chew 1 tablet (81 mg total) by mouth daily., Disp: , Rfl:    budesonide-formoterol (SYMBICORT) 160-4.5 MCG/ACT inhaler, Inhale 2 puffs into the lungs as needed., Disp: , Rfl:    cholecalciferol (VITAMIN D) 1000 units tablet, Take 1 tablet (1,000 Units total) by mouth daily. (Patient taking differently: Take 1,000 Units by mouth every 30 (thirty) days.), Disp: 100 tablet, Rfl: 4   ezetimibe-simvastatin (VYTORIN) 10-40 MG tablet, Take 1 tablet by mouth every evening., Disp: 30 tablet, Rfl: 2  Assessment     ICD-10-CM   1. Dyspnea on exertion  R06.09 EKG 12-Lead    PCV ECHOCARDIOGRAM COMPLETE    PCV MYOCARDIAL PERFUSION WO LEXISCAN    2. Pure hypercholesterolemia  E78.00 ezetimibe-simvastatin (VYTORIN) 10-40 MG tablet    Lipid Panel With LDL/HDL Ratio    Lipoprotein A (LPA)    3. Aortic atherosclerosis (HCC)  I70.0 PCV MYOCARDIAL PERFUSION WO LEXISCAN    aspirin (ASPIRIN CHILDRENS) 81 MG chewable tablet    Lipid Panel With LDL/HDL Ratio    Lipoprotein A (LPA)    4. Mild persistent reactive airway disease without complication  J45.30         Orders Placed This Encounter  Procedures   Lipid Panel With LDL/HDL Ratio   Lipoprotein A (LPA)   PCV MYOCARDIAL PERFUSION WO LEXISCAN    Standing Status:   Future    Standing Expiration Date:   05/04/2023   EKG 12-Lead   PCV ECHOCARDIOGRAM COMPLETE    Standing Status:   Future    Standing Expiration Date:   03/03/2024    Meds ordered this encounter  Medications   ezetimibe-simvastatin (VYTORIN) 10-40 MG tablet    Sig: Take 1 tablet by mouth every evening.    Dispense:  30 tablet    Refill:  2   aspirin (ASPIRIN CHILDRENS) 81 MG chewable tablet    Sig: Chew 1 tablet (81 mg total) by mouth daily.    Medications Discontinued During This Encounter  Medication Reason   Cyanocobalamin (B-12 PO)    sertraline (ZOLOFT) 50 MG tablet      Recommendations:   Gina Johns is a 67 y.o. Asian  [place] female patient with history of breast cancer lymph node negative SP right mastectomy + radiation therapy to the chest in 2016, mild right arm lymphedema, hypercholesterolemia, reactive airway disease referred to me for evaluation of worsening dyspnea on exertion, fatigue.  1. Dyspnea on exertion Patient has significant dyspnea, she also has reactive airway disease that makes her evaluation of symptoms extremely  difficult.  She is not sure that she could walk on the treadmill but I urged her that we will perform a exercise nuclear stress test especially in view of uncontrolled hypercholesterolemia and aortic atherosclerosis noted more than 4 to 5 years ago.  She has also had radiation therapy to her chest.  This also predisposes her for high risk for development of major vessel coronary artery disease.  I also suspect she has significant reactive airway disease as I could hear bilateral wheezing.  She is only using inhalers on a as needed basis but I suspect she should probably be on medications on a daily basis for prevention as well.  - EKG 12-Lead - PCV ECHOCARDIOGRAM COMPLETE; Future - PCV MYOCARDIAL PERFUSION WO LEXISCAN; Future  2. Pure hypercholesterolemia Patient has untreated hypercholesterolemia and has been reluctant in starting medications.  I discussed with her that she already has established vascular disease as evidenced by aortic atherosclerosis by chest x-ray in 2021.  She is now willing to start medications, we will start her on Vytorin 10/40 mg in the evening.  Will repeat lipids and Lp(a) in 6 weeks or so.  - ezetimibe-simvastatin (VYTORIN) 10-40 MG tablet; Take 1 tablet by mouth every evening.  Dispense: 30 tablet; Refill: 2 - Lipid Panel With LDL/HDL Ratio - Lipoprotein A (LPA)  3. Aortic atherosclerosis (HCC) As dictated above chest x-ray reveals aortic atherosclerosis.  With untreated hyperlipidemia due to patient's preference, age, Asian heritage, prior history of  radiation therapy to her chest for breast cancer all predisposes her for development of significant proximal coronary artery disease.  - PCV MYOCARDIAL PERFUSION WO LEXISCAN; Future - aspirin (ASPIRIN CHILDRENS) 81 MG chewable tablet; Chew 1 tablet (81 mg total) by mouth daily. - Lipid Panel With LDL/HDL Ratio - Lipoprotein A (LPA)  4. Mild persistent reactive airway disease without complication Patient has persistent reactive airway disease, will need maintenance inhaler therapy.  She is presently using only as needed Symbicort.  Will forward my note to her PCP, patient is now willing to start taking medications on a regular basis.  She was very hesitant to starting medications on a daily basis previously.  Her husband is present and all questions answered.  Other orders - budesonide-formoterol (SYMBICORT) 160-4.5 MCG/ACT inhaler; Inhale 2 puffs into the lungs as needed.     Yates Decamp, MD, [place] 03/05/2023, 11:21 AM Office: [phone]

## 2023-03-05 ENCOUNTER — Other Ambulatory Visit: Payer: Self-pay | Admitting: Cardiology

## 2023-03-05 ENCOUNTER — Ambulatory Visit: Payer: Medicare Other

## 2023-03-05 DIAGNOSIS — M25561 Pain in right knee: Secondary | ICD-10-CM

## 2023-03-05 DIAGNOSIS — I700 Atherosclerosis of aorta: Secondary | ICD-10-CM

## 2023-03-05 DIAGNOSIS — M6281 Muscle weakness (generalized): Secondary | ICD-10-CM | POA: Diagnosis not present

## 2023-03-05 DIAGNOSIS — R0609 Other forms of dyspnea: Secondary | ICD-10-CM

## 2023-03-05 DIAGNOSIS — R262 Difficulty in walking, not elsewhere classified: Secondary | ICD-10-CM | POA: Diagnosis not present

## 2023-03-05 DIAGNOSIS — E7800 Pure hypercholesterolemia, unspecified: Secondary | ICD-10-CM

## 2023-03-05 NOTE — Therapy (Signed)
OUTPATIENT PHYSICAL THERAPY TREATMENT   Patient Name: Gina Johns MRN: [id number] DOB:1955/09/01, 67 y.o., female Today's Date: 03/05/2023  END OF SESSION:  PT End of Session - 03/05/23 1636     Visit Number 8    Number of Visits 13    Date for PT Re-Evaluation 03/29/23    Authorization Type BCBS MEDICARE    PT Start Time 1636    PT Stop Time 1720    PT Time Calculation (min) 44 min    Activity Tolerance Patient tolerated treatment well    Behavior During Therapy WFL for tasks assessed/performed                   Past Medical History:  Diagnosis Date   Anxiety    Arthritis    Breast cancer (HCC) 12/07/2011   right breast 2x3 cm   Depression    " sometimes"   Hypercholesterolemia    PMH   Right knee pain 08/26/2012   Past Surgical History:  Procedure Laterality Date   BREAST LUMPECTOMY WITH AXILLARY LYMPH NODE BIOPSY     CESAREAN SECTION     Hx: of times 2   MASS EXCISION Right 02/25/2014   Procedure: EXCISION MASS RIGHT CHEST WALL;  Surgeon: Chevis Pretty III, MD;  Location: [facility] OR;  Service: General;  Laterality: Right;   MASTECTOMY Right    TOTAL MASTECTOMY Right 03/20/2013   Procedure: TOTAL MASTECTOMY;  Surgeon: Robyne Askew, MD;  Location: [facility] OR;  Service: General;  Laterality: Right;   Patient Active Problem List   Diagnosis Date Noted   Memory loss 06/10/2018   Depression with anxiety 06/10/2018   Low serum vitamin D 06/10/2018   Osteopenia determined by x-ray 04/05/2016   Upper airway cough syndrome 10/13/2015   Osteopenia 02/22/2014   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   Chondromalacia of patella, 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   Lymphedema 10/19/2013   Breast cancer of upper-outer quadrant of right female breast (HCC) 03/06/2013   Dyslipidemia 01/22/2013   Right knee pain 08/26/2012    PCP: Chilton Greathouse, MD  REFERRING PROVIDER: Ralene Cork, DO  REFERRING DIAG: [id number] (ICD-10-CM) - Acute pain of right knee   THERAPY DIAG:  Acute pain of right  knee  Muscle weakness (generalized)  Difficulty in walking, not elsewhere classified  Rationale for Evaluation and Treatment: Rehabilitation  ONSET DATE: approx 1 month ago  SUBJECTIVE:   SUBJECTIVE STATEMENT: Pt reports an increase in R Ant/ medial knee pain and along her R posterior/lat leg up into her buttock esp with going up steps.  PERTINENT HISTORY: Arthritis  PAIN:  Are you having pain? Yes: NPRS scale: 7/10 Pain location: ant R knee, lat inf patella pole  Pain description: ache Aggravating factors: walking and steps Relieving factors: pain does not usually last that long   Pain range on eval=0-8/10  PRECAUTIONS: None  RED FLAGS: None   WEIGHT BEARING RESTRICTIONS: No  FALLS:  Has patient fallen in last 6 months? No  LIVING ENVIRONMENT: Lives with: lives with their family Lives in: House/apartment No issue c accessing or mobility within home. 13 steps in home  OCCUPATION: Part-time as Research scientist (life sciences)  PLOF: Independent  PATIENT GOALS: less pain  NEXT MD VISIT: 03/12/23 Dr. Margaretha Sheffield  OBJECTIVE:   DIAGNOSTIC FINDINGS: DG R knee 01/22/23 FINDINGS: Minimal medial tibiofemoral joint space narrowing. There is mild to moderate tricompartmental peripheral spurring. Small knee joint effusion. Fragmented osteophyte about the lateral patella. No fracture or erosive change. No focal  soft tissue abnormalities.   IMPRESSION: Mild-to-moderate tricompartmental osteoarthritis with small joint effusion.  PATIENT SURVEYS:  FOTO: Perceived function   56%, predicted   68%   COGNITION: Overall cognitive status: Within functional limits for tasks assessed     SENSATION: WFL  EDEMA:  None palpated or observed  MUSCLE LENGTH: Hamstrings: Right min tight deg; Left min tight deg Thomas test: Right NT deg; Left NT deg  POSTURE: No Significant postural limitations  PALPATION: Medial joint space, lateral inf patella pole, vastus lateralis/ITB  LOWER  EXTREMITY ROM:  Active ROM Right eval Left eval Rt 02/26/23  Hip flexion     Hip extension     Hip abduction     Hip adduction     Hip internal rotation     Hip external rotation     Knee flexion WNLs WNLs   Knee extension WNLs WNLs   Ankle dorsiflexion 5 10 10   Ankle plantarflexion     Ankle inversion     Ankle eversion      (Blank rows = not tested)  LOWER EXTREMITY MMT:  MMT Right eval Left eval  Hip flexion 5 5  Hip extension 5 5  Hip abduction    Hip adduction 5 5  Hip internal rotation    Hip external rotation 5 5  Knee flexion 5 5  Knee extension 5 5  Ankle dorsiflexion    Ankle plantarflexion    Ankle inversion    Ankle eversion     (Blank rows = not tested)  LOWER EXTREMITY SPECIAL TESTS:  Knee special tests: Anterior drawer test: negative, Posterior drawer test: negative, McMurray's test: negative, Patellafemoral apprehension test: negative, Patellafemoral grind test: negative, and Patella tap test (ballotable patella): negative Pain of the R lateral thigh was provoked with R quad set  FUNCTIONAL TESTS:  5 times sit to stand: 10.8 " s use of arm rests WNLS  GAIT: Distance walked: 200" Assistive device utilized: None Level of assistance: Complete Independence Comments: WNLs, pt was not experiencing pain   TODAY'S TREATMENT:   [facility] Adult PT Treatment:                                                DATE: 03/05/23 Therapeutic Exercise: Forward step ups x10 8" Forward trunk flexion x10 Forward trunk ext x10 SKTC x3 20" DKTC x3 20" PPT x10 5" Bridging x10 5" H/L clam x10 5" R SLR x10 5" Updated HEP Manual Therapy: TTP R greater trochanter  [facility] Adult PT Treatment:                                                DATE: 02/26/23 Therapeutic Exercise: NuStep L5 UE and LE for 5 min  SLR c quad set x 10 in DF, x10 in PF Piriformis strech x2 20" ITB stretch c strap x2 20" Hamstring stretch c strap x2 20" LAQ 4 lbs 2x10 c hip add ball  squeezes Lateral side steps c airex 2x10 Wall slides to 60d c hip add ball Manual Therapy: STM to the R vastus lateralis Skilled palpation to identify TrPs and taut muscle bands Trigger Point Dry Needling Treatment: Pre-treatment instruction: Patient instructed on dry needling rationale, procedures, and possible side  effects including pain during treatment (achy,cramping feeling), bruising, drop of blood, lightheadedness, nausea, sweating. Patient Consent Given: Yes Education handout provided: Yes Muscles treated: R vastus lateralis  Needle size and number: .25x10mm x 2 Electrical stimulation performed: No Parameters: N/A Treatment response/outcome: Twitch response elicited and Palpable decrease in muscle tension Post-treatment instructions: Patient instructed to expect possible mild to moderate muscle soreness later today and/or tomorrow. Patient instructed in methods to reduce muscle soreness and to continue prescribed HEP. If patient was dry needled over the lung field, patient was instructed on signs and symptoms of pneumothorax and, however unlikely, to see immediate medical attention should they occur. Patient was also educated on signs and symptoms of infection and to seek medical attention should they occur. Patient verbalized understanding of these instructions and education.   [facility] Adult PT Treatment:                                                DATE: 02/20/23 Therapeutic Exercise: NuStep L5 UE and LE for 5 min  SLR c quad set x 10 in DF, x10 in PF Piriformis strech x1 20" ITB stretch c strap x2 20" Hip abduction 2x10 LAQ 4 lbs 2x10  NuStep L5 UE and LE for 5 min  Manual Therapy: STM to the R vastus lateralis Skilled palpation to identify TrPs and taut muscle bands Trigger Point Dry Needling Treatment: Pre-treatment instruction: Patient instructed on dry needling rationale, procedures, and possible side effects including pain during treatment (achy,cramping feeling),  bruising, drop of blood, lightheadedness, nausea, sweating. Patient Consent Given: Yes Education handout provided: Yes Muscles treated: R vastus lateralis  Needle size and number: .30x58mm x 1 Electrical stimulation performed: No Parameters: N/A Treatment response/outcome: Twitch response elicited and Palpable decrease in muscle tension Post-treatment instructions: Patient instructed to expect possible mild to moderate muscle soreness later today and/or tomorrow. Patient instructed in methods to reduce muscle soreness and to continue prescribed HEP. If patient was dry needled over the lung field, patient was instructed on signs and symptoms of pneumothorax and, however unlikely, to see immediate medical attention should they occur. Patient was also educated on signs and symptoms of infection and to seek medical attention should they occur. Patient verbalized understanding of these instructions and education.    [facility] Adult PT Treatment:                                                DATE: 02/18/23 Therapeutic Exercise: NuStep L5 UE and LE for 5 min  Calf stretch at Slant board 1 min  Hip abduction x 15  Squat x 10 mini at the bar slow mod cues  LAQ 4 lbs x 15 each LE x 2  SLR 2 x 10 toes up then toes out x 10  Stretching to hip with strap (hamstring x 2 )  Bridge x 10  Bridge with butterfly Sidelying clam green band x 15  Seated piriformis x 3 , 30 sec each  PATIENT EDUCATION:  Education details: Eval findings, POC, HEP, self care  Person educated: Patient and Spouse Education method: Explanation, Demonstration, Tactile cues, Verbal cues, and Handouts Education comprehension: verbalized understanding, returned demonstration, verbal cues required, tactile cues required, and needs further education  HOME EXERCISE PROGRAM: Access Code: [email or web address] URL:  [email or web address] Date: 03/05/2023 Prepared by: Joellyn Rued  Exercises - Supine Hamstring Stretch with Strap  - 2 x daily - 7 x weekly - 1 sets - 3 reps - 30 hold - Supine ITB Stretch with Strap  - 2 x daily - 7 x weekly - 1 sets - 3 reps - 30 hold - Seated Piriformis Stretch (Mirrored)  - 1 x daily - 7 x weekly - 1 sets - 3 reps - 30 hold - Gastroc Stretch on Wall (Mirrored)  - 1 x daily - 7 x weekly - 1 sets - 3 reps - 30 hold - Sidelying Hip Abduction  - 1 x daily - 7 x weekly - 2 sets - 10 reps - 5 hold - Clamshell with Resistance  - 1 x daily - 7 x weekly - 2 sets - 10 reps - 5 hold Current 03/05/23 - Supine Posterior Pelvic Tilt  - 1 x daily - 7 x weekly - 2 sets - 10 reps - 5 hold - Hooklying Clamshell with Resistance  - 1 x daily - 7 x weekly - 2 sets - 10 reps - 5 hold - Active Straight Leg Raise with Quad Set  - 1 x daily - 7 x weekly - 2 sets - 10 reps - 5 hold - Supine Bridge  - 1 x daily - 7 x weekly - 2 sets - 10 reps   ASSESSMENT:  CLINICAL IMPRESSION: Pt reports an increase in R knee pain and post/lat R leg pain esp with asc steps. Pt is not TTP of the pes anserine, but is to the R greater trochanter region. Repeated forward stepping 8", repeated forward trunk flexion, and repeated trunk extension reproduced R post/lat R LE pain. R LE posterior/alt pain seems associated with pt low back. R knee pain seems to be a separate issue. PT was completed today for lumbopelvic flexibility and strengthening to address R posterior/lat LE pain. Pt tolerated PT today without adverse effects. Pt will continue to benefit from skilled PT to address impairments for improved function. Reassess FOTO the next PT session.  OBJECTIVE IMPAIRMENTS: decreased activity tolerance, difficulty walking, decreased ROM, impaired flexibility, and pain.   ACTIVITY LIMITATIONS: squatting, stairs, and locomotion level  PARTICIPATION LIMITATIONS: cleaning, shopping, and community  activity  PERSONAL FACTORS: Time since onset of injury/illness/exacerbation are also affecting patient's functional outcome.   REHAB POTENTIAL: Excellent  CLINICAL DECISION MAKING: Stable/uncomplicated  EVALUATION COMPLEXITY: Low   GOALS:   SHORT TERM GOALS=LTGs   LONG TERM GOALS: Target date: 03/29/23  Pt will be Ind in a final HEP to maintain achieved LOF  Baseline: started Goal status: ongoing   2.  Pt will voice understanding of measures to assist in pain reduction  Baseline: Waiting it out.  No OTC meds. No ice or heat.  Goal status: ongoing   3.  Increased R DF to 10d for improved mechanics with gait Baseline: 5d 02/26/23: 12d Goal status: MET   4.  Pt will report a 50% or greater decrease in R knee pain for improved function and QOL Baseline: 0-8/10 Goal status: ongoing   5.  Pt's FOTO score will improved to the predicted value of 68% as  indication of improved function  Baseline: 56% Goal status: ongoing   6.  Assess and will improve by MCID of 52ft as indication of improved functional mobility if a deficit is found Baseline: 6 min walk 1800 feet +, goal deferred  Goal status: MET, DC GOAL    PLAN:  PT FREQUENCY: 2x/week  PT DURATION: 6 weeks  PLANNED INTERVENTIONS: Therapeutic exercises, Therapeutic activity, Neuromuscular re-education, Balance training, Gait training, Patient/Family education, Self Care, Joint mobilization, Stair training, Aquatic Therapy, Dry Needling, Electrical stimulation, Cryotherapy, Moist heat, Taping, Vasopneumatic device, Ultrasound, Ionotophoresis 4mg /ml Dexamethasone, Manual therapy, and Re-evaluation  PLAN FOR NEXT SESSION: squat form , lifting mech. Review FOTO; assess response to HEP; progress therex as indicated; use of modalities, manual therapy; and TPDN as indicated.  Jaleesa Cervi MS, PT 03/05/23 5:53 PM

## 2023-03-06 ENCOUNTER — Encounter: Payer: Self-pay | Admitting: Cardiology

## 2023-03-07 ENCOUNTER — Ambulatory Visit: Payer: Medicare Other

## 2023-03-07 ENCOUNTER — Other Ambulatory Visit: Payer: Self-pay

## 2023-03-07 DIAGNOSIS — M25561 Pain in right knee: Secondary | ICD-10-CM

## 2023-03-07 DIAGNOSIS — R262 Difficulty in walking, not elsewhere classified: Secondary | ICD-10-CM

## 2023-03-07 DIAGNOSIS — R0609 Other forms of dyspnea: Secondary | ICD-10-CM

## 2023-03-07 DIAGNOSIS — M6281 Muscle weakness (generalized): Secondary | ICD-10-CM | POA: Diagnosis not present

## 2023-03-07 DIAGNOSIS — I700 Atherosclerosis of aorta: Secondary | ICD-10-CM | POA: Diagnosis not present

## 2023-03-07 MED ORDER — ASPIRIN 81 MG PO CHEW
81.0000 mg | CHEWABLE_TABLET | Freq: Every day | ORAL | 3 refills | Status: DC
Start: 2023-03-07 — End: 2023-04-10

## 2023-03-07 NOTE — Therapy (Addendum)
OUTPATIENT PHYSICAL THERAPY TREATMENT/Discharge   Patient Name: Gina Johns MRN: [id number] DOB:1956-01-28, 68 y.o., female Today's Date: 03/07/2023  END OF SESSION:  PT End of Session - 03/07/23 1606     Visit Number 9    Number of Visits 13    Date for PT Re-Evaluation 03/29/23    Authorization Type [employer] MEDICARE    PT Start Time 1557    PT Stop Time 1642    PT Time Calculation (min) 45 min    Activity Tolerance Patient tolerated treatment well    Behavior During Therapy WFL for tasks assessed/performed                    Past Medical History:  Diagnosis Date   Anxiety    Arthritis    Breast cancer (HCC) 12/07/2011   right breast 2x3 cm   Depression    " sometimes"   Hypercholesterolemia    PMH   Right knee pain 08/26/2012   Past Surgical History:  Procedure Laterality Date   BREAST LUMPECTOMY WITH AXILLARY LYMPH NODE BIOPSY     CESAREAN SECTION     Hx: of times 2   MASS EXCISION Right 02/25/2014   Procedure: EXCISION MASS RIGHT CHEST WALL;  Surgeon: Chevis Pretty III, MD;  Location: [facility] OR;  Service: General;  Laterality: Right;   MASTECTOMY Right    TOTAL MASTECTOMY Right 03/20/2013   Procedure: TOTAL MASTECTOMY;  Surgeon: Robyne Askew, MD;  Location: 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   Lymphedema 10/19/2013   Breast cancer of upper-outer quadrant of right female breast (HCC) 03/06/2013   Dyslipidemia 01/22/2013   Right knee pain 08/26/2012    PCP: Chilton Greathouse, MD  REFERRING PROVIDER: Ralene Cork, DO  REFERRING DIAG: [phone] (ICD-10-CM) - Acute pain of right knee   THERAPY DIAG:  Acute  pain of right knee  Muscle weakness (generalized)  Difficulty in walking, not elsewhere classified  Rationale for Evaluation and Treatment: Rehabilitation  ONSET DATE: approx 1 month ago  SUBJECTIVE:   SUBJECTIVE STATEMENT: Pt continues to report intermittent pain running from her R knee up her posterior/lateral thigh to her buttock. Overall, pt reports 25% in her R knee   PERTINENT HISTORY: Arthritis  PAIN:  Are you having pain? Yes: NPRS scale: 0/10 Pain location: ant R knee, lat inf patella pole  Pain description: ache Aggravating factors: walking and steps Relieving factors: pain does not usually last that long   Pain range on eval=0-8/10  PRECAUTIONS: None  RED FLAGS: None   WEIGHT BEARING RESTRICTIONS: No  FALLS:  Has patient fallen in last 6 months? No  LIVING ENVIRONMENT: Lives with: lives with their family Lives in: House/apartment No issue c accessing or mobility within home. 13 steps in home  OCCUPATION: Part-time as Research scientist (life sciences)  PLOF: Independent  PATIENT GOALS: less pain  NEXT MD VISIT: 03/12/23 Dr. Margaretha Sheffield  OBJECTIVE:   DIAGNOSTIC FINDINGS: DG R knee 01/22/23 FINDINGS: Minimal medial tibiofemoral joint space narrowing. There is mild to moderate tricompartmental peripheral spurring. Small knee joint effusion. Fragmented osteophyte about the lateral patella. No fracture or erosive  change. No focal soft tissue abnormalities.   IMPRESSION: Mild-to-moderate tricompartmental osteoarthritis with small joint effusion.  PATIENT SURVEYS:  FOTO: Perceived function   56%, predicted   68%   COGNITION: Overall cognitive status: Within functional limits for tasks assessed     SENSATION: WFL  EDEMA:  None palpated or observed  MUSCLE LENGTH: Hamstrings: Right min tight deg; Left min tight deg Thomas test: Right NT deg; Left NT deg  POSTURE: No Significant postural limitations  PALPATION: Medial joint space, lateral inf patella pole,  vastus lateralis/ITB  LOWER EXTREMITY ROM:  Active ROM Right eval Left eval Rt 02/26/23  Hip flexion     Hip extension     Hip abduction     Hip adduction     Hip internal rotation     Hip external rotation     Knee flexion WNLs WNLs   Knee extension WNLs WNLs   Ankle dorsiflexion 5 10 10   Ankle plantarflexion     Ankle inversion     Ankle eversion      (Blank rows = not tested)  LOWER EXTREMITY MMT:  MMT Right eval Left eval  Hip flexion 5 5  Hip extension 5 5  Hip abduction    Hip adduction 5 5  Hip internal rotation    Hip external rotation 5 5  Knee flexion 5 5  Knee extension 5 5  Ankle dorsiflexion    Ankle plantarflexion    Ankle inversion    Ankle eversion     (Blank rows = not tested)  LOWER EXTREMITY SPECIAL TESTS:  Knee special tests: Anterior drawer test: negative, Posterior drawer test: negative, McMurray's test: negative, Patellafemoral apprehension test: negative, Patellafemoral grind test: negative, and Patella tap test (ballotable patella): negative Pain of the R lateral thigh was provoked with R quad set  FUNCTIONAL TESTS:  5 times sit to stand: 10.8 " s use of arm rests WNLS  GAIT: Distance walked: 200" Assistive device utilized: None Level of assistance: Complete Independence Comments: WNLs, pt was not experiencing pain   TODAY'S TREATMENT: [facility] Adult PT Treatment:                                                DATE: 03/07/23 Therapeutic Exercise: NuStep L5 UE and LE for 6 min  Calf stretch at Slant board 1 min  S/L Hip abduction 2x10 Sidelying clam green band 2x10  SLR 2 x 10 toes up then toes out x 10  Squat x 10 mini at the bar slow mod cues  LAQ 4 lbs x 15 each LE x 2  SLR 2 x 10 toes up then toes out x 10  Bridge 2 x 10  Supine hamstring stretch x2 30" Supine ITB stretch x2 30" Seated piriformis x 2 30' Manual Therapy: STM with MTPR to the R vastus lateralis and TFL     [facility] Adult PT Treatment:                                                 DATE: 03/05/23 Therapeutic Exercise: Forward step ups x10 8" Forward trunk flexion x10 Forward trunk ext x10 SKTC x3 20" DKTC x3 20" PPT x10 5" Bridging x10 5" H/L  clam x10 5" R SLR x10 5" Updated HEP Manual Therapy: TTP R greater trochanter  [facility] Adult PT Treatment:                                                DATE: 02/26/23 Therapeutic Exercise: NuStep L5 UE and LE for 5 min  SLR c quad set x 10 in DF, x10 in PF Piriformis strech x2 20" ITB stretch c strap x2 20" Hamstring stretch c strap x2 20" LAQ 4 lbs 2x10 c hip add ball squeezes Lateral side steps c airex 2x10 Wall slides to 60d c hip add ball Manual Therapy: STM to the R vastus lateralis Skilled palpation to identify TrPs and taut muscle bands Trigger Point Dry Needling Treatment: Pre-treatment instruction: Patient instructed on dry needling rationale, procedures, and possible side effects including pain during treatment (achy,cramping feeling), bruising, drop of blood, lightheadedness, nausea, sweating. Patient Consent Given: Yes Education handout provided: Yes Muscles treated: R vastus lateralis  Needle size and number: .25x75mm x 2 Electrical stimulation performed: No Parameters: N/A Treatment response/outcome: Twitch response elicited and Palpable decrease in muscle tension Post-treatment instructions: Patient instructed to expect possible mild to moderate muscle soreness later today and/or tomorrow. Patient instructed in methods to reduce muscle soreness and to continue prescribed HEP. If patient was dry needled over the lung field, patient was instructed on signs and symptoms of pneumothorax and, however unlikely, to see immediate medical attention should they occur. Patient was also educated on signs and symptoms of infection and to seek medical attention should they occur. Patient verbalized understanding of these instructions and education.   [facility] Adult PT Treatment:                                                 DATE: 02/20/23 Therapeutic Exercise: NuStep L5 UE and LE for 5 min  SLR c quad set x 10 in DF, x10 in PF Piriformis strech x1 20" ITB stretch c strap x2 20" Hip abduction 2x10 LAQ 4 lbs 2x10  NuStep L5 UE and LE for 5 min  Manual Therapy: STM to the R vastus lateralis Skilled palpation to identify TrPs and taut muscle bands Trigger Point Dry Needling Treatment: Pre-treatment instruction: Patient instructed on dry needling rationale, procedures, and possible side effects including pain during treatment (achy,cramping feeling), bruising, drop of blood, lightheadedness, nausea, sweating. Patient Consent Given: Yes Education handout provided: Yes Muscles treated: R vastus lateralis  Needle size and number: .30x65mm x 1 Electrical stimulation performed: No Parameters: N/A Treatment response/outcome: Twitch response elicited and Palpable decrease in muscle tension Post-treatment instructions: Patient instructed to expect possible mild to moderate muscle soreness later today and/or tomorrow. Patient instructed in methods to reduce muscle soreness and to continue prescribed HEP. If patient was dry needled over the lung field, patient was instructed on signs and symptoms of pneumothorax and, however unlikely, to see immediate medical attention should they occur. Patient was also educated on signs and symptoms of infection and to seek medical attention should they occur. Patient verbalized understanding of these instructions and education.    [facility] Adult PT Treatment:  DATE: 02/18/23 Therapeutic Exercise: NuStep L5 UE and LE for 5 min  Calf stretch at Slant board 1 min  Hip abduction x 15  Squat x 10 mini at the bar slow mod cues  LAQ 4 lbs x 15 each LE x 2  SLR 2 x 10 toes up then toes out x 10  Stretching to hip with strap (hamstring x 2 )  Bridge x 10  Bridge with butterfly Sidelying clam green band x 15  Seated piriformis x 3  , 30 sec each                                                                                                                     PATIENT EDUCATION:  Education details: Eval findings, POC, HEP, self care  Person educated: Patient and Spouse Education method: Explanation, Demonstration, Tactile cues, Verbal cues, and Handouts Education comprehension: verbalized understanding, returned demonstration, verbal cues required, tactile cues required, and needs further education  HOME EXERCISE PROGRAM: Access Code: [email or web address] URL: [email or web address] Date: 03/05/2023 Prepared by: Joellyn Rued  Exercises - Supine Hamstring Stretch with Strap  - 2 x daily - 7 x weekly - 1 sets - 3 reps - 30 hold - Supine ITB Stretch with Strap  - 2 x daily - 7 x weekly - 1 sets - 3 reps - 30 hold - Seated Piriformis Stretch (Mirrored)  - 1 x daily - 7 x weekly - 1 sets - 3 reps - 30 hold - Gastroc Stretch on Wall (Mirrored)  - 1 x daily - 7 x weekly - 1 sets - 3 reps - 30 hold - Sidelying Hip Abduction  - 1 x daily - 7 x weekly - 2 sets - 10 reps - 5 hold - Clamshell with Resistance  - 1 x daily - 7 x weekly - 2 sets - 10 reps - 5 hold Current 03/05/23 - Supine Posterior Pelvic Tilt  - 1 x daily - 7 x weekly - 2 sets - 10 reps - 5 hold - Hooklying Clamshell with Resistance  - 1 x daily - 7 x weekly - 2 sets - 10 reps - 5 hold - Active Straight Leg Raise with Quad Set  - 1 x daily - 7 x weekly - 2 sets - 10 reps - 5 hold - Supine Bridge  - 1 x daily - 7 x weekly - 2 sets - 10 reps   ASSESSMENT:  CLINICAL IMPRESSION: PT was provided for STM c MTPR to the R vastus lateralis and TFL f/b therex for R knee/LE flexibility and strengthening. Pt tolerated the PT session without the development of R knee pain. Pt's FOTO for perceived level of function was reassessed and found improved vs the eval which is consistent with pt's report of 25% improvement in her R knee pain. To date, pt's progress has been slower  than anticipated. 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 Pt has a return appt with Dr. Margaretha Sheffield on 03/12/23 with pt  having one more scheduled PT appt on 9/19. Will continue or discharge pt from PT per Dr. Gilmer Mor recommendation.  OBJECTIVE IMPAIRMENTS: decreased activity tolerance, difficulty walking, decreased ROM, impaired flexibility, and pain.   ACTIVITY LIMITATIONS: squatting, stairs, and locomotion level  PARTICIPATION LIMITATIONS: cleaning, shopping, and community activity  PERSONAL FACTORS: Time since onset of injury/illness/exacerbation are also affecting patient's functional outcome.   REHAB POTENTIAL: Excellent  CLINICAL DECISION MAKING: Stable/uncomplicated  EVALUATION COMPLEXITY: Low   GOALS:   SHORT TERM GOALS=LTGs   LONG TERM GOALS: Target date: 03/29/23  Pt will be Ind in a final HEP to maintain achieved LOF  Baseline: started Goal status: Ongoing   2.  Pt will voice understanding of measures to assist in pain reduction  Baseline: Waiting it out.  No OTC meds. No ice or heat.  Goal status: Ongoing   3.  Increased R DF to 10d for improved mechanics with gait Baseline: 5d 02/26/23: 12d Goal status: MET   4.  Pt will report a 50% or greater decrease in R knee pain for improved function and QOL Baseline: 0-8/10 03/07/23: 25% improvement Goal status: Improved  5.  Pt's FOTO score will improved to the predicted value of 68% as indication of improved function  Baseline: 56% 03/07/23: 64% Goal status: Improved  6.  Assess and will improve by MCID of 70ft as indication of improved functional mobility if a deficit is found Baseline: 6 min walk 1800 feet +, goal deferred  Goal status: MET, DC GOAL    PLAN:  PT FREQUENCY: 2x/week  PT DURATION: 6 weeks  PLANNED INTERVENTIONS: Therapeutic exercises, Therapeutic activity, Neuromuscular re-education, Balance training, Gait training, Patient/Family education, Self Care, Joint mobilization,  Stair training, Aquatic Therapy, Dry Needling, Electrical stimulation, Cryotherapy, Moist heat, Taping, Vasopneumatic device, Ultrasound, Ionotophoresis 4mg /ml Dexamethasone, Manual therapy, and Re-evaluation  PLAN FOR NEXT SESSION: squat form , lifting mech. Review FOTO; assess response to HEP; progress therex as indicated; use of modalities, manual therapy; and TPDN as indicated.  Gerson Fauth MS, PT 03/07/23 5:39 PM  PHYSICAL THERAPY DISCHARGE SUMMARY  Visits from Start of Care: 9  Current functional level related to goals / functional outcomes: See clinical impression and PT goals    Remaining deficits: See clinical impression and PT goals    Education / Equipment: HEP/, pt Ed   Patient agrees to discharge. Patient goals were partially met. Patient is being discharged due to not returning since the last visit.  Greyson Peavy MS, PT 04/19/23 7:57 AM

## 2023-03-08 IMAGING — MG DIGITAL SCREENING UNILAT LEFT W/ TOMO W/ CAD
4 series · 4 of 12 positions shown · non-contrast
Comparison: Previous exam(s).

CLINICAL DATA: Screening.

EXAM:
DIGITAL SCREENING UNILATERAL LEFT MAMMOGRAM WITH CAD AND
TOMOSYNTHESIS
TECHNIQUE: Left screening digital craniocaudal and mediolateral oblique
mammograms were obtained. Left screening digital breast
tomosynthesis was performed. The images were evaluated with
computer-aided detection.

[L CC synth-2D]
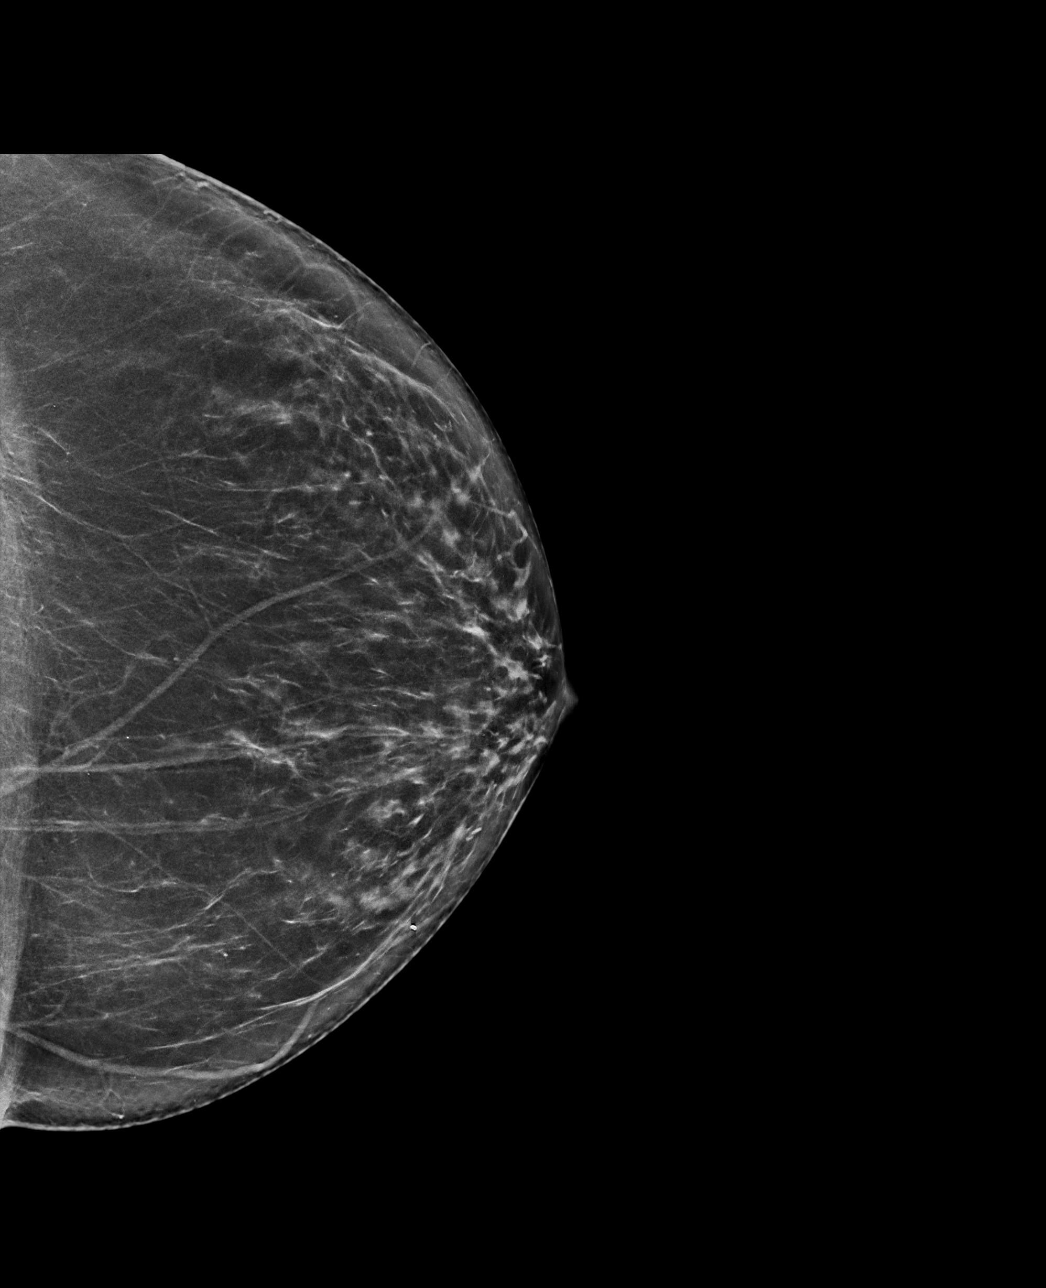

[L MLO synth-2D]
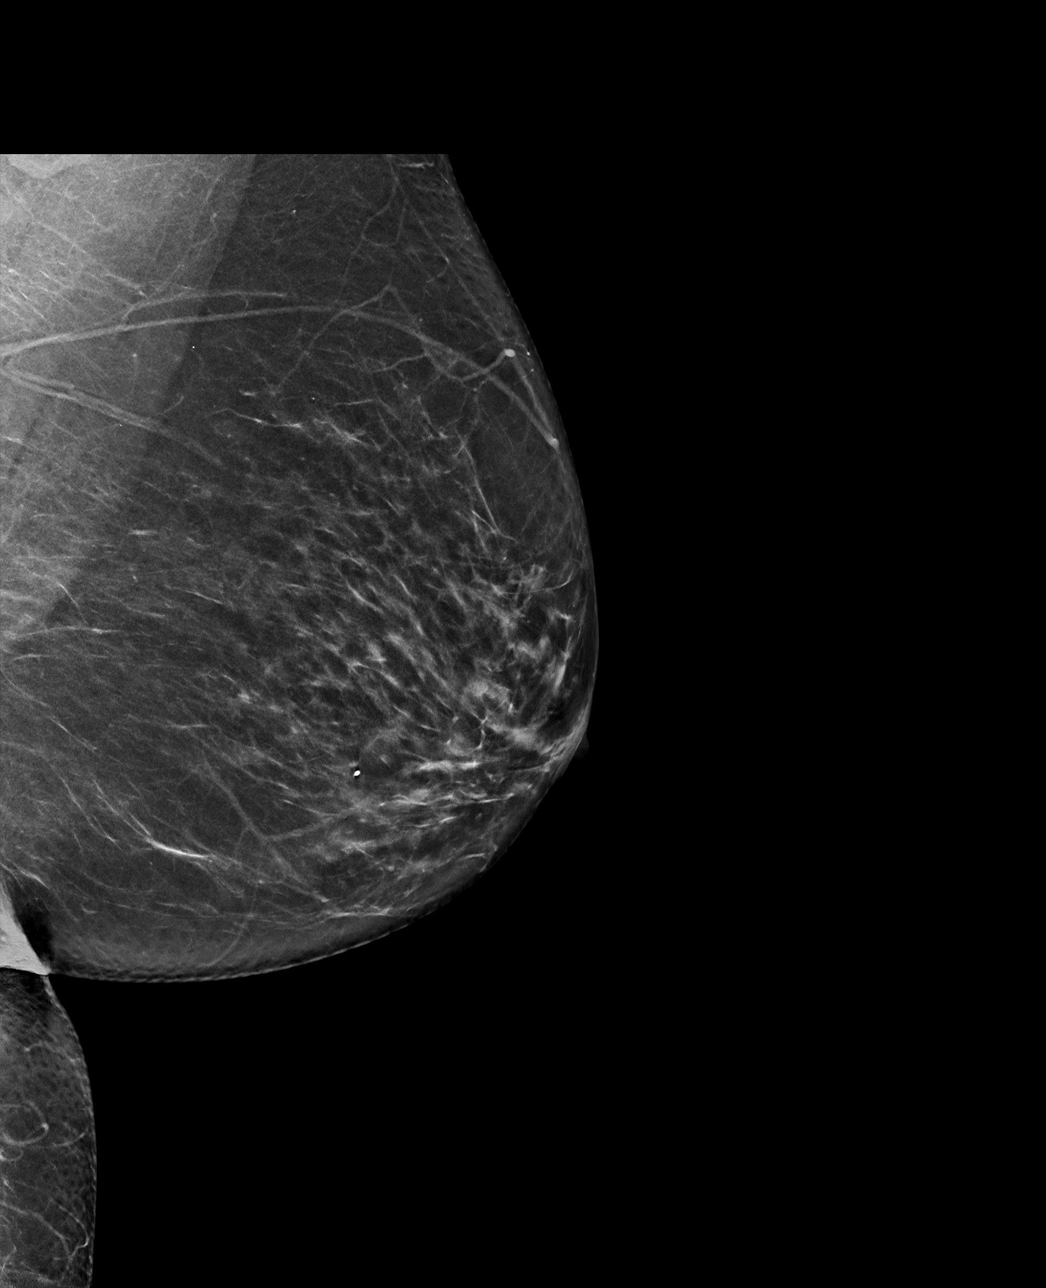

[L CC tomo · tomo slice 35/69.0]
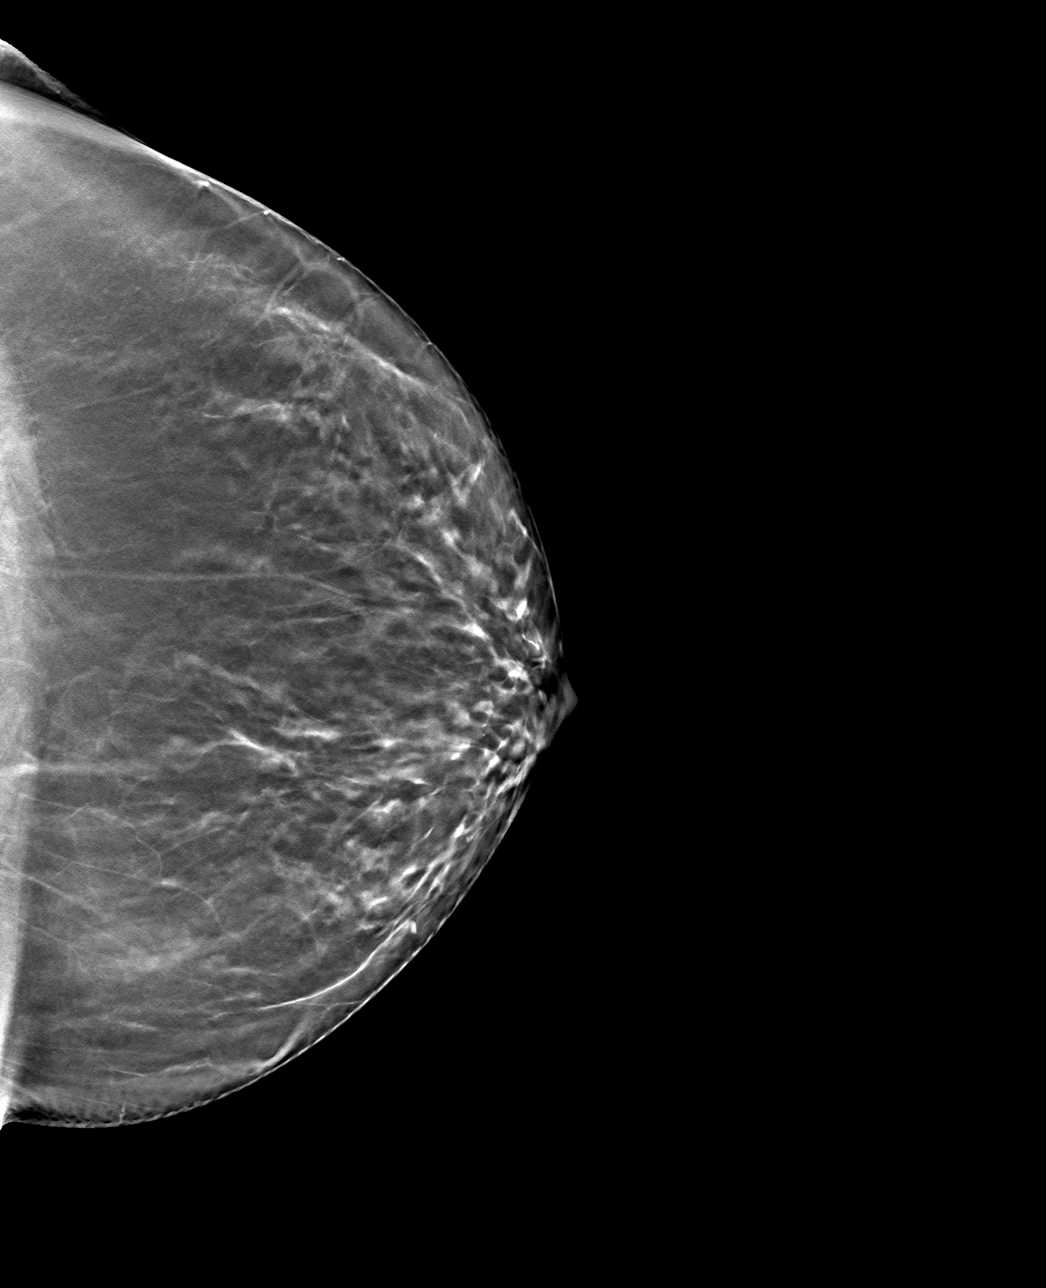

[L MLO tomo · tomo slice 37/72.0]
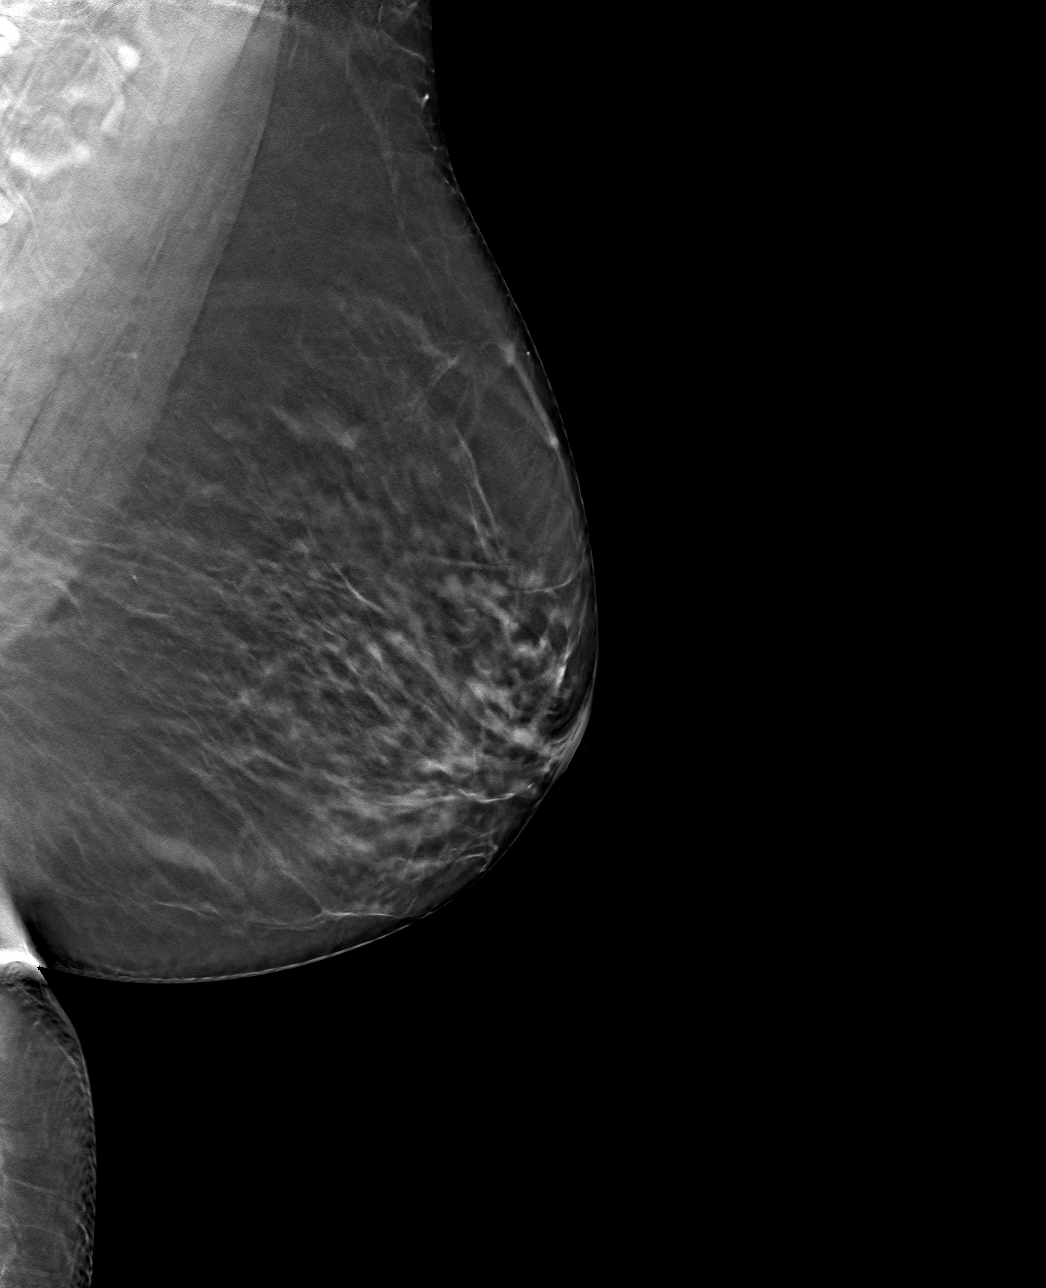

[4 of 12 positions shown; findings below may reference images not displayed]

ACR Breast Density Category b: There are scattered areas of
fibroglandular density.
FINDINGS: There are no findings suspicious for malignancy.
IMPRESSION: No mammographic evidence of malignancy. A result letter of this
screening mammogram will be mailed directly to the patient.

RECOMMENDATION:
Screening mammogram in one year. (Code:EO-6-XP6)

BI-RADS CATEGORY  1: Negative.

## 2023-03-12 ENCOUNTER — Ambulatory Visit: Payer: Medicare Other | Admitting: Sports Medicine

## 2023-03-12 VITALS — BP 135/76 | Ht 64.5 in | Wt 160.0 lb

## 2023-03-12 DIAGNOSIS — M25561 Pain in right knee: Secondary | ICD-10-CM

## 2023-03-12 NOTE — Progress Notes (Signed)
Subjective:    Patient ID: Gina Johns, female    DOB: 10-09-1955, 67 y.o.   MRN: [id number]  HPI  Patient is following up on right knee pain.  She has had several sessions of physical therapy.  Today her pain is along the area of her hamstring and not in her knee.  This started after her last office visit with me.  It is most noticeable with going up stairs.  No pain with coming downstairs.  She has another physical therapy visit scheduled for tomorrow.  Review of Systems As above    Objective:   Physical Exam  Well-developed, well-nourished.  No acute distress  Right knee: Full range of motion.  No effusion.  No tenderness to palpation along the medial lateral joint lines.  Negative McMurray's.  There is some tenderness to palpation along the mid to distal hamstring tendons.  No palpable defect.      Assessment & Plan:   Right leg pain likely secondary to hamstring strain  Patient reviewed in detail with me the exercises she is doing in physical therapy.  I believe her current pain is likely secondary to a hamstring strain secondary to some of those exercises.  I recommended that she forego tomorrow's physical therapy visit and stop physical therapy for now.  She will follow-up with me again in 3 to 4 weeks.  If her knee pain once again returns then we will consider cortisone injection.  This note was dictated using Dragon naturally speaking software and may contain errors in syntax, spelling, or content which have not been identified prior to signing this note.

## 2023-03-13 DIAGNOSIS — L819 Disorder of pigmentation, unspecified: Secondary | ICD-10-CM | POA: Diagnosis not present

## 2023-03-13 DIAGNOSIS — L578 Other skin changes due to chronic exposure to nonionizing radiation: Secondary | ICD-10-CM | POA: Diagnosis not present

## 2023-03-14 ENCOUNTER — Ambulatory Visit: Payer: Medicare Other

## 2023-03-20 NOTE — Progress Notes (Signed)
Abnormal nuclear stress test and will arrange follow up visit in the next approx 2 weeks. I will discuss further steps if she needs cardiac cath or medical therapy. Excellent effort on the treadmill. I messaged her on Mychart

## 2023-03-29 ENCOUNTER — Ambulatory Visit: Payer: Self-pay | Admitting: Cardiology

## 2023-04-03 ENCOUNTER — Telehealth: Payer: Self-pay | Admitting: *Deleted

## 2023-04-03 NOTE — Telephone Encounter (Signed)
Patient is returning call.  °

## 2023-04-03 NOTE — Telephone Encounter (Signed)
I spoke with patient and reviewed results with her.  Appointment offered with Dr Jacinto Halim tomorrow at 10:40.  Patient unable to come in at that time.  Needs appointment after 3:30.  Will check schedule and call her back to schedule appointment

## 2023-04-03 NOTE — Telephone Encounter (Signed)
Left message to call office

## 2023-04-03 NOTE — Telephone Encounter (Signed)
-----   Message from [facility] M sent at 04/03/2023  2:02 PM EDT -----  ----- Message ----- From: Yates Decamp, MD Sent: 03/20/2023  10:50 PM EDT To: Deliah Boston, RN  Abnormal nuclear stress test and will arrange follow up visit in the next approx 2 weeks. I will discuss further steps if she needs cardiac cath or medical therapy. Excellent effort on the treadmill. I messaged her on Mychart

## 2023-04-03 NOTE — Telephone Encounter (Signed)
I spoke with patient and scheduled her to see Dr Jacinto Halim on October 16 at 4:20

## 2023-04-05 ENCOUNTER — Other Ambulatory Visit (HOSPITAL_COMMUNITY): Payer: Medicare Other

## 2023-04-09 ENCOUNTER — Encounter: Payer: Self-pay | Admitting: Sports Medicine

## 2023-04-09 ENCOUNTER — Ambulatory Visit: Payer: Medicare Other | Admitting: Sports Medicine

## 2023-04-09 VITALS — BP 118/70 | Ht 64.5 in | Wt 160.0 lb

## 2023-04-09 DIAGNOSIS — M7582 Other shoulder lesions, left shoulder: Secondary | ICD-10-CM | POA: Diagnosis not present

## 2023-04-09 DIAGNOSIS — E7800 Pure hypercholesterolemia, unspecified: Secondary | ICD-10-CM | POA: Diagnosis not present

## 2023-04-09 DIAGNOSIS — I700 Atherosclerosis of aorta: Secondary | ICD-10-CM | POA: Diagnosis not present

## 2023-04-09 MED ORDER — METHYLPREDNISOLONE ACETATE 40 MG/ML IJ SUSP
40.0000 mg | Freq: Once | INTRAMUSCULAR | Status: AC
Start: 2023-04-09 — End: 2023-04-09
  Administered 2023-04-09: 40 mg via INTRA_ARTICULAR

## 2023-04-09 NOTE — Progress Notes (Signed)
   Subjective:    Patient ID: Gina Johns, female    DOB: 05/29/1956, 67 y.o.   MRN: [id number]  HPI  Patient presents today for follow-up on right knee pain.  She is still having pain particular with navigating stairs.  She is also having left shoulder pain has been present for about 3 weeks.  Shoulder pain is worse than the knee pain.  She has pain with reaching away from her body.  She localizes it to the lateral shoulder.  She is having trouble sleeping at night.  No trauma.  Review of Systems As above    Objective:   Physical Exam  Well-developed, well-nourished.  No acute distress  Left shoulder: Full shoulder range of motion with positive painful arc.  Rotator cuff strength is 5/5 but reproducible pain with resisted supraspinatus.  Neurovascularly intact distally.  Right knee: Full range of motion.  No effusion.  No tenderness to palpation.      Assessment & Plan:   Left shoulder pain likely secondary to rotator cuff tendinitis/subacromial bursitis Persistent right knee pain likely secondary to DJD  Left shoulder is injected today with cortisone.  This was done atraumatically under sterile technique and the patient tolerated this without difficulty.  She return to the office in 3 weeks for reevaluation of both the left shoulder and right knee.  If right knee pain persist at follow-up then consider cortisone injection there.  Consent obtained and verified. Time-out conducted. Noted no overlying erythema, induration, or other signs of local infection. Skin prepped in a sterile fashion. Topical analgesic spray: Ethyl chloride. Joint: Left shoulder, subacromial Needle: 25-gauge 1.5 inch Completed without difficulty. Meds: Cc 1% Xylocaine, 1 cc (40 mg) Depo-Medrol  This note was dictated using Dragon naturally speaking software and may contain errors in syntax, spelling, or content which have not been identified prior to signing this note.

## 2023-04-10 ENCOUNTER — Ambulatory Visit (HOSPITAL_COMMUNITY): Payer: Medicare Other

## 2023-04-10 ENCOUNTER — Encounter: Payer: Self-pay | Admitting: Cardiology

## 2023-04-10 ENCOUNTER — Ambulatory Visit: Payer: Medicare Other | Admitting: Cardiology

## 2023-04-10 ENCOUNTER — Ambulatory Visit (HOSPITAL_COMMUNITY): Payer: Medicare Other | Attending: Internal Medicine

## 2023-04-10 VITALS — BP 126/74 | HR 63 | Resp 16 | Ht 64.0 in | Wt 160.8 lb

## 2023-04-10 DIAGNOSIS — R0609 Other forms of dyspnea: Secondary | ICD-10-CM | POA: Diagnosis not present

## 2023-04-10 DIAGNOSIS — E7800 Pure hypercholesterolemia, unspecified: Secondary | ICD-10-CM | POA: Diagnosis not present

## 2023-04-10 DIAGNOSIS — I700 Atherosclerosis of aorta: Secondary | ICD-10-CM | POA: Diagnosis not present

## 2023-04-10 DIAGNOSIS — R9439 Abnormal result of other cardiovascular function study: Secondary | ICD-10-CM | POA: Diagnosis not present

## 2023-04-10 MED ORDER — METAMUCIL 4 IN 1 FIBER 55.6 % PO POWD: 1.0000 | Freq: Every evening | ORAL | 3 refills | Status: AC

## 2023-04-10 MED ORDER — ASPIRIN 81 MG PO CHEW: 81.0000 mg | CHEWABLE_TABLET | Freq: Every day | ORAL | 3 refills | Status: DC

## 2023-04-10 MED ORDER — EZETIMIBE-SIMVASTATIN 10-40 MG PO TABS: 1.0000 | ORAL_TABLET | Freq: Every evening | ORAL | 3 refills | Status: DC

## 2023-04-10 NOTE — Patient Instructions (Addendum)
Medication Instructions:  Your physician has recommended you make the following change in your medication:   1) START metamucil take 1 scoop every evening with water.  *If you need a refill on your cardiac medications before your next appointment, please call your pharmacy*  Lab Work: None ordered today.  Testing/Procedures: Your physician has requested that you have a coronary calcium score performed. This is not covered by insurance and will be an out-of-pocket cost of approximately $99.   Follow-Up: At [facility], you and your health needs are our priority.  As part of our continuing mission to provide you with exceptional heart care, we have created designated Provider Care Teams.  These Care Teams include your primary Cardiologist (physician) and Advanced Practice Providers (APPs -  Physician Assistants and Nurse Practitioners) who all work together to provide you with the care you need, when you need it.  Your next appointment:   6 month(s)  The format for your next appointment:   In Person  Provider:   Yates Decamp, MD

## 2023-04-10 NOTE — Progress Notes (Addendum)
Cardiology Office Note:  .   Date:  04/11/2023  ID:  Gina Johns, DOB 1955/07/07, MRN [id number] PCP: Chilton Greathouse, MD  [facility] Providers Cardiologist:  Yates Decamp, MD    History of Present Illness: .   Gina Johns is a 67 y.o. Asian [place] female patient with history of breast cancer lymph node negative SP right mastectomy + radiation therapy to the chest in 2016, right arm lymphedema, hypercholesterolemia, aortic atherosclerosis and calcification of aorta noted on chest x-ray in 2021, reactive airway disease referred to me for evaluation of worsening dyspnea on exertion, fatigue.  I had seen her on 03/05/2023, underwent echocardiogram and stress test and presents for follow-up.  Discussed the use of AI scribe software for clinical note transcription with the patient, who gave verbal consent to proceed.  History of Present Illness   The patient, with a history of high cholesterol and reactive airway disease, presents for a follow-up visit. She has been experiencing shortness of breath, but reports a good exercise capacity. The patient has been on cholesterol medication (Vytorin 10/40) and has seen a significant reduction in her cholesterol levels. She also reports an improvement in her reactive airway disease symptoms, no longer requiring an inhaler. The patient has previously undergone radiation therapy to the chest. She has a diet high in fried foods and has been advised to make dietary changes. The patient's husband mentions that the patient has been experiencing leg pain and had a recent episode of severe shoulder pain, which was evaluated by another doctor.     Review of Systems  Cardiovascular:  Negative for chest pain, dyspnea on exertion and leg swelling.   Risk Assessment/Calculations:    Lab Results  Component Value Date   CHOL 162 04/09/2023   HDL 48 04/09/2023   LDLCALC 87 04/09/2023   TRIG 159 (H) 04/09/2023   CHOLHDL 5.6 02/22/2014   Lab Results   Component Value Date   NA 141 02/22/2014   K 4.3 02/22/2014   CO2 23 02/22/2014   GLUCOSE 96 02/22/2014   BUN 11.9 02/22/2014   CREATININE 0.9 02/22/2014   CALCIUM 9.4 02/22/2014   Lab Results  Component Value Date   WBC 9.3 11/14/2015   HGB 13.2 11/14/2015   HCT 39.7 11/14/2015   MCV 75.7 (L) 11/14/2015   PLT 350.0 11/14/2015    External Labs:  Labs 10/16/2022:   Serum glucose 98 mg, BUN 12, creatinine 0.9, EGFR 62.6/75.8 mL, LFTs normal.   Hb 13.4/HCT 39.2, platelets 249, normal indicis.   Total cholesterol 264, triglycerides 197, HDL 44, LDL 181.  Non-HDL cholesterol 220.  Apolipoprotein B 147.   Vitamin D 27.4.  Uric acid 7.4.  TSH 3.140.  Physical Exam:   VS:  BP 126/74 (BP Location: Left Arm, Patient Position: Sitting, Cuff Size: Normal)   Pulse 63   Resp 16   Ht 5\' 4"  (1.626 m)   Wt 160 lb 12.8 oz (72.9 kg)   LMP 03/04/2013   SpO2 98%   BMI 27.60 kg/m    Wt Readings from Last 3 Encounters:  04/10/23 160 lb 12.8 oz (72.9 kg)  04/09/23 160 lb (72.6 kg)  03/12/23 160 lb (72.6 kg)    Physical Exam Neck:     Vascular: No carotid bruit or JVD.  Cardiovascular:     Rate and Rhythm: Normal rate and regular rhythm.     Pulses: Intact distal pulses.     Heart sounds: Normal heart sounds. No murmur heard.  No gallop.  Pulmonary:     Effort: Pulmonary effort is normal.     Breath sounds: Normal breath sounds.  Abdominal:     General: Bowel sounds are normal.     Palpations: Abdomen is soft.  Musculoskeletal:     Right lower leg: No edema.     Left lower leg: No edema.    Studies Reviewed: [name]    EKG:    EKG Interpretation Date/Time:  Wednesday April 10 2023 16:47:08 EDT Ventricular Rate:  64 PR Interval:  168 QRS Duration:  74 QT Interval:  420 QTC Calculation: 433 R Axis:   20  Text Interpretation: EKG 04/10/2023: Normal sinus rhythm at the rate of 64 bpm, no evidence of ischemia, normal EKG. Confirmed by Delrae Rend [phone]) on  04/10/2023 9:16:20 PM   EKG 03/04/2023: Normal sinus rhythm with rate of 63 bpm, left atrial enlargement. No evidence of ischemia.   Chest x-ray two-view 11/26/2019: Normal heart size and pulmonary vascularity.  Prominent epicardial fat pad at LEFT cardiophrenic angle unchanged.  Atherosclerotic calcification aorta.  Lungs clear. No pleural effusion or pneumothorax.  Post RIGHT mastectomy.  Exercise nuclear stress test 03/07/2023: Myocardial perfusion is normal. TID ratio 1.39, which is abnormal. Overall LV systolic function is normal without regional wall motion abnormalities. Stress LV EF: 69%.  Abnormal ECG stress. Resting EKG demonstrated normal sinus rhythm.  Stress EKG is positive for myocardial ischemia with 2 mm horizontal ST segment depression in the inferior and lateral leads persisting for >3 minutes into recovery. The blood pressure response was normal. The patient exercised for 9 minutes and 0 seconds of a Bruce protocol, achieving approximately 10.16 METs & 95%  MPHR. Stress terminated due to THR achieved and dyspnea. Excellent effort.  No previous exam available for comparison. High risk study due to abnormal TID ratio and abnormal EKG response, balanced ischemia cannot be excluded.      ASSESSMENT AND PLAN: .      ICD-10-CM   1. Abnormal cardiovascular stress test  R94.39 EKG 12-Lead    CT CARDIAC SCORING    2. Dyspnea on exertion  R06.09 EKG 12-Lead    3. Aortic atherosclerosis (HCC)  I70.0 EKG 12-Lead    CT CARDIAC SCORING    aspirin (ASPIRIN CHILDRENS) 81 MG chewable tablet    4. Pure hypercholesterolemia  E78.00 EKG 12-Lead    CT CARDIAC SCORING    ezetimibe-simvastatin (VYTORIN) 10-40 MG tablet      Assessment and Plan    Abnormal exercise nuclear stress test in the form of abnormal EKG findings and abnormal 3 times daily ratio. Stress test showed EKG abnormalities suggestive of balanced ischemia, but patient had good exercise capacity and normal heart  function. Patient has high cholesterol, but it has significantly decreased with Vytorin 10/40. Patient has no chest pain, a common symptom of blockages.  -Continue Vytorin 10/40 daily. -Add daily Metamucil for constipation and to further reduce cholesterol. -Add daily flaxseed flakes to diet. -Order coronary calcium score to assess further cardiac risk stratification and possible consideration for PCSK9 inhibitors -Recheck cholesterol in 6 months. -Consider heart catheterization if new symptoms develop.  Reactive Airway Disease Patient reports improvement in wheezing since starting Vytorin, with patient now essentially being asymptomatic without dyspnea.  I am beginning to wonder whether her symptoms of dyspnea were related to significant CAD which has now improved with statin therapy. -Continue current management. -To call if symptoms get worse and her dyspnea may be most consistent with anginal equivalent  as her symptoms have improved with statin and ASA therapy.   With aortic atherosclerosis noted on CT scan, she is now on appropriate medical therapy with aspirin and statin, continue same.  Blood pressure is soft hence not on a beta-blocker or a ACE inhibitor.  General Health Maintenance -Continue daily aspirin. -Encourage 20-30 minutes of daily exercise. -Advise against frequent consumption of fried foods. -Follow-up in 6 months, or sooner if new symptoms develop.   This was a 42-minute office visit encounter in discussion regarding medical management versus cardiac catheterization, primary prevention versus secondary prevention, discussions regarding diet and answering all questions to both patient and her husband.  Signed,  Yates Decamp, MD, [facility] 04/11/2023, 7:26 AM [facility] [address] [place], [place] [address] Phone: [phone]. Fax:  [phone]

## 2023-04-11 ENCOUNTER — Encounter: Payer: Self-pay | Admitting: Cardiology

## 2023-04-11 LAB — LIPOPROTEIN A (LPA): Lipoprotein (a): 68.8 nmol/L (ref <75.0)

## 2023-04-11 LAB — LIPID PANEL WITH LDL/HDL RATIO
Cholesterol, Total: 162 mg/dL (ref 100–199)
HDL: 48 mg/dL (ref >39)
LDL Chol Calc (NIH): 87 mg/dL (ref 0–99)
LDL/HDL Ratio: 1.8 {ratio} (ref 0.0–3.2)
Triglycerides: 159 mg/dL — ABNORMAL HIGH (ref 0–149)
VLDL Cholesterol Cal: 27 mg/dL (ref 5–40)

## 2023-04-11 LAB — ECHOCARDIOGRAM COMPLETE
AR max vel: 1.26 cm2
AV Area VTI: 1.41 cm2
AV Area mean vel: 1.34 cm2
AV Mean grad: 7.6 mm[Hg]
AV Peak grad: 15.8 mm[Hg]
Ao pk vel: 1.99 m/s
Area-P 1/2: 3 cm2
S' Lateral: 2.4 cm

## 2023-04-11 NOTE — Progress Notes (Signed)
04/10/2023: Lp(a) 68.8

## 2023-04-29 ENCOUNTER — Other Ambulatory Visit (HOSPITAL_COMMUNITY): Payer: Medicare Other

## 2023-04-30 ENCOUNTER — Ambulatory Visit (HOSPITAL_COMMUNITY)
Admission: RE | Admit: 2023-04-30 | Discharge: 2023-04-30 | Disposition: A | Discharge status: Home / Self Care | Payer: Medicare Other | Source: Ambulatory Visit | Attending: Cardiology | Admitting: Cardiology

## 2023-04-30 ENCOUNTER — Telehealth: Payer: Self-pay | Admitting: Cardiology

## 2023-04-30 ENCOUNTER — Other Ambulatory Visit (HOSPITAL_COMMUNITY): Payer: Medicare Other

## 2023-04-30 ENCOUNTER — Other Ambulatory Visit (HOSPITAL_BASED_OUTPATIENT_CLINIC_OR_DEPARTMENT_OTHER): Payer: Medicare Other

## 2023-04-30 ENCOUNTER — Ambulatory Visit: Payer: Medicare Other | Admitting: Sports Medicine

## 2023-04-30 ENCOUNTER — Encounter: Payer: Self-pay | Admitting: Sports Medicine

## 2023-04-30 VITALS — BP 120/60 | Ht 64.0 in | Wt 160.0 lb

## 2023-04-30 DIAGNOSIS — M7582 Other shoulder lesions, left shoulder: Secondary | ICD-10-CM

## 2023-04-30 DIAGNOSIS — I700 Atherosclerosis of aorta: Secondary | ICD-10-CM | POA: Insufficient documentation

## 2023-04-30 DIAGNOSIS — L814 Other melanin hyperpigmentation: Secondary | ICD-10-CM | POA: Diagnosis not present

## 2023-04-30 DIAGNOSIS — R9439 Abnormal result of other cardiovascular function study: Secondary | ICD-10-CM | POA: Insufficient documentation

## 2023-04-30 DIAGNOSIS — M654 Radial styloid tenosynovitis [de Quervain]: Secondary | ICD-10-CM

## 2023-04-30 DIAGNOSIS — M25561 Pain in right knee: Secondary | ICD-10-CM | POA: Diagnosis not present

## 2023-04-30 DIAGNOSIS — M25532 Pain in left wrist: Secondary | ICD-10-CM | POA: Diagnosis not present

## 2023-04-30 DIAGNOSIS — E7800 Pure hypercholesterolemia, unspecified: Secondary | ICD-10-CM | POA: Insufficient documentation

## 2023-04-30 MED ORDER — METHYLPREDNISOLONE ACETATE 40 MG/ML IJ SUSP
40.0000 mg | Freq: Once | INTRAMUSCULAR | Status: AC
  Administered 2023-04-30: 40 mg via INTRA_ARTICULAR

## 2023-04-30 NOTE — Telephone Encounter (Signed)
Pt's medication has already been sent to pt's pharmacy as requested. Confirmation received.  

## 2023-04-30 NOTE — Telephone Encounter (Signed)
*  STAT* If patient is at the pharmacy, call can be transferred to refill team.   1. Which medications need to be refilled? (please list name of each medication and dose if known)   ezetimibe-simvastatin (VYTORIN) 10-40 MG tablet    2. Which pharmacy/location (including street and city if local pharmacy) is medication to be sent to? [id number] - [place],  - [address]   3. Do they need a 30 day or 90 day supply? 90 Pt forgot to request this medication along with the others. Should still have refills

## 2023-04-30 NOTE — Progress Notes (Signed)
   Subjective:    Patient ID: Gina Johns, female    DOB: 1955/12/26, 67 y.o.   MRN: [id number]  HPI  Patient presents today for follow-up on left shoulder and right knee pain.  Left shoulder pain has improved.  Only pain is with reaching behind her such as getting things out of the backseat.  Right knee pain persist.  We have discussed a cortisone injection at her last visit.  She is also describing some intermittent left wrist pain and swelling.  This has been ongoing for a while.  Pain will occasionally radiate into the thumb.  Review of Systems As above    Objective:   Physical Exam  Well-developed, well-nourished.  No acute distress  Left shoulder: Full painless shoulder range of motion.  No tenderness to palpation.  Negative empty can.  Rotator cuff strength is 5/5 and does not reproduce pain.  Right knee: Full range of motion.  No effusion.  Negative McMurray's.  Neurovascularly intact distally.  Left wrist: Full range of motion.  Mild amount of swelling in the first extensor compartment.  Negative CMC grind.  Mildly positive Finkelstein.  Neurovascularly intact distally.     Assessment & Plan:   Improved left shoulder pain Persistent right knee pain likely secondary to mild DJD Intermittent left wrist pain secondary to de Quervain's tenosynovitis  Right knee is injected with cortisone today.  An anterior lateral approach is utilized.  She tolerates this without difficulty.  We will provide her with a thumb spica brace to wear as needed for her de Quervain's tenosynovitis.  I have also recommended topical treatments such as ice, Aspercreme, or Voltaren.  Follow-up as needed.  Consent obtained and verified. Time-out conducted. Noted no overlying erythema, induration, or other signs of local infection. Skin prepped in a sterile fashion. Topical analgesic spray: Ethyl chloride. Joint: Right knee Needle: 25-gauge 1.5 inch Completed without difficulty. Meds: 3 cc 1%  Xylocaine, 1 cc (40 mg) Depo-Medrol  This note was dictated using Dragon naturally speaking software and may contain errors in syntax, spelling, or content which have not been identified prior to signing this note.

## 2023-04-30 NOTE — Telephone Encounter (Signed)
*  STAT* If patient is at the pharmacy, call can be transferred to refill team.   1. Which medications need to be refilled? (please list name of each medication and dose if known)   aspirin (ASPIRIN CHILDRENS) 81 MG chewable tablet    Psyllium (METAMUCIL 4 IN 1 FIBER) 55.6 % POWD    cholecalciferol (VITAMIN D) 1000 units tablet    2. Which pharmacy/location (including street and city if local pharmacy) is medication to be sent to?  [id number] - [place], [place] - [address]      3. Do they need a 30 day or 90 day supply? 90 day    Pt is out of medication

## 2023-04-30 NOTE — Telephone Encounter (Signed)
Pt's medications are at pt's pharmacy as requested. Confirmation received.

## 2023-05-07 NOTE — Progress Notes (Signed)
Coronary calcium score 05/01/2023 Total Agatston coronary calcium score 0. MESA database percentile 0-1. Visualized ascending and descending aorta Normal in size.

## 2023-05-20 ENCOUNTER — Encounter: Payer: Self-pay | Admitting: Cardiology

## 2023-05-22 DIAGNOSIS — J069 Acute upper respiratory infection, unspecified: Secondary | ICD-10-CM | POA: Diagnosis not present

## 2023-05-26 NOTE — Progress Notes (Signed)
Her coronary calcium score is 0 which is excellent and suggests less likelihood of significant CAD,  She has small hiatal hernia and this can cause acid reflux and chest pain and shortness of breath. She can try OTC Omeprazole 20 mg before breakfast for 2 weeks and then as needed for symptoms

## 2023-05-30 ENCOUNTER — Telehealth: Payer: Self-pay | Admitting: Cardiology

## 2023-05-30 MED ORDER — OMEPRAZOLE 20 MG PO CPDR: DELAYED_RELEASE_CAPSULE | ORAL | Status: AC

## 2023-05-30 NOTE — Telephone Encounter (Signed)
Patient was returning call for results. Please advise °

## 2023-05-30 NOTE — Telephone Encounter (Signed)
-----   Message from Yates Decamp sent at 05/26/2023  8:32 AM EST ----- Her coronary calcium score is 0 which is excellent and suggests less likelihood of significant CAD,  She has small hiatal hernia and this can cause acid reflux and chest pain and shortness of breath. She can try OTC Omeprazole 20 mg before breakfast for 2 weeks and then as needed for symptoms

## 2023-05-30 NOTE — Telephone Encounter (Signed)
I spoke with patient and reviewed results with her.

## 2023-07-03 DIAGNOSIS — N958 Other specified menopausal and perimenopausal disorders: Secondary | ICD-10-CM | POA: Diagnosis not present

## 2023-07-03 DIAGNOSIS — E2839 Other primary ovarian failure: Secondary | ICD-10-CM | POA: Diagnosis not present

## 2023-08-12 DIAGNOSIS — H40013 Open angle with borderline findings, low risk, bilateral: Secondary | ICD-10-CM | POA: Diagnosis not present

## 2023-08-12 DIAGNOSIS — H04123 Dry eye syndrome of bilateral lacrimal glands: Secondary | ICD-10-CM | POA: Diagnosis not present

## 2023-09-02 DIAGNOSIS — C50411 Malignant neoplasm of upper-outer quadrant of right female breast: Secondary | ICD-10-CM | POA: Diagnosis not present

## 2023-09-03 DIAGNOSIS — C50411 Malignant neoplasm of upper-outer quadrant of right female breast: Secondary | ICD-10-CM | POA: Diagnosis not present

## 2023-09-04 DIAGNOSIS — C50411 Malignant neoplasm of upper-outer quadrant of right female breast: Secondary | ICD-10-CM | POA: Diagnosis not present

## 2023-10-01 DIAGNOSIS — R0981 Nasal congestion: Secondary | ICD-10-CM | POA: Diagnosis not present

## 2023-10-01 DIAGNOSIS — J209 Acute bronchitis, unspecified: Secondary | ICD-10-CM | POA: Diagnosis not present

## 2023-10-01 DIAGNOSIS — J029 Acute pharyngitis, unspecified: Secondary | ICD-10-CM | POA: Diagnosis not present

## 2023-10-08 DIAGNOSIS — Z0000 Encounter for general adult medical examination without abnormal findings: Secondary | ICD-10-CM | POA: Diagnosis not present

## 2023-10-08 DIAGNOSIS — Z1389 Encounter for screening for other disorder: Secondary | ICD-10-CM | POA: Diagnosis not present

## 2023-10-11 ENCOUNTER — Ambulatory Visit
Admission: RE | Admit: 2023-10-11 | Discharge: 2023-10-11 | Disposition: A | Discharge status: Home / Self Care | Source: Ambulatory Visit | Attending: Oncology | Admitting: Oncology

## 2023-10-11 ENCOUNTER — Other Ambulatory Visit: Payer: Self-pay | Admitting: Oncology

## 2023-10-11 DIAGNOSIS — Z1231 Encounter for screening mammogram for malignant neoplasm of breast: Secondary | ICD-10-CM | POA: Diagnosis not present

## 2023-10-17 DIAGNOSIS — E785 Hyperlipidemia, unspecified: Secondary | ICD-10-CM | POA: Diagnosis not present

## 2023-10-17 DIAGNOSIS — M8580 Other specified disorders of bone density and structure, unspecified site: Secondary | ICD-10-CM | POA: Diagnosis not present

## 2023-10-21 DIAGNOSIS — C50411 Malignant neoplasm of upper-outer quadrant of right female breast: Secondary | ICD-10-CM | POA: Diagnosis not present

## 2023-10-21 DIAGNOSIS — I890 Lymphedema, not elsewhere classified: Secondary | ICD-10-CM | POA: Diagnosis not present

## 2023-10-21 DIAGNOSIS — Z170 Estrogen receptor positive status [ER+]: Secondary | ICD-10-CM | POA: Diagnosis not present

## 2023-10-29 DIAGNOSIS — Z1339 Encounter for screening examination for other mental health and behavioral disorders: Secondary | ICD-10-CM | POA: Diagnosis not present

## 2023-10-29 DIAGNOSIS — C50911 Malignant neoplasm of unspecified site of right female breast: Secondary | ICD-10-CM | POA: Diagnosis not present

## 2023-10-29 DIAGNOSIS — J45909 Unspecified asthma, uncomplicated: Secondary | ICD-10-CM | POA: Diagnosis not present

## 2023-10-29 DIAGNOSIS — Z23 Encounter for immunization: Secondary | ICD-10-CM | POA: Diagnosis not present

## 2023-10-29 DIAGNOSIS — Z0000 Encounter for general adult medical examination without abnormal findings: Secondary | ICD-10-CM | POA: Diagnosis not present

## 2023-10-29 DIAGNOSIS — Z1331 Encounter for screening for depression: Secondary | ICD-10-CM | POA: Diagnosis not present

## 2023-11-08 DIAGNOSIS — L299 Pruritus, unspecified: Secondary | ICD-10-CM | POA: Diagnosis not present

## 2023-11-12 ENCOUNTER — Ambulatory Visit: Attending: Internal Medicine

## 2023-11-12 ENCOUNTER — Other Ambulatory Visit: Payer: Self-pay

## 2023-11-12 DIAGNOSIS — Z1211 Encounter for screening for malignant neoplasm of colon: Secondary | ICD-10-CM | POA: Diagnosis not present

## 2023-11-12 DIAGNOSIS — I890 Lymphedema, not elsewhere classified: Secondary | ICD-10-CM | POA: Insufficient documentation

## 2023-11-12 NOTE — Therapy (Signed)
 OUTPATIENT PHYSICAL THERAPY  UPPER EXTREMITY ONCOLOGY EVALUATION  Patient Name: Gina Johns MRN: [id number] DOB:05/24/1956, 67 y.o., female Today's Date: 11/12/2023  END OF SESSION:  PT End of Session - 11/12/23 1727     Visit Number 1    Number of Visits 6    Date for PT Re-Evaluation 12/03/23    Authorization Type BCBS    PT Start Time 1603    PT Stop Time 1650    PT Time Calculation (min) 47 min    Activity Tolerance Patient tolerated treatment well    Behavior During Therapy [facility] for tasks assessed/performed             Past Medical History:  Diagnosis Date   Anxiety    Arthritis    Breast cancer (HCC) 12/07/2011   right breast 2x3 cm   Depression    " sometimes"   Hypercholesterolemia    PMH   Right knee pain 08/26/2012   Past Surgical History:  Procedure Laterality Date   BREAST LUMPECTOMY WITH AXILLARY LYMPH NODE BIOPSY     CESAREAN SECTION     Hx: of times 2   MASS EXCISION Right 02/25/2014   Procedure: EXCISION MASS RIGHT CHEST WALL;  Surgeon: Lillette Reid III, MD;  Location: [facility] OR;  Service: General;  Laterality: Right;   MASTECTOMY Right    TOTAL MASTECTOMY Right 03/20/2013   Procedure: TOTAL MASTECTOMY;  Surgeon: Mayme Spearman, MD;  Location: [facility] OR;  Service: General;  Laterality: Right;   Patient Active Problem List   Diagnosis Date Noted   Memory loss 06/10/2018   Depression with anxiety 06/10/2018   Low serum vitamin D  06/10/2018   Osteopenia determined by x-ray 04/05/2016   Upper airway cough syndrome 10/13/2015   Osteopenia 02/22/2014   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   Chondromalacia of patella, 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   Lymphedema 10/19/2013   Breast cancer of upper-outer quadrant of right female breast (HCC) 03/06/2013   Dyslipidemia 01/22/2013   Right knee pain 08/26/2012    PCP:   REFERRING PROVIDER: Lonzie Robins MD  REFERRING DIAG: Right UE lymphedema  THERAPY DIAG:  Lymphedema, not elsewhere classified - Plan: PT plan of care  cert/re-cert  ONSET DATE: 4 months ago  Rationale for Evaluation and Treatment: Rehabilitation  SUBJECTIVE:                                                                                                                                                                                           SUBJECTIVE STATEMENT:  She had surgery years ago in 2013, and she wore compression sleeves to prevent lymphedema. About 4 months  ago she was moving furniture and she noticed her right UE got swollen. She has no pain, but occasional sharp pains in various places in her body like her toe and her chest. The sleeves on her dresses are tighter on the right. She is leaving for several months in [place] and wants a new compression sleeve/gauntlet. She has 2 weeks before she leaves and would like to schedule 2 weeks of therapy if possible before she goes.  PERTINENT HISTORY:  November 30, 2011. Post  Right mastectomy with 0+/15 LN's in [place]. Has worn compression sleeves in the past for prevention. Had radiation in [place] as well and anti estrogens  PAIN:  Are you having pain? No  PRECAUTIONS: Right UE lymphedema  RED FLAGS: None   WEIGHT BEARING RESTRICTIONS: No  FALLS:  Has patient fallen in last 6 months? 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  COGNITION: Overall cognitive status: Within functional limits for tasks assessed   PALPATION: No significant pitting, no tenderness  OBSERVATIONS / OTHER ASSESSMENTS: Right UE visibly larger at elbow and above  SENSATION: Light touch: Deficits     POSTURE: forward head, rounded shoulders  UPPER EXTREMITY  AROM/PROM:  A/PROM RIGHT   eval   Shoulder extension   Shoulder flexion wNL  Shoulder abduction WNL  Shoulder internal rotation   Shoulder external rotation     (Blank rows = not tested)  A/PROM LEFT   eval  Shoulder extension   Shoulder flexion WNL  Shoulder abduction WNL  Shoulder internal rotation   Shoulder external rotation     (Blank rows = not tested)  CERVICAL AROM: All within functional limits:   UPPER EXTREMITY STRENGTH:   LYMPHEDEMA ASSESSMENTS:   SURGERY TYPE/DATE: November 30, 2011. Post  Right mastectomy  NUMBER OF LYMPH NODES REMOVED: 0+/15  CHEMOTHERAPY: NO  RADIATION:YES  HORMONE TREATMENT: Tamoxifen  INFECTIONS: NO   LYMPHEDEMA ASSESSMENTS:   LANDMARK RIGHT  eval  At axilla  32.4  15 cm proximal to olecranon process 28.7  10 cm proximal to olecranon process 27.5  Olecranon process 24.6  15 cm proximal to ulnar styloid process 22.8  10 cm proximal to ulnar styloid process 20.1  Just proximal to ulnar styloid process 15.4  Across hand at thumb web space 19.7  At base of 2nd digit 5.9  (Blank rows = not tested)  LANDMARK LEFT  eval  At axilla  30.5  15 cm proximal to olecranon process 28  10 cm proximal to olecranon process 25.3  Olecranon process 23.4  15 cm proximal to ulnar styloid process 22.1  10 cm proximal to ulnar styloid process 19.5  Just proximal to ulnar styloid process 15.6  Across hand at thumb web space 18.7  At base of 2nd digit 5.85  (Blank rows = not tested)   FUNCTIONAL TESTS:    GAIT: WNL   QUICK DASH SURVEY: 29%  TREATMENT DATE:  11/12/2023 Discussed with pt. Options for treatment including compression bandaging, MLD, exercises and then getting new garments. Pt is leaving for [place] in 2weeks. Discussed possibility of getting garments at [facility] and gave her a prescription filled  out for her to give to Dr. Thor Fling.  I will also fax it to him. Showed compression guru site. She prefers a single piece garment with sleeve and gauntlet attached but I explained she does not have time to get a custom garment before her trip in 2 weeks.  She was only able to get 2-3 appts due to the holiday and requires 4:00 appts so her time for treatment is limited. She may require help ordering on [employer] [facility] if [facility] does not take her insurance.    PATIENT EDUCATION:  Education details: see above Person educated: Patient Education method: Explanation Education comprehension: verbalized understanding  HOME EXERCISE PROGRAM: None given  ASSESSMENT:  CLINICAL IMPRESSION: Patient is a 68 y.o. female who was seen today for physical therapy evaluation and treatment for Right UE lymphedema. She had her surgery in 2013 with 15 Negative LN's removed. She has been wearing compression sleeves for a number of years for prevention. About 4 months ago after helping her husband move furniture she developed swelling in her right UE. She is leaving in 2 weeks for [place] to stay for several months. If we can get her in she would like to come for some therapy to reduce her arm before she leaves. She was also given a script to have Dr. Thor Fling sign so she may purchase a compression sleeve/gauntlet at [facility] for her travel. She would benefit from skilled PT to address swelling to reduce her arm and to be sure she has appropriate compression.   OBJECTIVE IMPAIRMENTS: decreased knowledge of condition, increased edema, and postural dysfunction.   ACTIVITY LIMITATIONS: carrying and lifting  PARTICIPATION LIMITATIONS: cleaning and heavier activities  PERSONAL FACTORS: 1-2 comorbidities: Right mastectomy s/p radiation, 15 LN's removed are also affecting patient's functional outcome.   REHAB POTENTIAL: Good  CLINICAL DECISION MAKING: Stable/uncomplicated  EVALUATION COMPLEXITY:  Low  GOALS: Goals reviewed with patient? Yes  SHORT TERM GOALS= LONG TERM GOALS: Target date: 12/03/2023  Pt will reduce right UE prior to travelling to [place] in preparation for new garments Baseline: Goal status: INITIAL  2.  Pt will have appropriate compression garments for travel and care of lymphedema Baseline:  Goal status: INITIAL  3.  Pt will be tolerant of compression bandaging Baseline:  Goal status: INITIAL  PLAN:  PT FREQUENCY: 3x/week  PT DURATION: 3 weeks  PLANNED INTERVENTIONS: 97164- PT Re-evaluation, 97110-Therapeutic exercises, 97530- Therapeutic activity, 97112- Neuromuscular re-education, 97535- Self Care, 09811- Manual therapy, [phone]- Orthotic Initial, and Patient/Family education  PLAN FOR NEXT SESSION: MLD, compression bandaging, instruct pt/husband if possible, gave script for pt to go to [facility]. She leaves for [place] on June 8 for several months faxed script to Dr. Gita Lamb, PT 11/12/2023, 5:51 PM

## 2023-11-20 ENCOUNTER — Ambulatory Visit

## 2023-11-20 DIAGNOSIS — I890 Lymphedema, not elsewhere classified: Secondary | ICD-10-CM | POA: Diagnosis not present

## 2023-11-20 NOTE — Patient Instructions (Addendum)
PLEASE KEEP YOUR BANDAGES ON AS LONG AS POSSIBLE TO GET THE BEST SWELLING REDUCTION. Should your bandages become uncomfortable or feel too tight, follow these steps: Elevate your extremity higher than your heart.  Try to move your arm or leg joints against the firmness of the bandage to help with moving the fluid and allow the bandages to loosen a bit.  If the bandaging is still is too tight, it is ok to carefully remove the top layer.  There will still be more layers under it that can provide compression to your extremity. Finally, if you STILL have significant pain after trying these steps, it is ok to take the bandage off.  Check your skin carefully for any signs of irritation  PLEASE bring ALL bandage materials back to your next appointment as we will reuse what we can TAKE CARE OF YOUR BANDAGES SO THEY WILL LAST LONGER AND STAY IN BETTER CONDITION Washing bandages:  Wash periodically using a mild detergent in warm water.  Do not use fabric softener or bleach.  Place bandages in a mesh lingerie bag or in a tied off pillow case and use the gentle cycle of the washing machine or hand wash. If you hand wash, you may want to put them in the spin cycle of your washer to get the extra water out, but make sure you put them in a mesh bag first. Do not wring or stretch them while they are wet.  Drying bandages: Lay the bandages out smoothly on a towel away from direct sunlight or heating sources that can damage the fabric. Rolling bandages in a towel and gently squeezing the towel to remove excess water before laying them out can speed up the process.  If you use a drying rack, place a towel on top of the rack to lay the bandages on.  If they hang down to dry, they fabric could be stretched out and the bandage will lose its compression.   Or, keep bandages in the mesh bag and dry them in the dryer on the low or no heat cycle. Rolling bandages: Please roll your bandages after drying them so they are ready for  your next treatment. If they are rolled too loose, they will be difficult to apply.  If rolled too tight, they can get stretched out.   TAKE CARE OF YOUR SKIN Apply a low pH moisturizing lotion to your skin daily Avoid scratching your skin Treat skin irritations quickly  Know the 5 warning signs of infection: redness, pain, warmth to touch, fever and increased swelling.  Call your physician immediately if you notice any of these signs of a possible infection.   Self bandaging the arm:   Follow along with this video:  Https://www.youtube.com/watch?v=[email or web address]  -OR-  Search in your internet browser: "self bandaging arm MD Anderson"   And the video should appear in the video search results "Lymphedema Management: Self bandaging your arm" on YouTube   This video is for caregiver assistance:  https://www.youtube.com/watch?v=[email or web address]-JVdRIs  -OR-  Enter " Lymphedema Management: Caregiver Bandaging you arm" into your search browser and your video should appear in the results   

## 2023-11-20 NOTE — Therapy (Signed)
 OUTPATIENT PHYSICAL THERAPY  UPPER EXTREMITY ONCOLOGY TREATMENT  Patient Name: Gina Johns MRN: [id number] DOB:03-25-1956, 68 y.o., female Today's Date: 11/20/2023  END OF SESSION:  PT End of Session - 11/20/23 1607     Visit Number 2    Number of Visits 6    Date for PT Re-Evaluation 12/03/23    Authorization Type BCBS Medicare    PT Start Time 1607   late   PT Stop Time 1708    PT Time Calculation (min) 61 min    Activity Tolerance Patient tolerated treatment well    Behavior During Therapy WFL for tasks assessed/performed             Past Medical History:  Diagnosis Date   Anxiety    Arthritis    Breast cancer (HCC) 12/07/2011   right breast 2x3 cm   Depression    " sometimes"   Hypercholesterolemia    PMH   Right knee pain 08/26/2012   Past Surgical History:  Procedure Laterality Date   BREAST LUMPECTOMY WITH AXILLARY LYMPH NODE BIOPSY     CESAREAN SECTION     Hx: of times 2   MASS EXCISION Right 02/25/2014   Procedure: EXCISION MASS RIGHT CHEST WALL;  Surgeon: Lillette Reid III, MD;  Location: [facility] OR;  Service: General;  Laterality: Right;   MASTECTOMY Right    TOTAL MASTECTOMY Right 03/20/2013   Procedure: TOTAL MASTECTOMY;  Surgeon: Mayme Spearman, MD;  Location: [facility];  Service: General;  Laterality: Right;   Patient Active Problem List   Diagnosis Date Noted   Memory loss 06/10/2018   Depression with anxiety 06/10/2018   Low serum vitamin D  06/10/2018   Osteopenia determined by x-ray 04/05/2016   Upper airway cough syndrome 10/13/2015   Osteopenia 02/22/2014   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   Chondromalacia of patella, 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   Lymphedema 10/19/2013   Breast cancer of upper-outer quadrant of right female breast (HCC) 03/06/2013   Dyslipidemia 01/22/2013   Right knee pain 08/26/2012    PCP:   REFERRING PROVIDER: Lonzie Robins MD  REFERRING DIAG: Right UE lymphedema  THERAPY DIAG:  Lymphedema, not elsewhere classified  ONSET DATE: 4  months ago  Rationale for Evaluation and Treatment: Rehabilitation  SUBJECTIVE:                                                                                                                                                                                           SUBJECTIVE STATEMENT:  Pt. has not yet been to [facility] to get her sleeve, but she may have a new one at home  that she can use. She will bring tomorrow.  EVAL She had surgery years ago in 2013, and she wore compression sleeves to prevent lymphedema. About 4 months ago she was moving furniture and she noticed her right UE got swollen. She has no pain, but occasional sharp pains in various places in her body like her toe and her chest. The sleeves on her dresses are tighter on the right. She is leaving for several months in [place] and wants a new compression sleeve/gauntlet. She has 2 weeks before she leaves and would like to schedule 2 weeks of therapy if possible before she goes.  PERTINENT HISTORY:  November 30, 2011. Post  Right mastectomy with 0+/15 LN's in [place]. Has worn co mpression sleeves in the past for prevention. Had radiation in [place] as well and anti estrogens  PAIN:  Are you having pain? No  PRECAUTIONS: Right UE lymphedema  RED FLAGS: None   WEIGHT BEARING RESTRICTIONS: No  FALLS:  Has patient fallen in last 6 months? 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  COGNITION: Overall cognitive status: Within functional limits for tasks assessed   PALPATION: No significant pitting, no tenderness  OBSERVATIONS / OTHER ASSESSMENTS: Right UE visibly larger at elbow and  above  SENSATION: Light touch: Deficits     POSTURE: forward head, rounded shoulders  UPPER EXTREMITY AROM/PROM:  A/PROM RIGHT   eval   Shoulder extension   Shoulder flexion wNL  Shoulder abduction WNL  Shoulder internal rotation   Shoulder external rotation     (Blank rows = not tested)  A/PROM LEFT   eval  Shoulder extension   Shoulder flexion WNL  Shoulder abduction WNL  Shoulder internal rotation   Shoulder external rotation     (Blank rows = not tested)  CERVICAL AROM: All within functional limits:   UPPER EXTREMITY STRENGTH:   LYMPHEDEMA ASSESSMENTS:   SURGERY TYPE/DATE: November 30, 2011. Post  Right mastectomy  NUMBER OF LYMPH NODES REMOVED: 0+/15  CHEMOTHERAPY: NO  RADIATION:YES  HORMONE TREATMENT: Tamoxifen  INFECTIONS: NO   LYMPHEDEMA ASSESSMENTS:   LANDMARK RIGHT  eval  At axilla  32.4  15 cm proximal to olecranon process 28.7  10 cm proximal to olecranon process 27.5  Olecranon process 24.6  15 cm proximal to ulnar styloid process 22.8  10 cm proximal to ulnar styloid process 20.1  Just proximal to ulnar styloid process 15.4  Across hand at thumb web space 19.7  At base of 2nd digit 5.9  (Blank rows = not tested)  LANDMARK LEFT  eval  At axilla  30.5  15 cm proximal to olecranon process 28  10 cm proximal to olecranon process 25.3  Olecranon process 23.4  15 cm proximal to ulnar styloid process 22.1  10 cm proximal to ulnar styloid process 19.5  Just proximal to ulnar styloid process 15.6  Across hand at thumb web space 18.7  At base of 2nd digit 5.85  (Blank rows = not tested)   FUNCTIONAL TESTS:    GAIT: WNL   QUICK DASH SURVEY: 29%  TREATMENT DATE:   11/20/2023 Initiated MLD to pts right UE and educated in lymphatic system; superficial nature, pathways etc while performing. In supine: Short neck,  5 diaphragmatic breaths, L axillary nodes and establishment of interaxillary pathway, R inguinal nodes and establishment of axilloinguinal pathway, then R UE working proximal to distal, moving inner upper arm outwards and upwards, and doing both sides of forearms, spending extra time in any areas of fibrosis then retracing all steps. COMPRESSION BANDAGING: applied lotion to pts right arm. Applied TG soft, artiflex hand to axilla, 6 cm wrap hand and wrist, 8 cm wrap wrist to upper arm with "x" at elbow, and 8 cm wrap lower forearm to axilla. Pulled TG soft down over the top. Instructed pt in remedial exercises and reviewed capillary refill, exercises to loosen wrap, when to remove wraps if pain, numbness etc, laundering instructions. Discussed difference between wraps and sleeve in terms of reduction/ maintaining. Pt is going to try and have her husband come tomorrow afternoon. They leave for [place] in 7 Days.   11/12/2023 Discussed with pt. Options for treatment including compression bandaging, MLD, exercises and then getting new garments. Pt is leaving for [place] in 2weeks. Discussed possibility of getting garments at [facility] and gave her a prescription filled out for her to give to Dr. Thor Fling.  I will also fax it to him. Showed compression guru site. She prefers a single piece garment with sleeve and gauntlet attached but I explained she does not have time to get a custom garment before her trip in 2 weeks.  She was only able to get 2-3 appts due to the holiday and requires 4:00 appts so her time for treatment is limited. She may require help ordering on [employer] [facility] if [facility] does not take her insurance.    PATIENT EDUCATION:  Education details: see above Person educated: Patient Education method: Explanation Education comprehension: verbalized understanding  HOME EXERCISE PROGRAM: None given  ASSESSMENT:  CLINICAL IMPRESSION:  Initiated MLD and compression bandaging to pts  right UE while verbally instructing pt in each. She had good tolerance for wraps in clinics. She was reminded of importance of removing if having pain, numbness, tingling or if fingers swell.  OBJECTIVE IMPAIRMENTS: decreased knowledge of condition, increased edema, and postural dysfunction.   ACTIVITY LIMITATIONS: carrying and lifting  PARTICIPATION LIMITATIONS: cleaning and heavier activities  PERSONAL FACTORS: 1-2 comorbidities: Right mastectomy s/p radiation, 15 LN's removed are also affecting patient's functional outcome.   REHAB POTENTIAL: Good  CLINICAL DECISION MAKING: Stable/uncomplicated  EVALUATION COMPLEXITY: Low  GOALS: Goals reviewed with patient? Yes  SHORT TERM GOALS= LONG TERM GOALS: Target date: 12/03/2023  Pt will reduce right UE prior to travelling to [place] in preparation for new garments Baseline: Goal status: INITIAL  2.  Pt will have appropriate compression garments for travel and care of lymphedema Baseline:  Goal status: INITIAL  3.  Pt will be tolerant of compression bandaging Baseline:  Goal status: INITIAL  PLAN:  PT FREQUENCY: 3x/week  PT DURATION: 3 weeks  PLANNED INTERVENTIONS: 97164- PT Re-evaluation, 97110-Therapeutic exercises, 97530- Therapeutic activity, 97112- Neuromuscular re-education, 97535- Self Care, 78295- Manual therapy, [phone]- Orthotic Initial, and Patient/Family education  PLAN FOR NEXT SESSION: MLD, compression bandaging, instruct pt/husband if possible, gave script for pt to go to [facility]. She leaves for [place] on June 8 for several months faxed script to Dr. Gita Lamb, PT 11/20/2023, 5:22 PM

## 2023-11-21 ENCOUNTER — Ambulatory Visit

## 2023-11-21 DIAGNOSIS — I890 Lymphedema, not elsewhere classified: Secondary | ICD-10-CM | POA: Diagnosis not present

## 2023-11-21 NOTE — Therapy (Signed)
 OUTPATIENT PHYSICAL THERAPY  UPPER EXTREMITY ONCOLOGY TREATMENT  Patient Name: Gina Johns MRN: [id number] DOB:January 14, 1956, 68 y.o., female Today's Date: 11/21/2023  END OF SESSION:  PT End of Session - 11/21/23 1610     Visit Number 3    Number of Visits 6    Date for PT Re-Evaluation 12/03/23    Authorization Type BCBS Medicare    PT Start Time 1610    PT Stop Time 1715    PT Time Calculation (min) 65 min    Activity Tolerance Patient tolerated treatment well    Behavior During Therapy WFL for tasks assessed/performed             Past Medical History:  Diagnosis Date   Anxiety    Arthritis    Breast cancer (HCC) 12/07/2011   right breast 2x3 cm   Depression    " sometimes"   Hypercholesterolemia    PMH   Right knee pain 08/26/2012   Past Surgical History:  Procedure Laterality Date   BREAST LUMPECTOMY WITH AXILLARY LYMPH NODE BIOPSY     CESAREAN SECTION     Hx: of times 2   MASS EXCISION Right 02/25/2014   Procedure: EXCISION MASS RIGHT CHEST WALL;  Surgeon: Lillette Reid III, MD;  Location: [facility] OR;  Service: General;  Laterality: Right;   MASTECTOMY Right    TOTAL MASTECTOMY Right 03/20/2013   Procedure: TOTAL MASTECTOMY;  Surgeon: Mayme Spearman, MD;  Location: [facility] OR;  Service: General;  Laterality: Right;   Patient Active Problem List   Diagnosis Date Noted   Memory loss 06/10/2018   Depression with anxiety 06/10/2018   Low serum vitamin D  06/10/2018   Osteopenia determined by x-ray 04/05/2016   Upper airway cough syndrome 10/13/2015   Osteopenia 02/22/2014   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   Chondromalacia of patella, 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   Lymphedema 10/19/2013   Breast cancer of upper-outer quadrant of right female breast (HCC) 03/06/2013   Dyslipidemia 01/22/2013   Right knee pain 08/26/2012    PCP:   REFERRING PROVIDER: Lonzie Robins MD  REFERRING DIAG: Right UE lymphedema  THERAPY DIAG:  Lymphedema, not elsewhere classified  ONSET DATE: 4  months ago  Rationale for Evaluation and Treatment: Rehabilitation  SUBJECTIVE:                                                                                                                                                                                           SUBJECTIVE STATEMENT:  .The wrap felt OK for me and I slept OK with it. I didn't go to [facility] today.  EVAL She had  surgery years ago in 2013, and she wore compression sleeves to prevent lymphedema. About 4 months ago she was moving furniture and she noticed her right UE got swollen. She has no pain, but occasional sharp pains in various places in her body like her toe and her chest. The sleeves on her dresses are tighter on the right. She is leaving for several months in [place] and wants a new compression sleeve/gauntlet. She has 2 weeks before she leaves and would like to schedule 2 weeks of therapy if possible before she goes.  PERTINENT HISTORY:  November 30, 2011. Right lumpectomy in [place]. Post  Right mastectomy 03/20/2013 with Dr. Alethea Andes .  Has worn co mpression sleeves in the past for prevention. Had radiation in [place] as well and anti estrogens  PAIN:  Are you having pain? No  PRECAUTIONS: Right UE lymphedema  RED FLAGS: None   WEIGHT BEARING RESTRICTIONS: No  FALLS:  Has patient fallen in last 6 months? 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  COGNITION: Overall cognitive status: Within functional limits for tasks assessed   PALPATION: No significant pitting, no tenderness  OBSERVATIONS / OTHER ASSESSMENTS: Right UE visibly larger at elbow and above  SENSATION: Light  touch: Deficits     POSTURE: forward head, rounded shoulders  UPPER EXTREMITY AROM/PROM:  A/PROM RIGHT   eval   Shoulder extension   Shoulder flexion wNL  Shoulder abduction WNL  Shoulder internal rotation   Shoulder external rotation     (Blank rows = not tested)  A/PROM LEFT   eval  Shoulder extension   Shoulder flexion WNL  Shoulder abduction WNL  Shoulder internal rotation   Shoulder external rotation     (Blank rows = not tested)  CERVICAL AROM: All within functional limits:   UPPER EXTREMITY STRENGTH:   LYMPHEDEMA ASSESSMENTS:   SURGERY TYPE/DATE: November 30, 2011. Right lumpectomy in [place], Post  Right mastectomy 03/20/2013 with Dr. Alethea Andes  NUMBER OF LYMPH NODES REMOVED: 0+/15  CHEMOTHERAPY: NO  RADIATION:YES  HORMONE TREATMENT: Tamoxifen  INFECTIONS: NO   LYMPHEDEMA ASSESSMENTS:   LANDMARK RIGHT  eval RIGHT 11/21/2023  At axilla  32.4 31.4  15 cm proximal to olecranon process 28.7   10 cm proximal to olecranon process 27.5 27.1  Olecranon process 24.6 24.4  15 cm proximal to ulnar styloid process 22.8   10 cm proximal to ulnar styloid process 20.1   Just proximal to ulnar styloid process 15.4   Across hand at thumb web space 19.7   At base of 2nd digit 5.9   (Blank rows = not tested)  LANDMARK LEFT  eval  At axilla  30.5  15 cm proximal to olecranon process 28  10 cm proximal to olecranon process 25.3  Olecranon process 23.4  15 cm proximal to ulnar styloid process 22.1  10 cm proximal to ulnar styloid process 19.5  Just proximal to ulnar styloid process 15.6  Across hand at thumb web space 18.7  At base of 2nd digit 5.85  (Blank rows = not tested)   FUNCTIONAL TESTS:    GAIT: WNL   QUICK DASH SURVEY: 29%  TREATMENT DATE:   11/21/2023 Pt arrived with her husband Loris Ros so he could learn bandaging Removed wraps   and measured pt in several places .rolled bandages and pt washed her arm. Applied cocoa butter and TG soft. Therapist applied artiflex. Instructed pts husband in compression bandaging to fingers and he performed x 1, hand wrap, performed x 1, and 1st UE wrap with (X) x 1, 2nd wrap x 2 due to not getting first one high enough. Therapist demonstrated first and then Shiva redid. Occasional VC required but did very well overall. Pt also brought several new sleeves that she had at home; Elvarex class 1 with separate gauntlet. Will be appropriate for her to wear while in [place]. Advised that when wrap is removed she may have Shiva rewrap and leave on a day or 2 as long as it is comfortable. Also showed him how to check capillary refill. May wrap for plane or wear Elvarex. 11/20/2023 Initiated MLD to pts right UE and educated in lymphatic system; superficial nature, pathways etc while performing. In supine: Short neck, 5 diaphragmatic breaths, L axillary nodes and establishment of interaxillary pathway, R inguinal nodes and establishment of axilloinguinal pathway, then R UE working proximal to distal, moving inner upper arm outwards and upwards, and doing both sides of forearms, spending extra time in any areas of fibrosis then retracing all steps. COMPRESSION BANDAGING: applied lotion to pts right arm. Applied TG soft, artiflex hand to axilla, 6 cm wrap hand and wrist, 8 cm wrap wrist to upper arm with "x" at elbow, and 8 cm wrap lower forearm to axilla. Pulled TG soft down over the top. Instructed pt in remedial exercises and reviewed capillary refill, exercises to loosen wrap, when to remove wraps if pain, numbness etc, laundering instructions. Discussed difference between wraps and sleeve in terms of reduction/ maintaining. Pt is going to try and have her husband come tomorrow afternoon. They leave for [place] in 7 Days.   11/12/2023 Discussed with pt. Options for treatment including compression bandaging,  MLD, exercises and then getting new garments. Pt is leaving for [place] in 2weeks. Discussed possibility of getting garments at [facility] and gave her a prescription filled out for her to give to Dr. Thor Fling.  I will also fax it to him. Showed compression guru site. She prefers a single piece garment with sleeve and gauntlet attached but I explained she does not have time to get a custom garment before her trip in 2 weeks.  She was only able to get 2-3 appts due to the holiday and requires 4:00 appts so her time for treatment is limited. She may require help ordering on [employer] if [facility] does not take her insurance.    PATIENT EDUCATION:  Education details: see above Person educated: Patient Education method: Explanation Education comprehension: verbalized understanding  HOME EXERCISE PROGRAM: None given  ASSESSMENT:  CLINICAL IMPRESSION:  Pts husband did very well with compression bandaging, and pt has a very good idea of how to help him if needed. !.0 Cm reduction noted at axilla today after 24 hours in wrap. Elbow still appears swollen, and several fingers with very mild swelling so showed her husband how to wrap fingers  today. She will return with bandages next week. Leaves for [place] June 8.   OBJECTIVE IMPAIRMENTS: decreased knowledge of condition, increased edema, and postural dysfunction.   ACTIVITY LIMITATIONS: carrying and lifting  PARTICIPATION LIMITATIONS: cleaning and heavier activities  PERSONAL FACTORS: 1-2 comorbidities: Right mastectomy s/p  radiation, 15 LN's removed are also affecting patient's functional outcome.   REHAB POTENTIAL: Good  CLINICAL DECISION MAKING: Stable/uncomplicated  EVALUATION COMPLEXITY: Low  GOALS: Goals reviewed with patient? Yes  SHORT TERM GOALS= LONG TERM GOALS: Target date: 12/03/2023  Pt will reduce right UE prior to travelling to [place] in preparation for new garments Baseline: Goal status: INITIAL  2.  Pt will  have appropriate compression garments for travel and care of lymphedema Baseline:  Goal status: MET Has new [employer] Elvarex garments that she can wear that she had at home  3.  Pt will be tolerant of compression bandaging Baseline:  Goal status:Met 11/21/2023 PLAN:  PT FREQUENCY: 3x/week  PT DURATION: 3 weeks  PLANNED INTERVENTIONS: 97164- PT Re-evaluation, 97110-Therapeutic exercises, 97530- Therapeutic activity, 97112- Neuromuscular re-education, 97535- Self Care, 62130- Manual therapy, [phone]- Orthotic Initial, and Patient/Family education  PLAN FOR NEXT SESSION: MLD, compression bandaging, instruct pt/husband if possible, gave script for pt to go to [facility] (Does not require new sleeve presently). She leaves for [place] on June 8 for several months faxed script to Dr. Gita Lamb, PT 11/21/2023, 5:40 PM

## 2023-11-26 ENCOUNTER — Ambulatory Visit

## 2023-11-28 ENCOUNTER — Ambulatory Visit: Admitting: Rehabilitation

## 2024-02-12 ENCOUNTER — Ambulatory Visit

## 2024-02-13 DIAGNOSIS — H04123 Dry eye syndrome of bilateral lacrimal glands: Secondary | ICD-10-CM | POA: Diagnosis not present

## 2024-02-13 DIAGNOSIS — H35362 Drusen (degenerative) of macula, left eye: Secondary | ICD-10-CM | POA: Diagnosis not present

## 2024-02-13 DIAGNOSIS — H25813 Combined forms of age-related cataract, bilateral: Secondary | ICD-10-CM | POA: Diagnosis not present

## 2024-02-13 DIAGNOSIS — H40023 Open angle with borderline findings, high risk, bilateral: Secondary | ICD-10-CM | POA: Diagnosis not present

## 2024-04-03 NOTE — Progress Notes (Signed)
 Office Visit Note  Patient: Gina Johns             Date of Birth: 1956-04-21           MRN: [id number]             PCP: Avva, Ravisankar, MD Referring: Janey Santos, MD Visit Date: 04/17/2024 Occupation: SUBSTITUE TEACHER  Subjective:  Pain in multiple joints  History of Present Illness: Gina Johns is a 68 y.o. female of joint pain.  According the patient her symptoms started about 2 years ago.  She states the symptoms initially started with shoulder joint pain.  She states she had more pain in the right shoulder than the left.  She started seeing Dr. Arvell and had cortisone injection to her right shoulder which helped.  She states she still has off-and-on discomfort in her shoulders especially the right shoulder.  She states the right knee joint pain started about 2 years ago and she had difficulty climbing stairs getting up from the chair.  She had been seeing Dr. Arvell.  Dr. Arvell injected her right knee joint about 1 year ago but did not have much relief from that.  She was referred to physical therapy but due to her busy schedule she could not go to physical therapy on a regular basis.  She has not noticed any joint swelling.  She states the pain sometimes radiate down to her shin.  She also has noticed some discomfort over her left thumb.  There is no history of joint swelling.  None of the other joints are painful.  There is no history of oral ulcers, nasal ulcers, malar rash, Raynaud's phenomenon or lymphadenopathy.  She gives history of photosensitivity.  She has had some shortness of breath on exertion in the past which responded to inhalers.  There is no personal or family history of psoriasis.  There is no family history of autoimmune disease.  She is right-handed.  She works part-time as a Youth worker.  She has not been exercising on a regular basis due to right knee joint discomfort.  She is married, gravida 4, para 2, miscarriage 2.  There is no history of  preeclampsia or DVTs.  She does not drink alcohol .  She has never been a smoker.    Activities of Daily Living:  Patient reports morning stiffness for a few minutes.   Patient Denies nocturnal pain.  Difficulty dressing/grooming: Denies Difficulty climbing stairs: Reports Difficulty getting out of chair: Reports Difficulty using hands for taps, buttons, cutlery, and/or writing: Denies  Review of Systems  Constitutional:  Positive for fatigue.  HENT:  Negative for mouth sores and mouth dryness.   Eyes:  Positive for dryness.  Respiratory:  Positive for shortness of breath.   Cardiovascular:  Negative for chest pain and palpitations.  Gastrointestinal:  Positive for constipation. Negative for blood in stool and diarrhea.  Endocrine: Negative for increased urination.  Genitourinary:  Negative for involuntary urination.  Musculoskeletal:  Positive for joint pain, joint pain and morning stiffness. Negative for gait problem, joint swelling, myalgias, muscle weakness, muscle tenderness and myalgias.  Skin:  Positive for sensitivity to sunlight. Negative for color change, rash and hair loss.  Allergic/Immunologic: Positive for susceptible to infections.  Neurological:  Negative for dizziness and headaches.  Hematological:  Negative for swollen glands.  Psychiatric/Behavioral:  Positive for sleep disturbance. Negative for depressed mood. The patient is nervous/anxious.     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   Lymphedema 10/19/2013   Breast cancer of upper-outer quadrant of right female breast (HCC) 03/06/2013   Dyslipidemia 01/22/2013   Right knee pain 08/26/2012    Past  Medical History:  Diagnosis Date   Anxiety    Arthritis    Breast cancer (HCC) 12/07/2011   right breast 2x3 cm   Depression     sometimes   Hypercholesterolemia    PMH   Right knee pain 08/26/2012    Family History  Problem Relation Age of Onset   Hypertension Mother    Cancer Sister 20       acute leukemia   Hypertension Sister    Thyroid  disease Sister    High Cholesterol Sister    Past Surgical History:  Procedure Laterality Date   BREAST LUMPECTOMY WITH AXILLARY LYMPH NODE BIOPSY     CESAREAN SECTION     Hx: of times 2   MASS EXCISION Right 02/25/2014   Procedure: EXCISION MASS RIGHT CHEST WALL;  Surgeon: Deward Null III, MD;  Location: [facility] OR;  Service: General;  Laterality: Right;   MASTECTOMY Right    TOTAL MASTECTOMY Right 03/20/2013   Procedure: TOTAL MASTECTOMY;  Surgeon: Deward GORMAN Null DOUGLAS, MD;  Location: [facility] OR;  Service: General;  Laterality: Right;   Social History   Tobacco Use   Smoking status: Never    Passive exposure: Never   Smokeless tobacco: Never  Vaping Use   Vaping status: Never Used  Substance Use Topics   Alcohol  use: No   Drug use: No   Social History   Social History Narrative   Right handed    Caffeine use: daily   Lives with spouse.     Immunization History  Administered Date(s) Administered   Tdap 03/04/2013     Objective: Vital Signs: BP (!) 163/96 (BP Location: Left Arm, Patient Position: Sitting, Cuff Size: Normal)   Pulse (!) 55   Temp (!) 97.3 F (36.3 C)   Resp 14   Ht 5' 2 (1.575 m)   Wt 156 lb 12.8 oz (71.1 kg)   LMP 03/04/2013   BMI 28.68 kg/m    Physical Exam Vitals and nursing note reviewed.  Constitutional:      Appearance: She is well-developed.  HENT:     Head: Normocephalic and atraumatic.  Eyes:     Conjunctiva/sclera: Conjunctivae normal.  Cardiovascular:     Rate and Rhythm: Normal rate and regular rhythm.     Heart sounds: Normal heart sounds.  Pulmonary:     Effort: Pulmonary effort is  normal.     Breath sounds: Normal breath sounds.  Abdominal:     General: Bowel sounds are normal.     Palpations: Abdomen is soft.  Musculoskeletal:     Cervical back: Normal range of motion.  Lymphadenopathy:     Cervical: No cervical adenopathy.  Skin:    General: Skin is warm and dry.     Capillary Refill: Capillary refill takes less than 2 seconds.  Neurological:     Mental Status: She is alert and oriented to person, place, and time.  Psychiatric:        Behavior: Behavior normal.  Musculoskeletal Exam: Cervical, thoracic and lumbar spine were in good range of motion.  Shoulders were in good range of motion.  She had mild tenderness in the right subacromial region.  Elbow joints, wrist joints, MCPs PIPs and DIPs were in good range of motion.  She has thickening of the left CMC joint and bilateral DIP joints.  Hip joints and knee joints in good range of motion.  She had crepitus in bilateral knee joints.  Some warmth was noted on palpation of the right knee.  No effusion was noted.  There was no tenderness over ankles or MTPs.  Bilateral bunions and hammertoes were noted.  CDAI Exam: CDAI Score: -- Patient Global: --; Provider Global: -- Swollen: --; Tender: -- Joint Exam 04/17/2024   No joint exam has been documented for this visit   There is currently no information documented on the homunculus. Go to the Rheumatology activity and complete the homunculus joint exam.  Investigation: No additional findings.  Imaging: No results found.  Recent Labs: Lab Results  Component Value Date   WBC 9.3 11/14/2015   HGB 13.2 11/14/2015   PLT 350.0 11/14/2015   NA 141 02/22/2014   K 4.3 02/22/2014   CL 107 05/12/2012   CO2 23 02/22/2014   GLUCOSE 96 02/22/2014   BUN 11.9 02/22/2014   CREATININE 0.9 02/22/2014   BILITOT 0.60 02/22/2014   ALKPHOS 69 02/22/2014   AST 14 02/22/2014   ALT 17 02/22/2014   PROT 7.5 02/22/2014   ALBUMIN 3.9 02/22/2014   CALCIUM 9.4  02/22/2014   October 23, 2023 CMP AST 18 ALT 21, creatinine 0.9, GFR 62.5, CBC WBC 7.3, hemoglobin 13.7, platelets 325, lipid panel LDL 77, TSH normal, vitamin D30.6, uric acid 6.6, RF 18.3  Speciality Comments: No specialty comments available.  Procedures:  No procedures performed Allergies: Patient has no known allergies.   Assessment / Plan:     Visit Diagnoses: Polyarthralgia-patient complains of pain and discomfort in her joints for over the last 2 years.  She complains of discomfort in her shoulders and her hands.  She also has discomfort in her knee joints in her feet.  She denies history of joint swelling.  Chronic pain of both shoulders-patient states she has been having pain and discomfort in her shoulders for the last 2 years.  She has had right shoulder joint injection about 2 years by Dr. Arvell in the past which helped.  A handout on shoulder joint exercise was given.  Pain in both hands -she complains of pain and stiffness in her hands.  She had left CMC thickening.  She had bilateral DIP prominence.  Joint protection muscle strengthening was discussed.  Plan: XR Hand 2 View Right, XR Hand 2 View Left..  X-rays with history of osteoarthritis.  A handout on hand exercises was given.  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 Arvell.  I reviewed x-ray of the right knee joint from January 27, 2023 which showed mild to mild to moderate tricompartment osteoarthritis with a small  joint effusion.  She had warmth on palpation of the right knee joint..  A handout on lower extremity exercise was given.  Closed fracture of right ankle, sequela - Hairline 2018 treated with soft boot.  Patient reports that she has intermittent discomfort.  Pain in both feet -she had bilateral bunions and hammertoes.  No synovitis was noted.  Plan: XR Foot 2 Views Right, XR Foot 2 Views Left.  X-ray with history of osteoarthritis.  Rheumatoid factor  positive -she had positive rheumatoid factor at 18.3.  I will recheck rheumatoid factor and anti-CCP and will check sed rate.  Plan: Sedimentation rate, Rheumatoid factor, Cyclic citrul peptide antibody, IgG.  Her previous knee x-ray showed possible effusion.  She had warmth on palpation of her right knee joint.  Other fatigue -she gives history of fatigue.  Her vitamin D  was low.  She was advised to take vitamin D  on a regular basis.  Plan: Serum protein electrophoresis with reflex, Vitamin B12  Osteopenia of multiple sites-DEXA scan March 05, 2016 AP spine T-score -1.4, BMD 1.004  Malignant neoplasm of upper-outer quadrant of right breast in female, estrogen receptor positive (HCC) - Right breast mastectomy 2016, RTX , no CTX.  Lymphedema-right arm  Dyslipidemia  Depression with anxiety  Memory loss  BMI 28.0-28.9,adult-patient was very much concerned about her BMI.  We had a detailed discussion about dietary modifications and regular exercise.  A handout on  dietary modifications was given.  Orders: Orders Placed This Encounter  Procedures   XR Hand 2 View Right   XR Hand 2 View Left   XR Foot 2 Views Right   XR Foot 2 Views Left   Sedimentation rate   Rheumatoid factor   Cyclic citrul peptide antibody, IgG   Serum protein electrophoresis with reflex   Vitamin B12   No orders of the defined types were placed in this encounter.  Face-to-face time spent in patient was over 60 minutes.  Greater than 50% time was spent in counseling and coordination of care.  Follow-Up Instructions: Return for Arthralgia.   Maya Nash, MD  Note - This record has been created using Animal nutritionist.  Chart creation errors have been sought, but may not always  have been located. Such creation errors do not reflect on  the standard of medical care.

## 2024-04-17 ENCOUNTER — Ambulatory Visit

## 2024-04-17 ENCOUNTER — Ambulatory Visit: Attending: Rheumatology | Admitting: Rheumatology

## 2024-04-17 ENCOUNTER — Encounter: Payer: Self-pay | Admitting: Rheumatology

## 2024-04-17 VITALS — BP 163/96 | HR 55 | Temp 97.3°F | Resp 14 | Ht 62.0 in | Wt 156.8 lb

## 2024-04-17 DIAGNOSIS — R7689 Other specified abnormal immunological findings in serum: Secondary | ICD-10-CM

## 2024-04-17 DIAGNOSIS — M25511 Pain in right shoulder: Secondary | ICD-10-CM

## 2024-04-17 DIAGNOSIS — M8589 Other specified disorders of bone density and structure, multiple sites: Secondary | ICD-10-CM

## 2024-04-17 DIAGNOSIS — G8929 Other chronic pain: Secondary | ICD-10-CM

## 2024-04-17 DIAGNOSIS — C50411 Malignant neoplasm of upper-outer quadrant of right female breast: Secondary | ICD-10-CM

## 2024-04-17 DIAGNOSIS — M2550 Pain in unspecified joint: Secondary | ICD-10-CM

## 2024-04-17 DIAGNOSIS — E785 Hyperlipidemia, unspecified: Secondary | ICD-10-CM

## 2024-04-17 DIAGNOSIS — L80 Vitiligo: Secondary | ICD-10-CM

## 2024-04-17 DIAGNOSIS — M79672 Pain in left foot: Secondary | ICD-10-CM

## 2024-04-17 DIAGNOSIS — M2241 Chondromalacia patellae, right knee: Secondary | ICD-10-CM | POA: Diagnosis not present

## 2024-04-17 DIAGNOSIS — Z6828 Body mass index (BMI) 28.0-28.9, adult: Secondary | ICD-10-CM

## 2024-04-17 DIAGNOSIS — M79642 Pain in left hand: Secondary | ICD-10-CM | POA: Diagnosis not present

## 2024-04-17 DIAGNOSIS — M79671 Pain in right foot: Secondary | ICD-10-CM

## 2024-04-17 DIAGNOSIS — M79641 Pain in right hand: Secondary | ICD-10-CM

## 2024-04-17 DIAGNOSIS — R413 Other amnesia: Secondary | ICD-10-CM

## 2024-04-17 DIAGNOSIS — R058 Other specified cough: Secondary | ICD-10-CM

## 2024-04-17 DIAGNOSIS — L439 Lichen planus, unspecified: Secondary | ICD-10-CM

## 2024-04-17 DIAGNOSIS — S82891S Other fracture of right lower leg, sequela: Secondary | ICD-10-CM

## 2024-04-17 DIAGNOSIS — R5383 Other fatigue: Secondary | ICD-10-CM

## 2024-04-17 DIAGNOSIS — I890 Lymphedema, not elsewhere classified: Secondary | ICD-10-CM

## 2024-04-17 DIAGNOSIS — M25512 Pain in left shoulder: Secondary | ICD-10-CM

## 2024-04-17 DIAGNOSIS — Z170 Estrogen receptor positive status [ER+]: Secondary | ICD-10-CM

## 2024-04-17 DIAGNOSIS — F418 Other specified anxiety disorders: Secondary | ICD-10-CM

## 2024-04-17 NOTE — Patient Instructions (Addendum)
 Hand Exercises Hand exercises can be helpful for almost anyone. They can strengthen your hands and improve flexibility and movement. The exercises can also increase blood flow to the hands. These results can make your work and daily tasks easier for you. Hand exercises can be especially helpful for people who have joint pain from arthritis or nerve damage from using their hands over and over. These exercises can also help people who injure a hand. Exercises Most of these hand exercises are gentle stretching and motion exercises. It is usually safe to do them often throughout the day. Warming up your hands before exercise may help reduce stiffness. You can do this with gentle massage or by placing your hands in warm water for 10-15 minutes. It is normal to feel some stretching, pulling, tightness, or mild discomfort when you begin new exercises. In time, this will improve. Remember to always be careful and stop right away if you feel sudden, very bad pain or your pain gets worse. You want to get better and be safe. Ask your health care provider which exercises are safe for you. Do exercises exactly as told by your provider and adjust them as told. Do not begin these exercises until told by your provider. Knuckle bend or claw fist  Stand or sit with your arm, hand, and all five fingers pointed straight up. Make sure to keep your wrist straight. Gently bend your fingers down toward your palm until the tips of your fingers are touching your palm. Keep your big knuckle straight and only bend the small knuckles in your fingers. Hold this position for 10 seconds. Straighten your fingers back to your starting position. Repeat this exercise 5-10 times with each hand. Full finger fist  Stand or sit with your arm, hand, and all five fingers pointed straight up. Make sure to keep your wrist straight. Gently bend your fingers into your palm until the tips of your fingers are touching the middle of your  palm. Hold this position for 10 seconds. Extend your fingers back to your starting position, stretching every joint fully. Repeat this exercise 5-10 times with each hand. Straight fist  Stand or sit with your arm, hand, and all five fingers pointed straight up. Make sure to keep your wrist straight. Gently bend your fingers at the big knuckle, where your fingers meet your hand, and at the middle knuckle. Keep the knuckle at the tips of your fingers straight and try to touch the bottom of your palm. Hold this position for 10 seconds. Extend your fingers back to your starting position, stretching every joint fully. Repeat this exercise 5-10 times with each hand. Tabletop  Stand or sit with your arm, hand, and all five fingers pointed straight up. Make sure to keep your wrist straight. Gently bend your fingers at the big knuckle, where your fingers meet your hand, as far down as you can. Keep the small knuckles in your fingers straight. Think of forming a tabletop with your fingers. Hold this position for 10 seconds. Extend your fingers back to your starting position, stretching every joint fully. Repeat this exercise 5-10 times with each hand. Finger spread  Place your hand flat on a table with your palm facing down. Make sure your wrist stays straight. Spread your fingers and thumb apart from each other as far as you can until you feel a gentle stretch. Hold this position for 10 seconds. Bring your fingers and thumb tight together again. Hold this position for 10 seconds. Repeat  this exercise 5-10 times with each hand. Making circles  Stand or sit with your arm, hand, and all five fingers pointed straight up. Make sure to keep your wrist straight. Make a circle by touching the tip of your thumb to the tip of your index finger. Hold for 10 seconds. Then open your hand wide. Repeat this motion with your thumb and each of your fingers. Repeat this exercise 5-10 times with each hand. Thumb  motion  Sit with your forearm resting on a table and your wrist straight. Your thumb should be facing up toward the ceiling. Keep your fingers relaxed as you move your thumb. Lift your thumb up as high as you can toward the ceiling. Hold for 10 seconds. Bend your thumb across your palm as far as you can, reaching the tip of your thumb for the small finger (pinkie) side of your palm. Hold for 10 seconds. Repeat this exercise 5-10 times with each hand. Grip strengthening  Hold a stress ball or other soft ball in the middle of your hand. Slowly increase the pressure, squeezing the ball as much as you can without causing pain. Think of bringing the tips of your fingers into the middle of your palm. All of your finger joints should bend when doing this exercise. Hold your squeeze for 10 seconds, then relax. Repeat this exercise 5-10 times with each hand. Contact a health care provider if: Your hand pain or discomfort gets much worse when you do an exercise. Your hand pain or discomfort does not improve within 2 hours after you exercise. If you have either of these problems, stop doing these exercises right away. Do not do them again unless your provider says that you can. Get help right away if: You develop sudden, severe hand pain or swelling. If this happens, stop doing these exercises right away. Do not do them again unless your provider says that you can. This information is not intended to replace advice given to you by your health care provider. Make sure you discuss any questions you have with your health care provider. Document Revised: 06/26/2022 Document Reviewed: 06/26/2022 [employer] Patient Education  2024 [employer]  Exercises for Chronic Knee Pain Chronic knee pain is pain that lasts longer than 3 months. For most people with chronic knee pain, exercise and weight loss is an important part of treatment. Your health care provider may want you to focus on: Making the muscles that  support your knee stronger. This can take pressure off your knee and reduce pain. Preventing knee stiffness. How far you can move your knee, keeping it there or making it farther. Losing weight (if this applies) to take pressure off your knee, lower your risk for injury, and make it easier for you to exercise. Your provider will help you make an exercise program that fits your needs and physical abilities. Below are simple, low-impact exercises you can do at home. Ask your provider or physical therapist how often you should do your exercise program and how many times to repeat each exercise. General safety tips  Get your provider's approval before doing any exercises. Start slowly and stop any time you feel pain. Do not exercise if your knee pain is flaring up. Warm up first. Stretching a cold muscle can cause an injury. Do 5-10 minutes of easy movement or light stretching before beginning your exercises. Do 5-10 minutes of low-impact activity (like walking or cycling) before starting strengthening exercises. Contact your provider any time you have pain  during or after exercising. Exercise can cause discomfort but should not be painful. It is normal to be a little stiff or sore after exercising. Stretching and range-of-motion exercises Front thigh stretch  Stand up straight and support your body by holding on to a chair or resting one hand on a wall. With your legs straight and close together, bend one knee to lift your heel up toward your butt. Using one hand for support, grab your ankle with your free hand. Pull your foot up closer toward your butt to feel the stretch in front of your thigh. Hold the stretch for 30 seconds. Repeat __________ times. Complete this exercise __________ times a day. Back thigh stretch  Sit on the floor with your back straight and your legs out straight in front of you. Place the palms of your hands on the floor and slide them toward your feet as you bend at the  hip. Try to touch your nose to your knees and feel the stretch in the back of your thighs. Hold for 30 seconds. Repeat __________ times. Complete this exercise __________ times a day. Calf stretch  Stand facing a wall. Place the palms of your hands flat against the wall, arms extended, and lean slightly against the wall. Get into a lunge position with one leg bent at the knee and the other leg stretched out straight behind you. Keep both feet facing the wall and increase the bend in your knee while keeping the heel of the other leg flat on the ground. You should feel the stretch in your calf. Hold for 30 seconds. Repeat __________ times. Complete this exercise __________ times a day. Strengthening exercises Straight leg lift  Lie on your back with one knee bent and the other leg out straight. Slowly lift the straight leg without bending the knee. Lift until your foot is about 12 inches (30 cm) off the floor. Hold for 3-5 seconds and slowly lower your leg. Repeat __________ times. Complete this exercise __________ times a day. Single leg dip  Stand between two chairs and put both hands on the backs of the chairs for support. Extend one leg out straight with your body weight resting on the heel of the standing leg. Slowly bend your standing knee to dip your body to the level that is comfortable for you. Hold for 3-5 seconds. Repeat __________ times. Complete this exercise __________ times a day. Hamstring curls  Stand straight, knees close together, facing the back of a chair. Hold on to the back of a chair with both hands. Keep one leg straight. Bend the other knee while bringing the heel up toward the butt until the knee is bent at a 90-degree angle (right angle). Hold for 3-5 seconds. Repeat __________ times. Complete this exercise __________ times a day. Wall squat  Stand straight with your back, hips, and head against a wall. Step forward one foot at a time with your back  still against the wall. Your feet should be 2 feet (61 cm) from the wall at shoulder width. Keeping your back, hips, and head against the wall, slide down the wall to as close to a sitting position as you can get. Hold for 5-10 seconds, then slowly slide back up. Repeat __________ times. Complete this exercise __________ times a day. Step-ups  Stand in front of a sturdy platform or stool that is about 6 inches (15 cm) high. Slowly step up with your left / right foot, keeping your knee in line with your  hip and foot. Do not let your knee bend so far that you cannot see your toes. Hold on to a chair for balance, but do not use it for support. Slowly unlock your knee and lower yourself to the starting position. Repeat __________ times. Complete this exercise __________ times a day. Contact a health care provider if: Your exercises cause pain. Your pain is worse after you exercise. Your pain prevents you from doing your exercises. This information is not intended to replace advice given to you by your health care provider. Make sure you discuss any questions you have with your health care provider. Document Revised: 06/26/2022 Document Reviewed: 06/26/2022 [employer] Patient Education  2024 [employer] Shoulder Exercises Ask your health care provider which exercises are safe for you. Do exercises exactly as told by your health care provider and adjust them as directed. It is normal to feel mild stretching, pulling, tightness, or discomfort as you do these exercises. Stop right away if you feel sudden pain or your pain gets worse. Do not begin these exercises until told by your health care provider. Stretching exercises External rotation and abduction This exercise is sometimes called corner stretch. The exercise rotates your arm outward (external rotation) and moves your arm out from your body (abduction). Stand in a doorway with one of your feet slightly in front of the other. This is called a  staggered stance. If you cannot reach your forearms to the door frame, stand facing a corner of a room. Choose one of the following positions as told by your health care provider: Place your hands and forearms on the door frame above your head. Place your hands and forearms on the door frame at the height of your head. Place your hands on the door frame at the height of your elbows. Slowly move your weight onto your front foot until you feel a stretch across your chest and in the front of your shoulders. Keep your head and chest upright and keep your abdominal muscles tight. Hold for __________ seconds. To release the stretch, shift your weight to your back foot. Repeat __________ times. Complete this exercise __________ times a day. Extension, standing  Stand and hold a broomstick, a cane, or a similar object behind your back. Your hands should be a little wider than shoulder-width apart. Your palms should face away from your back. Keeping your elbows straight and your shoulder muscles relaxed, move the stick away from your body until you feel a stretch in your shoulders (extension). Avoid shrugging your shoulders while you move the stick. Keep your shoulder blades tucked down toward the middle of your back. Hold for __________ seconds. Slowly return to the starting position. Repeat __________ times. Complete this exercise __________ times a day. Range-of-motion exercises Pendulum  Stand near a wall or a surface that you can hold onto for balance. Bend at the waist and let your left / right arm hang straight down. Use your other arm to support you. Keep your back straight and do not lock your knees. Relax your left / right arm and shoulder muscles, and move your hips and your trunk so your left / right arm swings freely. Your arm should swing because of the motion of your body, not because you are using your arm or shoulder muscles. Keep moving your hips and trunk so your arm swings in the  following directions, as told by your health care provider: Side to side. Forward and backward. In clockwise and counterclockwise circles. Continue each  motion for __________ seconds, or for as long as told by your health care provider. Slowly return to the starting position. Repeat __________ times. Complete this exercise __________ times a day. Shoulder flexion, standing  Stand and hold a broomstick, a cane, or a similar object. Place your hands a little more than shoulder-width apart on the object. Your left / right hand should be palm-up, and your other hand should be palm-down. Keep your elbow straight and your shoulder muscles relaxed. Push the stick up with your healthy arm to raise your left / right arm in front of your body, and then over your head until you feel a stretch in your shoulder (flexion). Avoid shrugging your shoulder while you raise your arm. Keep your shoulder blade tucked down toward the middle of your back. Hold for __________ seconds. Slowly return to the starting position. Repeat __________ times. Complete this exercise __________ times a day. Shoulder abduction, standing  Stand and hold a broomstick, a cane, or a similar object. Place your hands a little more than shoulder-width apart on the object. Your left / right hand should be palm-up, and your other hand should be palm-down. Keep your elbow straight and your shoulder muscles relaxed. Push the object across your body toward your left / right side. Raise your left / right arm to the side of your body (abduction) until you feel a stretch in your shoulder. Do not raise your arm above shoulder height unless your health care provider tells you to do that. If directed, raise your arm over your head. Avoid shrugging your shoulder while you raise your arm. Keep your shoulder blade tucked down toward the middle of your back. Hold for __________ seconds. Slowly return to the starting position. Repeat __________ times.  Complete this exercise __________ times a day. Internal rotation  Place your left / right hand behind your back, palm-up. Use your other hand to dangle an exercise band, a broomstick, or a similar object over your shoulder. Grasp the band with your left / right hand so you are holding on to both ends. Gently pull up on the band until you feel a stretch in the front of your left / right shoulder. The movement of your arm toward the center of your body is called internal rotation. Avoid shrugging your shoulder while you raise your arm. Keep your shoulder blade tucked down toward the middle of your back. Hold for __________ seconds. Release the stretch by letting go of the band and lowering your hands. Repeat __________ times. Complete this exercise __________ times a day. Strengthening exercises External rotation  Sit in a stable chair without armrests. Secure an exercise band to a stable object at elbow height on your left / right side. Place a soft object, such as a folded towel or a small pillow, between your left / right upper arm and your body to move your elbow about 4 inches (10 cm) away from your side. Hold the end of the exercise band so it is tight and there is no slack. Keeping your elbow pressed against the soft object, slowly move your forearm out, away from your abdomen (external rotation). Keep your body steady so only your forearm moves. Hold for __________ seconds. Slowly return to the starting position. Repeat __________ times. Complete this exercise __________ times a day. Shoulder abduction  Sit in a stable chair without armrests, or stand up. Hold a __________ lb / kg weight in your left / right hand, or hold an exercise band  with both hands. Start with your arms straight down and your left / right palm facing in, toward your body. Slowly lift your left / right hand out to your side (abduction). Do not lift your hand above shoulder height unless your health care provider  tells you that this is safe. Keep your arms straight. Avoid shrugging your shoulder while you do this movement. Keep your shoulder blade tucked down toward the middle of your back. Hold for __________ seconds. Slowly lower your arm, and return to the starting position. Repeat __________ times. Complete this exercise __________ times a day. Shoulder extension  Sit in a stable chair without armrests, or stand up. Secure an exercise band to a stable object in front of you so it is at shoulder height. Hold one end of the exercise band in each hand. Straighten your elbows and lift your hands up to shoulder height. Squeeze your shoulder blades together as you pull your hands down to the sides of your thighs (extension). Stop when your hands are straight down by your sides. Do not let your hands go behind your body. Hold for __________ seconds. Slowly return to the starting position. Repeat __________ times. Complete this exercise __________ times a day. Shoulder row  Sit in a stable chair without armrests, or stand up. Secure an exercise band to a stable object in front of you so it is at chest height. Hold one end of the exercise band in each hand. Position your palms so that your thumbs are facing the ceiling (neutral position). Bend each of your elbows to a 90-degree angle (right angle) and keep your upper arms at your sides. Step back or move the chair back until the band is tight and there is no slack. Slowly pull your elbows back behind you. Hold for __________ seconds. Slowly return to the starting position. Repeat __________ times. Complete this exercise __________ times a day. Shoulder press-ups  Sit in a stable chair that has armrests. Sit upright, with your feet flat on the floor. Put your hands on the armrests so your elbows are bent and your fingers are pointing forward. Your hands should be about even with the sides of your body. Push down on the armrests and use your arms to  lift yourself off the chair. Straighten your elbows and lift yourself up as much as you comfortably can. Move your shoulder blades down, and avoid letting your shoulders move up toward your ears. Keep your feet on the ground. As you get stronger, your feet should support less of your body weight as you lift yourself up. Hold for __________ seconds. Slowly lower yourself back into the chair. Repeat __________ times. Complete this exercise __________ times a day. Wall push-ups  Stand so you are facing a stable wall. Your feet should be about one arm-length away from the wall. Lean forward and place your palms on the wall at shoulder height. Keep your feet flat on the floor as you bend your elbows and lean forward toward the wall. Hold for __________ seconds. Straighten your elbows to push yourself back to the starting position. Repeat __________ times. Complete this exercise __________ times a day. This information is not intended to replace advice given to you by your health care provider. Make sure you discuss any questions you have with your health care provider. Document Revised: 08/01/2021 Document Reviewed: 08/01/2021 [employer] Patient Education  2024 [employer]   Heart-Healthy Eating Plan Many factors influence your heart health, including eating and exercise habits.  Heart health is also called coronary health. Coronary risk increases with abnormal blood fat (lipid) levels. A heart-healthy eating plan includes limiting unhealthy fats, increasing healthy fats, limiting salt (sodium) intake, and making other diet and lifestyle changes. What is my plan? Your health care provider may recommend that: You limit your fat intake to _________% or less of your total calories each day. You limit your saturated fat intake to _________% or less of your total calories each day. You limit the amount of cholesterol in your diet to less than _________ mg per day. You limit the amount of sodium in  your diet to less than _________ mg per day. What are tips for following this plan? Cooking Cook foods using methods other than frying. Baking, boiling, grilling, and broiling are all good options. Other ways to reduce fat include: Removing the skin from poultry. Removing all visible fats from meats. Steaming vegetables in water or broth. Meal planning  At meals, imagine dividing your plate into fourths: Fill one-half of your plate with vegetables and green salads. Fill one-fourth of your plate with whole grains. Fill one-fourth of your plate with lean protein foods. Eat 2-4 cups of vegetables per day. One cup of vegetables equals 1 cup (91 g) broccoli or cauliflower florets, 2 medium carrots, 1 large bell pepper, 1 large sweet potato, 1 large tomato, 1 medium white potato, 2 cups (150 g) raw leafy greens. Eat 1-2 cups of fruit per day. One cup of fruit equals 1 small apple, 1 large banana, 1 cup (237 g) mixed fruit, 1 large orange,  cup (82 g) dried fruit, 1 cup (240 mL) 100% fruit juice. Eat more foods that contain soluble fiber. Examples include apples, broccoli, carrots, beans, peas, and barley. Aim to get 25-30 g of fiber per day. Increase your consumption of legumes, nuts, and seeds to 4-5 servings per week. One serving of dried beans or legumes equals  cup (90 g) cooked, 1 serving of nuts is  oz (12 almonds, 24 pistachios, or 7 walnut halves), and 1 serving of seeds equals  oz (8 g). Fats Choose healthy fats more often. Choose monounsaturated and polyunsaturated fats, such as olive and canola oils, avocado oil, flaxseeds, walnuts, almonds, and seeds. Eat more omega-3 fats. Choose salmon, mackerel, sardines, tuna, flaxseed oil, and ground flaxseeds. Aim to eat fish at least 2 times each week. Check food labels carefully to identify foods with trans fats or high amounts of saturated fat. Limit saturated fats. These are found in animal products, such as meats, butter, and cream. Plant  sources of saturated fats include palm oil, palm kernel oil, and coconut oil. Avoid foods with partially hydrogenated oils in them. These contain trans fats. Examples are stick margarine, some tub margarines, cookies, crackers, and other baked goods. Avoid fried foods. General information Eat more home-cooked food and less restaurant, buffet, and fast food. Limit or avoid alcohol . Limit foods that are high in added sugar and simple starches such as foods made using white refined flour (white breads, pastries, sweets). Lose weight if you are overweight. Losing just 5-10% of your body weight can help your overall health and prevent diseases such as diabetes and heart disease. Monitor your sodium intake, especially if you have high blood pressure. Talk with your health care provider about your sodium intake. Try to incorporate more vegetarian meals weekly. What foods should I eat? Fruits All fresh, canned (in natural juice), or frozen fruits. Vegetables Fresh or frozen vegetables (raw, steamed, roasted, or  grilled). Green salads. Grains Most grains. Choose whole wheat and whole grains most of the time. Rice and pasta, including brown rice and pastas made with whole wheat. Meats and other proteins Lean, well-trimmed beef, veal, pork, and lamb. Chicken and [place] without skin. All fish and shellfish. Wild duck, rabbit, pheasant, and venison. Egg whites or low-cholesterol egg substitutes. Dried beans, peas, lentils, and tofu. Seeds and most nuts. Dairy Low-fat or nonfat cheeses, including ricotta and mozzarella. Skim or 1% milk (liquid, powdered, or evaporated). Buttermilk made with low-fat milk. Nonfat or low-fat yogurt. Fats and oils Non-hydrogenated (trans-free) margarines. Vegetable oils, including soybean, sesame, sunflower, olive, avocado, peanut, safflower, corn, canola, and cottonseed. Salad dressings or mayonnaise made with a vegetable oil. Beverages Water (mineral or sparkling). Coffee  and tea. Unsweetened ice tea. Diet beverages. Sweets and desserts Sherbet, gelatin, and fruit ice. Small amounts of dark chocolate. Limit all sweets and desserts. Seasonings and condiments All seasonings and condiments. The items listed above may not be a complete list of foods and beverages you can eat. Contact a dietitian for more options. What foods should I avoid? Fruits Canned fruit in heavy syrup. Fruit in cream or butter sauce. Fried fruit. Limit coconut. Vegetables Vegetables cooked in cheese, cream, or butter sauce. Fried vegetables. Grains Breads made with saturated or trans fats, oils, or whole milk. Croissants. Sweet rolls. Donuts. High-fat crackers, such as cheese crackers and chips. Meats and other proteins Fatty meats, such as hot dogs, ribs, sausage, bacon, rib-eye roast or steak. High-fat deli meats, such as salami and bologna. Caviar. Domestic duck and goose. Organ meats, such as liver. Dairy Cream, sour cream, cream cheese, and creamed cottage cheese. Whole-milk cheeses. Whole or 2% milk (liquid, evaporated, or condensed). Whole buttermilk. Cream sauce or high-fat cheese sauce. Whole-milk yogurt. Fats and oils Meat fat, or shortening. Cocoa butter, hydrogenated oils, palm oil, coconut oil, palm kernel oil. Solid fats and shortenings, including bacon fat, salt pork, lard, and butter. Nondairy cream substitutes. Salad dressings with cheese or sour cream. Beverages Regular sodas and any drinks with added sugar. Sweets and desserts Frosting. Pudding. Cookies. Cakes. Pies. Milk chocolate or white chocolate. Buttered syrups. Full-fat ice cream or ice cream drinks. The items listed above may not be a complete list of foods and beverages to avoid. Contact a dietitian for more information. Summary Heart-healthy meal planning includes limiting unhealthy fats, increasing healthy fats, limiting salt (sodium) intake and making other diet and lifestyle changes. Lose weight if you are  overweight. Losing just 5-10% of your body weight can help your overall health and prevent diseases such as diabetes and heart disease. Focus on eating a balance of foods, including fruits and vegetables, low-fat or nonfat dairy, lean protein, nuts and legumes, whole grains, and heart-healthy oils and fats. This information is not intended to replace advice given to you by your health care provider. Make sure you discuss any questions you have with your health care provider. Document Revised: 07/17/2021 Document Reviewed: 07/17/2021 Elsevier Patient Education  2024 [employer].

## 2024-04-22 LAB — PROTEIN ELECTROPHORESIS, SERUM, WITH REFLEX
Albumin ELP: 4.6 g/dL (ref 3.8–4.8)
Alpha 1: 0.3 g/dL (ref 0.2–0.3)
Alpha 2: 0.8 g/dL (ref 0.5–0.9)
Beta 2: 0.3 g/dL (ref 0.2–0.5)
Beta Globulin: 0.5 g/dL (ref 0.4–0.6)
Gamma Globulin: 1.2 g/dL (ref 0.8–1.7)
Total Protein: 7.7 g/dL (ref 6.1–8.1)

## 2024-04-22 LAB — RHEUMATOID FACTOR: Rheumatoid fact SerPl-aCnc: 34 [IU]/mL — ABNORMAL HIGH (ref <14)

## 2024-04-22 LAB — SEDIMENTATION RATE: Sed Rate: 6 mm/h (ref 0–30)

## 2024-04-22 LAB — VITAMIN B12: Vitamin B-12: 344 pg/mL (ref 200–1100)

## 2024-04-22 LAB — CYCLIC CITRUL PEPTIDE ANTIBODY, IGG: Cyclic Citrullin Peptide Ab: <16 U

## 2024-04-23 ENCOUNTER — Ambulatory Visit: Payer: Self-pay | Admitting: Rheumatology

## 2024-04-23 NOTE — Progress Notes (Signed)
 Rheumatoid factor is low titer positive.  All other labs are within normal limits.  Will discuss results at the follow-up visit.

## 2024-04-24 NOTE — Progress Notes (Signed)
 Office Visit Note  Patient: Gina Johns             Date of Birth: [birth year]           MRN: [id number]             PCP: Avva, Ravisankar, MD Referring: Janey Santos, MD Visit Date: 04/30/2024 Occupation: SUBSTITUE TEACHER  Subjective:  Pain in multiple joints  History of Present Illness: Gina Johns is a 68 y.o. female with polyarthralgia.  She returns today after her initial evaluation on April 17, 2024.  She states she continues to have pain and discomfort in her shoulders, her wrist joints, hands, knee joints and sometimes in her ankles and her feet.  She notices some swelling in her left wrist joint.  None of the other joints are painful.  She gives history of morning stiffness.  She has difficulty climbing the stairs.  She denies any history of oral ulcers, nasal ulcers, malar rash, full sensitivity, or Raynauds or lymphadenopathy.    Activities of Daily Living:  Patient reports morning stiffness for 0 minutes.   Patient Denies nocturnal pain.  Difficulty dressing/grooming: Denies Difficulty climbing stairs: Reports Difficulty getting out of chair: Reports Difficulty using hands for taps, buttons, cutlery, and/or writing: Denies  Review of Systems  Constitutional:  Positive for fatigue.  HENT:  Negative for mouth sores and mouth dryness.   Eyes:  Negative for dryness.  Respiratory:  Positive for shortness of breath.   Cardiovascular:  Negative for chest pain and palpitations.  Gastrointestinal:  Positive for constipation and diarrhea. Negative for blood in stool.  Endocrine: Negative for increased urination.  Genitourinary:  Negative for involuntary urination.  Musculoskeletal:  Positive for joint pain, gait problem, joint pain, myalgias, muscle weakness and myalgias. Negative for joint swelling, morning stiffness and muscle tenderness.  Skin:  Positive for hair loss and sensitivity to sunlight. Negative for color change and rash.  Allergic/Immunologic:  Negative for susceptible to infections.  Neurological:  Negative for dizziness and headaches.  Hematological:  Negative for swollen glands.  Psychiatric/Behavioral:  Positive for sleep disturbance. Negative for depressed mood. The patient is nervous/anxious.     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   Lymphedema 10/19/2013   Breast cancer of upper-outer quadrant of right female breast (HCC) 03/06/2013   Dyslipidemia 01/22/2013   Right knee pain 08/26/2012    Past Medical History:  Diagnosis Date   Anxiety    Arthritis    Breast cancer (HCC) 12/07/2011   right breast 2x3 cm   Depression     sometimes   Hypercholesterolemia    PMH   Right knee pain 08/26/2012    Family History  Problem Relation Age of Onset   Hypertension Mother    Cancer Sister 26       acute leukemia   Hypertension Sister    Thyroid  disease Sister    High Cholesterol Sister    Past Surgical History:  Procedure Laterality Date   BREAST LUMPECTOMY WITH AXILLARY LYMPH NODE BIOPSY     CESAREAN SECTION     Hx: of times 2   MASS EXCISION Right 02/25/2014   Procedure: EXCISION MASS RIGHT  CHEST WALL;  Surgeon: Deward Null III, MD;  Location: [facility] OR;  Service: General;  Laterality: Right;   MASTECTOMY Right    TOTAL MASTECTOMY Right 03/20/2013   Procedure: TOTAL MASTECTOMY;  Surgeon: Deward GORMAN Null DOUGLAS, MD;  Location: [facility] OR;  Service: General;  Laterality: Right;   Social History   Tobacco Use   Smoking status: Never    Passive exposure: Never   Smokeless tobacco: Never  Vaping Use   Vaping status: Never Used  Substance Use Topics   Alcohol  use: No   Drug use: No   Social History   Social History  Narrative   Right handed    Caffeine use: daily   Lives with spouse.     Immunization History  Administered Date(s) Administered   Tdap 03/04/2013     Objective: Vital Signs: BP 129/79   Pulse 68   Temp 97.6 F (36.4 C)   Resp 16   Ht 5' 2 (1.575 m)   Wt 156 lb 9.6 oz (71 kg)   LMP 03/04/2013   BMI 28.64 kg/m    Physical Exam Vitals and nursing note reviewed.  Constitutional:      Appearance: She is well-developed.  HENT:     Head: Normocephalic and atraumatic.  Eyes:     Conjunctiva/sclera: Conjunctivae normal.  Cardiovascular:     Rate and Rhythm: Normal rate and regular rhythm.     Heart sounds: Normal heart sounds.  Pulmonary:     Effort: Pulmonary effort is normal.     Breath sounds: Normal breath sounds.  Abdominal:     General: Bowel sounds are normal.     Palpations: Abdomen is soft.  Musculoskeletal:     Cervical back: Normal range of motion.  Lymphadenopathy:     Cervical: No cervical adenopathy.  Skin:    General: Skin is warm and dry.     Capillary Refill: Capillary refill takes less than 2 seconds.  Neurological:     Mental Status: She is alert and oriented to person, place, and time.  Psychiatric:        Behavior: Behavior normal.      Musculoskeletal Exam: Cervical, thoracic and lumbar spine were in good range of motion.  There was no SI joint tenderness.  Shoulder joints, elbow joints, wrist joints, MCPs, PIPs and DIPs were in good range of motion with no synovitis.  Hip joints and knee joints were in good range of motion without any warmth swelling or effusion.  There was no tenderness over ankles or MTPs.   CDAI Exam: CDAI Score: 18  Patient Global: 40 / 100; Provider Global: 40 / 100 Swollen: 3 ; Tender: 7  Joint Exam 04/30/2024      Right  Left  Glenohumeral   Tender   Tender  Wrist     Swollen Tender  MCP 2  Swollen Tender  Swollen Tender  Knee   Tender   Tender     Investigation: No additional findings.  Imaging: XR  Foot 2 Views Left Result Date: 04/17/2024 First MTP narrowing and subluxation was noted.  PIP and DIP narrowing was noted.  Dorsal spurring was noted.  No tibiotalar or subtalar joint space narrowing was noted.  Inferior calcaneal spur was noted. Impression: These findings are suggestive of osteoarthritis of the foot.  XR Foot 2 Views Right Result Date: 04/17/2024 First MTP, PIP and DIP narrowing was noted.  No significant intertarsal narrowing was noted.  Dorsal spurring was noted.  Inferior calcaneal  spur was noted. Impression: These findings suggestive of osteoarthritis of the foot.  XR Hand 2 View Left Result Date: 04/17/2024 [facility] and DIP narrowing was noted.  Mild PIP narrowing was noted.  No MCP, intercarpal or radiocarpal joint space narrowing was noted.  No erosive changes were noted. Impression: These findings were suggestive of osteoarthritis of the hand.  XR Hand 2 View Right Result Date: 04/17/2024 [facility] and DIP narrowing was noted.  No PIP, MCP, intercarpal or radiocarpal joint space narrowing was noted.  No erosive changes were noted. Impression: These findings with history of osteoarthritis of the hand.   Recent Labs: Lab Results  Component Value Date   WBC 9.3 11/14/2015   HGB 13.2 11/14/2015   PLT 350.0 11/14/2015   NA 141 02/22/2014   K 4.3 02/22/2014   CL 107 05/12/2012   CO2 23 02/22/2014   GLUCOSE 96 02/22/2014   BUN 11.9 02/22/2014   CREATININE 0.9 02/22/2014   BILITOT 0.60 02/22/2014   ALKPHOS 69 02/22/2014   AST 14 02/22/2014   ALT 17 02/22/2014   PROT 7.7 04/17/2024   ALBUMIN 3.9 02/22/2014   CALCIUM 9.4 02/22/2014   April 17, 2024 SPEP normal, sed rate 6, RF 34, anti-CCP<16, B12 [id number]  Speciality Comments: No specialty comments available.  Procedures:  Large Joint Inj: R knee on 04/30/2024 5:07 PM Indications: pain Details: 27 G 1.5 in needle, medial approach  Arthrogram: No  Medications: 40 mg triamcinolone acetonide 40 MG/ML; 1.5 mL lidocaine  1  % Aspirate: 0 mL Outcome: tolerated well, no immediate complications  Risk of infection, tendon injury, nerve injury, dermal atrophy and hypopigmentation were discussed. Procedure, treatment alternatives, risks and benefits explained, specific risks discussed. Consent was given by the patient. Immediately prior to procedure a time out was called to verify the correct patient, procedure, equipment, support staff and site/side marked as required. Patient was prepped and draped in the usual sterile fashion.     Allergies: Patient has no known allergies.   Assessment / Plan:     Visit Diagnoses: Rheumatoid arthritis with rheumatoid factor of multiple sites without organ or systems involvement (HCC) -positive RF, negative anti-CCP, inflammatory arthritis.  Synovitis noted in the left wrist and over MCP joints.  She also has pain in multiple joints including shoulders, hands, knees and feet over the years.  Detailed counsel regarding rheumatoid arthritis was provided.  A handout on rheumatoid arthritis was placed in the AVS.  Different treatment options and their side effects were discussed.  After reviewing indications side effects contraindications of hydroxychloroquine we decided to proceed with hydroxychloroquine.  Handout was given and consent was taken.  She will be starting on hydroxychloroquine 200 mg p.o. twice daily Monday to Friday once the lab results are available.  Patient was counseled on the purpose, proper use, and adverse effects of hydroxychloroquine including nausea/diarrhea, skin rash, headaches, and sun sensitivity.  Advised patient to wear sunscreen once starting hydroxychloroquine to reduce risk of rash associated with sun sensitivity.  Discussed importance of annual eye exams while on hydroxychloroquine to monitor to ocular toxicity and discussed importance of frequent laboratory monitoring.  Provided patient with eye exam form for baseline ophthalmologic exam.  Provided patient with  educational materials on hydroxychloroquine and answered all questions.  Patient consented to hydroxychloroquine. Will upload consent in the media tab.    Reviewed risk for QTC prolongation when used in combination with other QTc prolonging agents (including but not limited to antiarrhythmics, macrolide antibiotics, flouroquinolone antibiotics, haloperidol, quetiapine,  olanzapine, risperidone, droperidol, ziprasidone, amitriptyline, citalopram, ondansetron , migraine triptans, and methadone).   High risk medication use -hydroxychloroquine 200 mg p.o. twice daily Monday to Friday plan: CBC with Differential/Platelet, Comprehensive metabolic panel with GFR, Glucose 6 phosphate dehydrogenase.  Information on immunization was placed in the AVS.  She was advised to have baseline eye examination and then annual examination to screen for ocular toxicity.  She was to Dr. Cleatus.  Chronic pain of both shoulders - Pain for the last 2 years.  Right shoulder injection by Dr. Arvell 2 years ago.  Pain in both hands -she had synovitis in her left wrist and bilateral second MCPs today.  Clinical and radiographic findings with history of osteoarthritis.  Chondromalacia of patella, right -she continues to have pain and discomfort in her right knee joint.  Patient requested a cortisone injection.  She  had cortisone injection by Dr. Arvell in the past.  After informed consent was obtained and side effects were discussed right knee joint was p prepped in sterile fashion and injected with lidocaine  and Kenalog as described above.  Patient tolerated the procedure well.  Postprocedure instructions were given.  [facility] x-rays from August 2024 showed moderate osteoarthritis and chondromalacia patella.  Closed fracture of right ankle, sequela - History of hairline fracture 2018.  Pain in both feet -she has intermittent discomfort in her feet.  No synovitis was noted.  Clinical and radiographic findings with history of  osteoarthritis.  Bilateral bunions and hammertoes were noted.  Other fatigue-possibly related to rheumatoid arthritis.  Osteopenia of multiple sites - March 05, 2016 T-score -1.4, BMD 1.004.  Calcium rich diet and vitamin D  was discussed.  Need for regular exercise was emphasized.  Malignant neoplasm of upper-outer quadrant of right breast in female, estrogen receptor positive (HCC) - Right mastectomy 2016, status postradiation therapy.  Patient received no chemotherapy.  Lymphedema-right arm  Dyslipidemia  Depression with anxiety  Memory loss  BMI 28.0-28.9,adult-patient is concerned about her weight.  Weight loss diet and exercise was discussed.  Referral to weight management was discussed.  Patient will let us  know when she is ready.  Orders: Orders Placed This Encounter  Procedures   Large Joint Inj   CBC with Differential/Platelet   Comprehensive metabolic panel with GFR   Glucose 6 phosphate dehydrogenase   No orders of the defined types were placed in this encounter.  Face-to-face time spent patient was over 50 minutes.  More than 50% time was spent in counseling and coordination of care.  Follow-Up Instructions: Return in about 3 months (around 07/31/2024) for Rheumatoid arthritis, Osteoarthritis.   Maya Nash, MD  Note - This record has been created using Animal nutritionist.  Chart creation errors have been sought, but may not always  have been located. Such creation errors do not reflect on  the standard of medical care.

## 2024-04-30 ENCOUNTER — Encounter: Payer: Self-pay | Admitting: Rheumatology

## 2024-04-30 ENCOUNTER — Ambulatory Visit: Attending: Rheumatology | Admitting: Rheumatology

## 2024-04-30 ENCOUNTER — Telehealth: Payer: Self-pay | Admitting: Pharmacist

## 2024-04-30 VITALS — BP 129/79 | HR 68 | Temp 97.6°F | Resp 16 | Ht 62.0 in | Wt 156.6 lb

## 2024-04-30 DIAGNOSIS — M79641 Pain in right hand: Secondary | ICD-10-CM

## 2024-04-30 DIAGNOSIS — F418 Other specified anxiety disorders: Secondary | ICD-10-CM

## 2024-04-30 DIAGNOSIS — M25511 Pain in right shoulder: Secondary | ICD-10-CM | POA: Diagnosis not present

## 2024-04-30 DIAGNOSIS — S82891S Other fracture of right lower leg, sequela: Secondary | ICD-10-CM

## 2024-04-30 DIAGNOSIS — M0579 Rheumatoid arthritis with rheumatoid factor of multiple sites without organ or systems involvement: Secondary | ICD-10-CM | POA: Diagnosis not present

## 2024-04-30 DIAGNOSIS — Z79899 Other long term (current) drug therapy: Secondary | ICD-10-CM

## 2024-04-30 DIAGNOSIS — E785 Hyperlipidemia, unspecified: Secondary | ICD-10-CM

## 2024-04-30 DIAGNOSIS — M2550 Pain in unspecified joint: Secondary | ICD-10-CM

## 2024-04-30 DIAGNOSIS — M2241 Chondromalacia patellae, right knee: Secondary | ICD-10-CM

## 2024-04-30 DIAGNOSIS — R5383 Other fatigue: Secondary | ICD-10-CM

## 2024-04-30 DIAGNOSIS — M79671 Pain in right foot: Secondary | ICD-10-CM

## 2024-04-30 DIAGNOSIS — C50411 Malignant neoplasm of upper-outer quadrant of right female breast: Secondary | ICD-10-CM

## 2024-04-30 DIAGNOSIS — M8589 Other specified disorders of bone density and structure, multiple sites: Secondary | ICD-10-CM

## 2024-04-30 DIAGNOSIS — Z6828 Body mass index (BMI) 28.0-28.9, adult: Secondary | ICD-10-CM

## 2024-04-30 DIAGNOSIS — M79642 Pain in left hand: Secondary | ICD-10-CM

## 2024-04-30 DIAGNOSIS — M25512 Pain in left shoulder: Secondary | ICD-10-CM

## 2024-04-30 DIAGNOSIS — R413 Other amnesia: Secondary | ICD-10-CM

## 2024-04-30 DIAGNOSIS — M79672 Pain in left foot: Secondary | ICD-10-CM

## 2024-04-30 DIAGNOSIS — R7689 Other specified abnormal immunological findings in serum: Secondary | ICD-10-CM

## 2024-04-30 DIAGNOSIS — Z170 Estrogen receptor positive status [ER+]: Secondary | ICD-10-CM

## 2024-04-30 DIAGNOSIS — G8929 Other chronic pain: Secondary | ICD-10-CM

## 2024-04-30 DIAGNOSIS — I890 Lymphedema, not elsewhere classified: Secondary | ICD-10-CM

## 2024-04-30 MED ORDER — TRIAMCINOLONE ACETONIDE 40 MG/ML IJ SUSP
40.0000 mg | INTRAMUSCULAR | Status: AC | PRN
  Administered 2024-04-30: 40 mg via INTRA_ARTICULAR

## 2024-04-30 MED ORDER — LIDOCAINE HCL 1 % IJ SOLN
1.5000 mL | INTRAMUSCULAR | Status: AC | PRN
  Administered 2024-04-30: 1.5 mL

## 2024-04-30 NOTE — Telephone Encounter (Signed)
 Pending baseline labs (CBC/CMP), patient will be hydroxychloroquine new start  Dose: 200mg  twice daily Mondays through Fridays  Repeat CBC/CMP in 1 month, 3 months, every 5 months  Sherry Pennant, PharmD, MPH, BCPS, CPP Clinical Pharmacist [facility])

## 2024-04-30 NOTE — Progress Notes (Signed)
 Pharmacy Note  Subjective: Patient presents today to [facility] for follow up office visit.   Patient seen by the pharmacist for counseling on hydroxychloroquine for rheumatoid arthritis.    Objective: CMP     Component Value Date/Time   NA 141 02/22/2014 1011   K 4.3 02/22/2014 1011   CL 107 05/12/2012 1441   CO2 23 02/22/2014 1011   GLUCOSE 96 02/22/2014 1011   GLUCOSE 114 (H) 05/12/2012 1441   BUN 11.9 02/22/2014 1011   CREATININE 0.9 02/22/2014 1011   CALCIUM 9.4 02/22/2014 1011   PROT 7.7 04/17/2024 0936   PROT 7.5 02/22/2014 1011   ALBUMIN 3.9 02/22/2014 1011   AST 14 02/22/2014 1011   ALT 17 02/22/2014 1011   ALKPHOS 69 02/22/2014 1011   BILITOT 0.60 02/22/2014 1011    CBC    Component Value Date/Time   WBC 9.3 11/14/2015 1726   RBC 5.25 (H) 11/14/2015 1726   HGB 13.2 11/14/2015 1726   HGB 13.2 02/22/2014 1010   HCT 39.7 11/14/2015 1726   HCT 40.7 02/22/2014 1010   PLT 350.0 11/14/2015 1726   PLT 243 02/22/2014 1010   MCV 75.7 (L) 11/14/2015 1726   MCV 78.4 (L) 02/22/2014 1010   MCH 25.4 02/22/2014 1010   MCHC 33.3 11/14/2015 1726   RDW 14.2 11/14/2015 1726   RDW 13.3 02/22/2014 1010   LYMPHSABS 2.9 11/14/2015 1726   LYMPHSABS 2.1 02/22/2014 1010   MONOABS 0.7 11/14/2015 1726   MONOABS 0.4 02/22/2014 1010   EOSABS 0.2 11/14/2015 1726   EOSABS 0.4 02/22/2014 1010   BASOSABS 0.0 11/14/2015 1726   BASOSABS 0.0 02/22/2014 1010    Assessment/Plan: Patient was counseled on the purpose, proper use, and adverse effects of hydroxychloroquine including nausea/diarrhea, skin rash, headaches, and sun sensitivity.  Advised patient to wear sunscreen once starting hydroxychloroquine to reduce risk of rash associated with sun sensitivity.  Discussed importance of annual eye exams while on hydroxychloroquine to monitor to ocular toxicity and discussed importance of frequent laboratory monitoring.  Provided patient with eye exam form for baseline  ophthalmologic exam.  Provided patient with educational materials on hydroxychloroquine and answered all questions.  Patient consented to hydroxychloroquine. Will upload consent in the media tab.    Reviewed risk for QTC prolongation when used in combination with other QTc prolonging agents (including but not limited to antiarrhythmics, macrolide antibiotics, flouroquinolone antibiotics, haloperidol, quetiapine, olanzapine, risperidone, droperidol, ziprasidone, amitriptyline, citalopram, ondansetron , migraine triptans, and methadone).   Hydroxychloroquine dose will be 200 mg twice daily Mondays through Fridays only. .  Prescription pending lab results. Patient to come back tomorrow for labs.  Sherry Pennant, PharmD, MPH, BCPS, CPP Clinical Pharmacist [facility])

## 2024-04-30 NOTE — Patient Instructions (Signed)
 Rheumatoid Arthritis Rheumatoid arthritis (RA) is a long-term (chronic) disease that causes inflammation in the joints. RA may start slowly. It most often affects the small joints of the hands and feet. Usually, the same joints are affected on both sides of the body. Inflammation from RA can also affect other parts of the body, including the heart, eyes, or lungs. There is no cure for RA, but medicines can help your symptoms and stop or slow down the progression of the disease. What are the causes? RA is an autoimmune disease. When you have an autoimmune disease, your body's defense system (immune system) mistakenly attacks healthy body tissues. The exact cause of RA is not known. What increases the risk? The following factors may make you more likely to develop this condition: Being female. Having a family history of RA or other autoimmune diseases. Having a history of smoking. Being obese. Having been exposed to pollutants or chemicals. What are the signs or symptoms? Symptoms of this condition usually start gradually. They are often worse in the morning. The first symptom may be morning stiffness that lasts longer than 30 minutes. As RA progresses, symptoms may include: Pain, stiffness, swelling, warmth, and tenderness in joints on both sides of your body. Loss of energy. Loss of appetite. Weight loss. Low-grade fever. Dry eyes and dry mouth. Firm lumps (rheumatoid nodules) that grow beneath your skin in areas such as your forearm bones near your elbows and on your hands. Changes in the appearance of joints (deformity) and loss of joint function. Symptoms of this condition vary from person to person. Symptoms of RA often come and go. Sometimes, symptoms get worse for a period of time. These are called flares. How is this diagnosed? This condition is diagnosed based on your symptoms, medical history, and a physical exam. You may have X-rays or an MRI to check for the type of joint  changes that are caused by RA. You may also have blood tests to look for: Proteins (antibodies) that your immune system may make if you have RA. These include rheumatoid factor (RF) and anti-CCP. When blood tests show these proteins, you are said to have seropositive RA. When blood tests do not show these proteins, you may have seronegative RA. Inflammation in your blood. A low number of red blood cells (anemia). How is this treated? The goals of treatment are to relieve pain, reduce inflammation, and slow down or stop joint damage and disability. Treatment may include: Lifestyle changes. It is important to rest as needed, eat a healthy diet, and exercise. Medicines. Your health care provider may adjust your medicines every 3 months until treatment goals are reached. Common medicines include: Pain relievers (analgesics). Corticosteroids and NSAIDs, such as ibuprofen, to reduce inflammation. Disease-modifying antirheumatic drugs (DMARDs) to try to slow the course of the disease. Biologic response modifiers to reduce inflammation and damage. Physical therapy and occupational therapy. Surgery, if you have severe joint damage. Joint replacement or fusing of joints may be needed. Your health care provider will work with you to identify the best treatment option for you based on assessment of the overall disease activity in your body. Follow these instructions at home: Managing pain, stiffness, and swelling If directed, apply heat to the affected area as often as told by your health care provider. Use the heat source that your health care provider recommends, such as a moist heat pack or a heating pad. Place a towel between your skin and the heat source. Leave the heat on for  20-30 minutes. Remove the heat if your skin turns bright red. This is especially important if you are unable to feel pain, heat, or cold. You have a greater risk of getting burned.  Activity Return to your normal  activities as told by your health care provider. Ask your health care provider what activities are safe for you. Rest when you are having a flare. Start an exercise program as told by your health care provider. This may include physical therapy exercises to maintain movement and strength in your joints. General instructions Take over-the-counter and prescription medicines only as told by your health care provider. Keep all follow-up visits. This is important. Where to find more information [employer]: rheumatology.org Arthritis Foundation: arthritis.org Contact a health care provider if: You have a flare-up of RA symptoms. You have a fever. You have side effects from your medicines. Get help right away if: You have chest pain. You have trouble breathing. You quickly develop a hot, painful joint that is more severe than your usual joint aches. These symptoms may be an emergency. Get help right away. Call 911. Do not wait to see if the symptoms will go away. Do not drive yourself to the hospital. Summary Rheumatoid arthritis (RA) is a long-term (chronic) disease that causes inflammation in the joints. RA is an autoimmune disease. The goals of treatment are to relieve pain, reduce inflammation, and slow down or stop joint damage and disability. This information is not intended to replace advice given to you by your health care provider. Make sure you discuss any questions you have with your health care provider. Document Revised: 04/13/2021 Document Reviewed: 04/13/2021 Elsevier Patient Education  2024 [employer]  Hydroxychloroquine Tablets What is this medication? HYDROXYCHLOROQUINE (hye drox ee KLOR oh kwin) treats autoimmune conditions, such as rheumatoid arthritis and lupus. It works by slowing down an overactive immune system. It may also be used to prevent and treat malaria. It works by killing the parasite that causes malaria. It belongs to a group of  medications called DMARDs. This medicine may be used for other purposes; ask your health care provider or pharmacist if you have questions. COMMON BRAND NAME(S): Plaquenil, Quineprox, SOVUNA What should I tell my care team before I take this medication? They need to know if you have any of these conditions: Diabetes Eye disease, vision problems Frequently drink alcohol  G6PD deficiency Heart disease Irregular heartbeat or rhythm Kidney disease Liver disease Porphyria Psoriasis An unusual or allergic reaction to hydroxychloroquine, other medications, foods, dyes, or preservatives Pregnant or trying to get pregnant Breastfeeding How should I use this medication? Take this medication by mouth with water. Take it as directed on the prescription label. Do not cut, crush, or chew this medication. Swallow the tablets whole. Take it with food. Do not take it more than directed. Take all of this medication unless your care team tells you to stop it early. Keep taking it even if you think you are better. Take products with antacids in them at a different time of day than this medication. Take this medication 4 hours before or 4 hours after antacids. Talk to your care team if you have questions. Talk to your care team about the use of this medication in children. While this medication may be prescribed for selected conditions, precautions do apply. Overdosage: If you think you have taken too much of this medicine contact a poison control center or emergency room at once. NOTE: This medicine is only for you. Do  not share this medicine with others. What if I miss a dose? If you miss a dose, take it as soon as you can. If it is almost time for your next dose, take only that dose. Do not take double or extra doses. What may interact with this medication? Do not take this medication with any of the following: Cisapride Dronedarone Pimozide Thioridazine This medication may also interact with the  following: Ampicillin Antacids Cimetidine Cyclosporine Digoxin Kaolin Medications for diabetes, such as insulin, glipizide, glyburide Medications for seizures, such as carbamazepine, phenobarbital, phenytoin Mefloquine Methotrexate Other medications that cause heart rhythm changes Praziquantel This list may not describe all possible interactions. Give your health care provider a list of all the medicines, herbs, non-prescription drugs, or dietary supplements you use. Also tell them if you smoke, drink alcohol , or use illegal drugs. Some items may interact with your medicine. What should I watch for while using this medication? Visit your care team for regular checks on your progress. Tell your care team if your symptoms do not start to get better or if they get worse. You may need blood work done while you are taking this medication. If you take other medications that can affect heart rhythm, you may need more testing. Talk to your care team if you have questions. Your vision may be tested before and during use of this medication. Tell your care team right away if you have any change in your eyesight. This medication may cause serious skin reactions. They can happen weeks to months after starting the medication. Contact your care team right away if you notice fevers or flu-like symptoms with a rash. The rash may be red or purple and then turn into blisters or peeling of the skin. Or, you might notice a red rash with swelling of the face, lips or lymph nodes in your neck or under your arms. If you or your family notice any changes in your behavior, such as new or worsening depression, thoughts of harming yourself, anxiety, or other unusual or disturbing thoughts, or memory loss, call your care team right away. What side effects may I notice from receiving this medication? Side effects that you should report to your care team as soon as possible: Allergic reactions--skin rash, itching, hives,  swelling of the face, lips, tongue, or throat Aplastic anemia--unusual weakness or fatigue, dizziness, headache, trouble breathing, increased bleeding or bruising Change in vision Heart rhythm changes--fast or irregular heartbeat, dizziness, feeling faint or lightheaded, chest pain, trouble breathing Infection--fever, chills, cough, or sore throat Low blood sugar (hypoglycemia)--tremors or shaking, anxiety, sweating, cold or clammy skin, confusion, dizziness, rapid heartbeat Muscle injury--unusual weakness or fatigue, muscle pain, dark yellow or brown urine, decrease in amount of urine Pain, tingling, or numbness in the hands or feet Rash, fever, and swollen lymph nodes Redness, blistering, peeling, or loosening of the skin, including inside the mouth Thoughts of suicide or self-harm, worsening mood, or feelings of depression Unusual bruising or bleeding Side effects that usually do not require medical attention (report to your care team if they continue or are bothersome): Diarrhea Headache Nausea Stomach pain Vomiting This list may not describe all possible side effects. Call your doctor for medical advice about side effects. You may report side effects to FDA at 1-800-FDA-1088. Where should I keep my medication? Keep out of the reach of children and pets. Store at room temperature up to 30 degrees C (86 degrees F). Protect from light. Get rid of any unused medication after  the expiration date. To get rid of medications that are no longer needed or have expired: Take the medication to a medication take-back program. Check with your pharmacy or law enforcement to find a location. If you cannot return the medication, check the label or package insert to see if the medication should be thrown out in the garbage or flushed down the toilet. If you are not sure, ask your care team. If it is safe to put it in the trash, empty the medication out of the container. Mix the medication with cat litter,  dirt, coffee grounds, or other unwanted substance. Seal the mixture in a bag or container. Put it in the trash. NOTE: This sheet is a summary. It may not cover all possible information. If you have questions about this medicine, talk to your doctor, pharmacist, or health care provider.  2024 Elsevier/Gold Standard (2021-12-18 00:00:00)  Standing Labs We placed an order today for your standing lab work.   Please have your standing labs drawn in 1 month after starting hydroxychloroquine, then 3 months and then every 5 months  Please have your labs drawn 2 weeks prior to your appointment so that the provider can discuss your lab results at your appointment, if possible.  Please note that you may see your imaging and lab results in MyChart before we have reviewed them. We will contact you once all results are reviewed. Please allow our office up to 72 hours to thoroughly review all of the results before contacting the office for clarification of your results.  WALK-IN LAB HOURS  Monday through Thursday from 8:00 am -12:30 pm and 1:00 pm-4:30 pm and Friday from 8:00 am-12:00 pm.  Patients with office visits requiring labs will be seen before walk-in labs.  You may encounter longer than normal wait times. Please allow additional time. Wait times may be shorter on  Monday and Thursday afternoons.  We do not book appointments for walk-in labs. We appreciate your patience and understanding with our staff.   Labs are drawn by [facility]. Please bring your co-pay at the time of your lab draw.  You may receive a bill from [facility] for your lab work.  Please note if you are on Hydroxychloroquine and and an order has been placed for a Hydroxychloroquine level,  you will need to have it drawn 4 hours or more after your last dose.  If you wish to have your labs drawn at another location, please call the office 24 hours in advance so we can fax the orders.  The office is located at [address], [place], [place] [address]   If you have any questions regarding directions or hours of operation,  please call [phone].   As a reminder, please drink plenty of water prior to coming for your lab work. Thanks!   Vaccines You are taking a medication(s) that can suppress your immune system.  The following immunizations are recommended: Flu annually (high dose) RSV Covid-19  Td/Tdap (tetanus, diphtheria, pertussis) every 10 years Pneumonia (Prevnar 15 then Pneumovax 23 at least 1 year apart.  Alternatively, can take Prevnar 20 without needing additional dose) Shingrix: 2 doses from 4 weeks to 6 months apart  Please check with your PCP to make sure you are up to date.   He is get a baseline eye examination and then annual eye examination to your ophthalmologist

## 2024-05-01 ENCOUNTER — Other Ambulatory Visit: Payer: Self-pay | Admitting: Rheumatology

## 2024-05-04 ENCOUNTER — Telehealth: Payer: Self-pay | Admitting: Rheumatology

## 2024-05-04 ENCOUNTER — Ambulatory Visit: Payer: Self-pay | Admitting: Rheumatology

## 2024-05-04 ENCOUNTER — Other Ambulatory Visit: Payer: Self-pay

## 2024-05-04 DIAGNOSIS — Z79899 Other long term (current) drug therapy: Secondary | ICD-10-CM

## 2024-05-04 NOTE — Progress Notes (Unsigned)
 Per lab note:  MCV is low.  Patient should take multivitamin with iron.  Glucose is mildly elevated because it was not a fasting sample.  Okay to send a prescription for hydroxychloroquine 200 mg p.o. twice daily Monday to Friday.  Labs in 1 month, 3 months and then every 5 months      Please review and sign pended plaquenil rx and standing lab orders. Thanks!

## 2024-05-04 NOTE — Progress Notes (Signed)
 MCV is low.  Patient should take multivitamin with iron.  Glucose is mildly elevated because it was not a fasting sample.  Okay to send a prescription for hydroxychloroquine 200 mg p.o. twice daily Monday to Friday.  Labs in 1 month, 3 months and then every 5 months

## 2024-05-04 NOTE — Telephone Encounter (Signed)
 Patient stated she was returning a call and if we could call her back today.

## 2024-05-04 NOTE — Telephone Encounter (Signed)
 Spoke with the patient, see lab note for details.

## 2024-05-05 MED ORDER — HYDROXYCHLOROQUINE SULFATE 200 MG PO TABS
ORAL_TABLET | ORAL | 0 refills | Status: AC
Start: 2024-05-05 — End: ?

## 2024-05-09 LAB — COMPREHENSIVE METABOLIC PANEL WITH GFR
AG Ratio: 1.6 (calc) (ref 1.0–2.5)
ALT: 14 U/L (ref 6–29)
AST: 14 U/L (ref 10–35)
Albumin: 4.4 g/dL (ref 3.6–5.1)
Alkaline phosphatase (APISO): 57 U/L (ref 37–153)
BUN: 14 mg/dL (ref 7–25)
CO2: 25 mmol/L (ref 20–32)
Calcium: 9.4 mg/dL (ref 8.6–10.4)
Chloride: 101 mmol/L (ref 98–110)
Creat: 0.69 mg/dL (ref 0.50–1.05)
Globulin: 2.8 g/dL (ref 1.9–3.7)
Glucose, Bld: 103 mg/dL — ABNORMAL HIGH (ref 65–99)
Potassium: 4.8 mmol/L (ref 3.5–5.3)
Sodium: 135 mmol/L (ref 135–146)
Total Bilirubin: 0.8 mg/dL (ref 0.2–1.2)
Total Protein: 7.2 g/dL (ref 6.1–8.1)
eGFR: 95 mL/min/1.73m2 (ref >=60)

## 2024-05-09 LAB — CBC WITH DIFFERENTIAL/PLATELET
Absolute Lymphocytes: 1538 {cells}/uL (ref 850–3900)
Absolute Monocytes: 469 {cells}/uL (ref 200–950)
Basophils Absolute: 20 {cells}/uL (ref 0–200)
Basophils Relative: 0.3 %
Eosinophils Absolute: 99 {cells}/uL (ref 15–500)
Eosinophils Relative: 1.5 %
HCT: 40.8 % (ref 35.0–45.0)
Hemoglobin: 13.3 g/dL (ref 11.7–15.5)
MCH: 25.6 pg — ABNORMAL LOW (ref 27.0–33.0)
MCHC: 32.6 g/dL (ref 32.0–36.0)
MCV: 78.6 fL — ABNORMAL LOW (ref 80.0–100.0)
MPV: 9.6 fL (ref 7.5–12.5)
Monocytes Relative: 7.1 %
Neutro Abs: 4475 {cells}/uL (ref 1500–7800)
Neutrophils Relative %: 67.8 %
Platelets: 295 Thousand/uL (ref 140–400)
RBC: 5.19 Million/uL — ABNORMAL HIGH (ref 3.80–5.10)
RDW: 12.9 % (ref 11.0–15.0)
Total Lymphocyte: 23.3 %
WBC: 6.6 Thousand/uL (ref 3.8–10.8)

## 2024-05-09 LAB — GLUCOSE 6 PHOSPHATE DEHYDROGENASE: G-6PDH: 15.1 U/g{Hb} (ref 7.0–20.5)

## 2024-05-11 NOTE — Telephone Encounter (Signed)
 Prescription sent 05/05/2024

## 2024-05-25 ENCOUNTER — Other Ambulatory Visit: Payer: Self-pay | Admitting: Cardiology

## 2024-05-25 DIAGNOSIS — E7800 Pure hypercholesterolemia, unspecified: Secondary | ICD-10-CM

## 2024-05-28 ENCOUNTER — Other Ambulatory Visit: Payer: Self-pay | Admitting: Cardiology

## 2024-05-28 DIAGNOSIS — E7800 Pure hypercholesterolemia, unspecified: Secondary | ICD-10-CM

## 2024-05-28 MED ORDER — EZETIMIBE-SIMVASTATIN 10-40 MG PO TABS: 1.0000 | ORAL_TABLET | Freq: Every evening | ORAL | 0 refills | Status: AC

## 2024-06-29 ENCOUNTER — Other Ambulatory Visit: Payer: Self-pay | Admitting: Cardiology

## 2024-06-29 DIAGNOSIS — I700 Atherosclerosis of aorta: Secondary | ICD-10-CM

## 2024-08-13 ENCOUNTER — Ambulatory Visit: Admitting: Rheumatology

## 2024-09-21 ENCOUNTER — Ambulatory Visit: Admitting: Rheumatology
